# Patient Record
Sex: Male | Born: 1975 | Race: Black or African American | Hispanic: No | Marital: Single | Smoking: Current every day smoker
Health system: Southern US, Community
[De-identification: ages and names within clinical notes are randomized; demographics above are authoritative.]

## PROBLEM LIST (undated history)

## (undated) DIAGNOSIS — G43909 Migraine, unspecified, not intractable, without status migrainosus: Secondary | ICD-10-CM

## (undated) HISTORY — PX: LYMPH NODE DISSECTION: SHX5087

## (undated) HISTORY — PX: HERNIA REPAIR: SHX51

---

## 2009-07-31 ENCOUNTER — Emergency Department: Payer: Self-pay | Admitting: Emergency Medicine

## 2009-07-31 IMAGING — CR DG CHEST 2V
1 series · 2 of 2 positions shown · non-contrast
Comparison: none

REASON FOR EXAM: chest pain
COMMENTS:   May transport without cardiac monitor

[Series 1: view not recorded · 0.17mm/px · 2 of 2 slices shown]
[im 1/2]
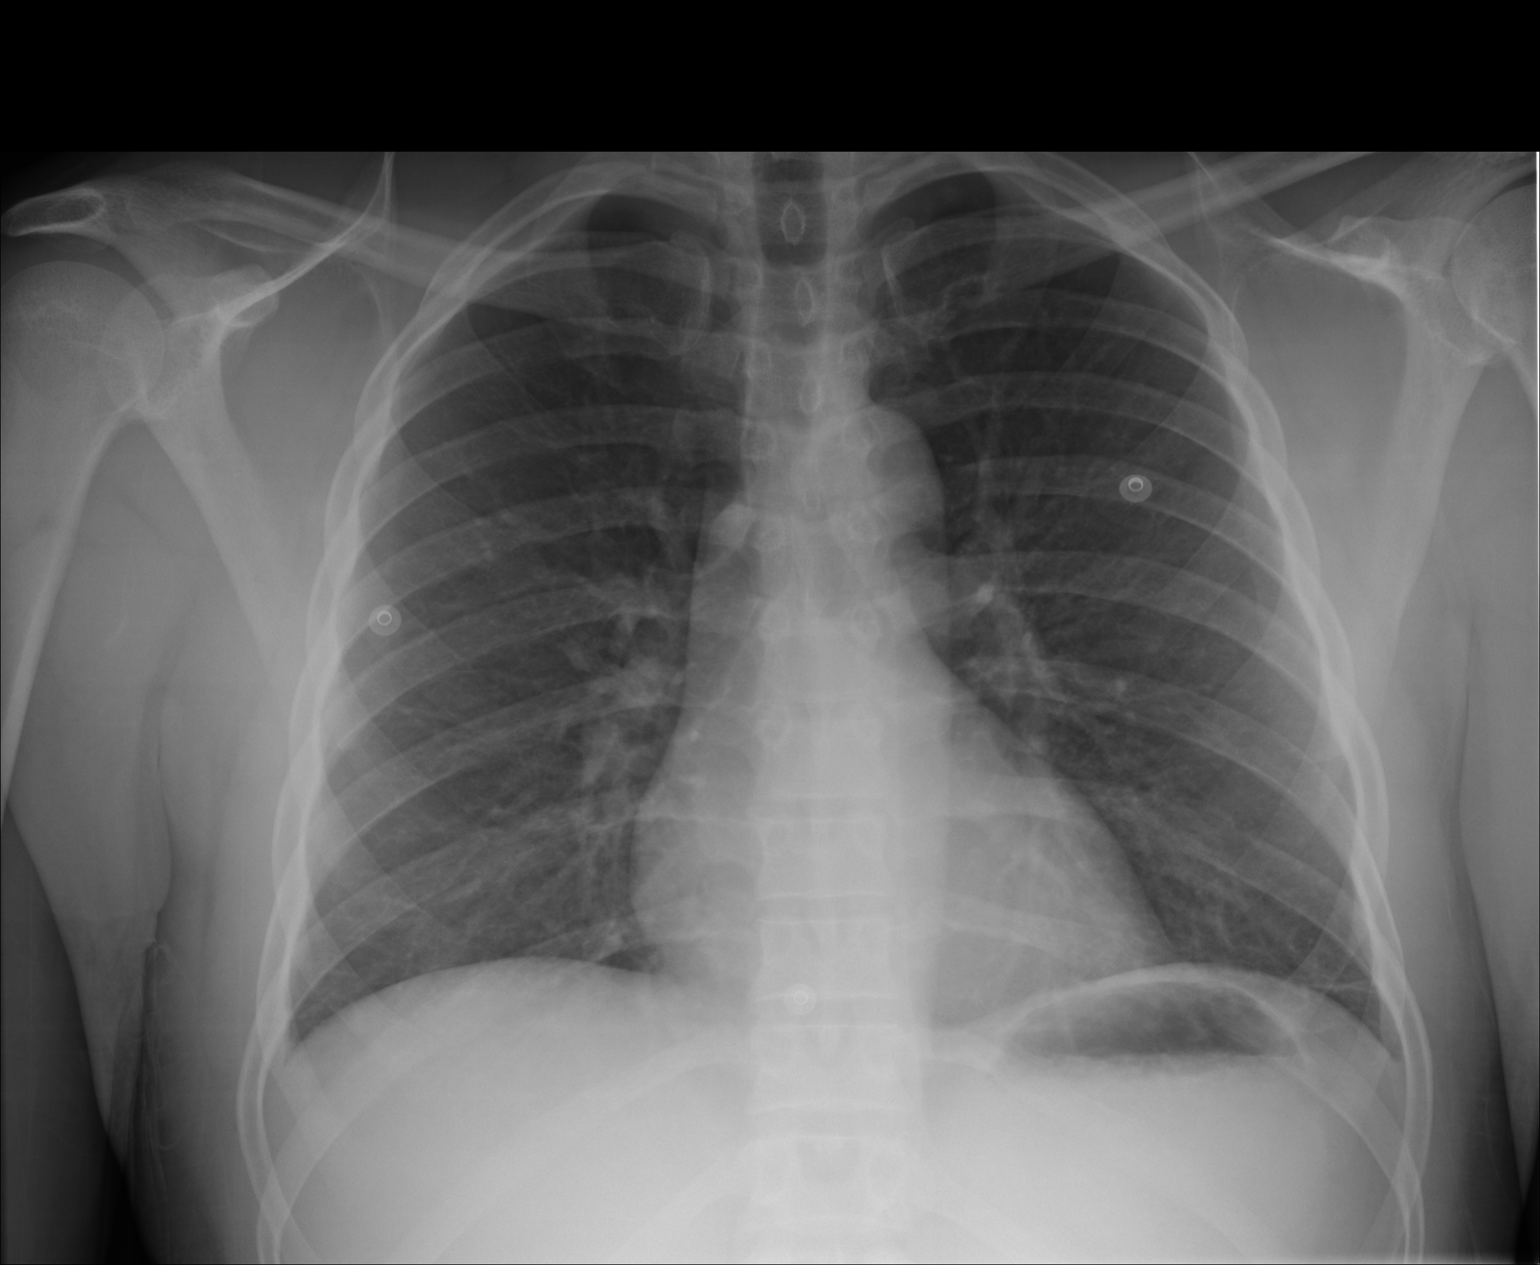
[im 2/2]
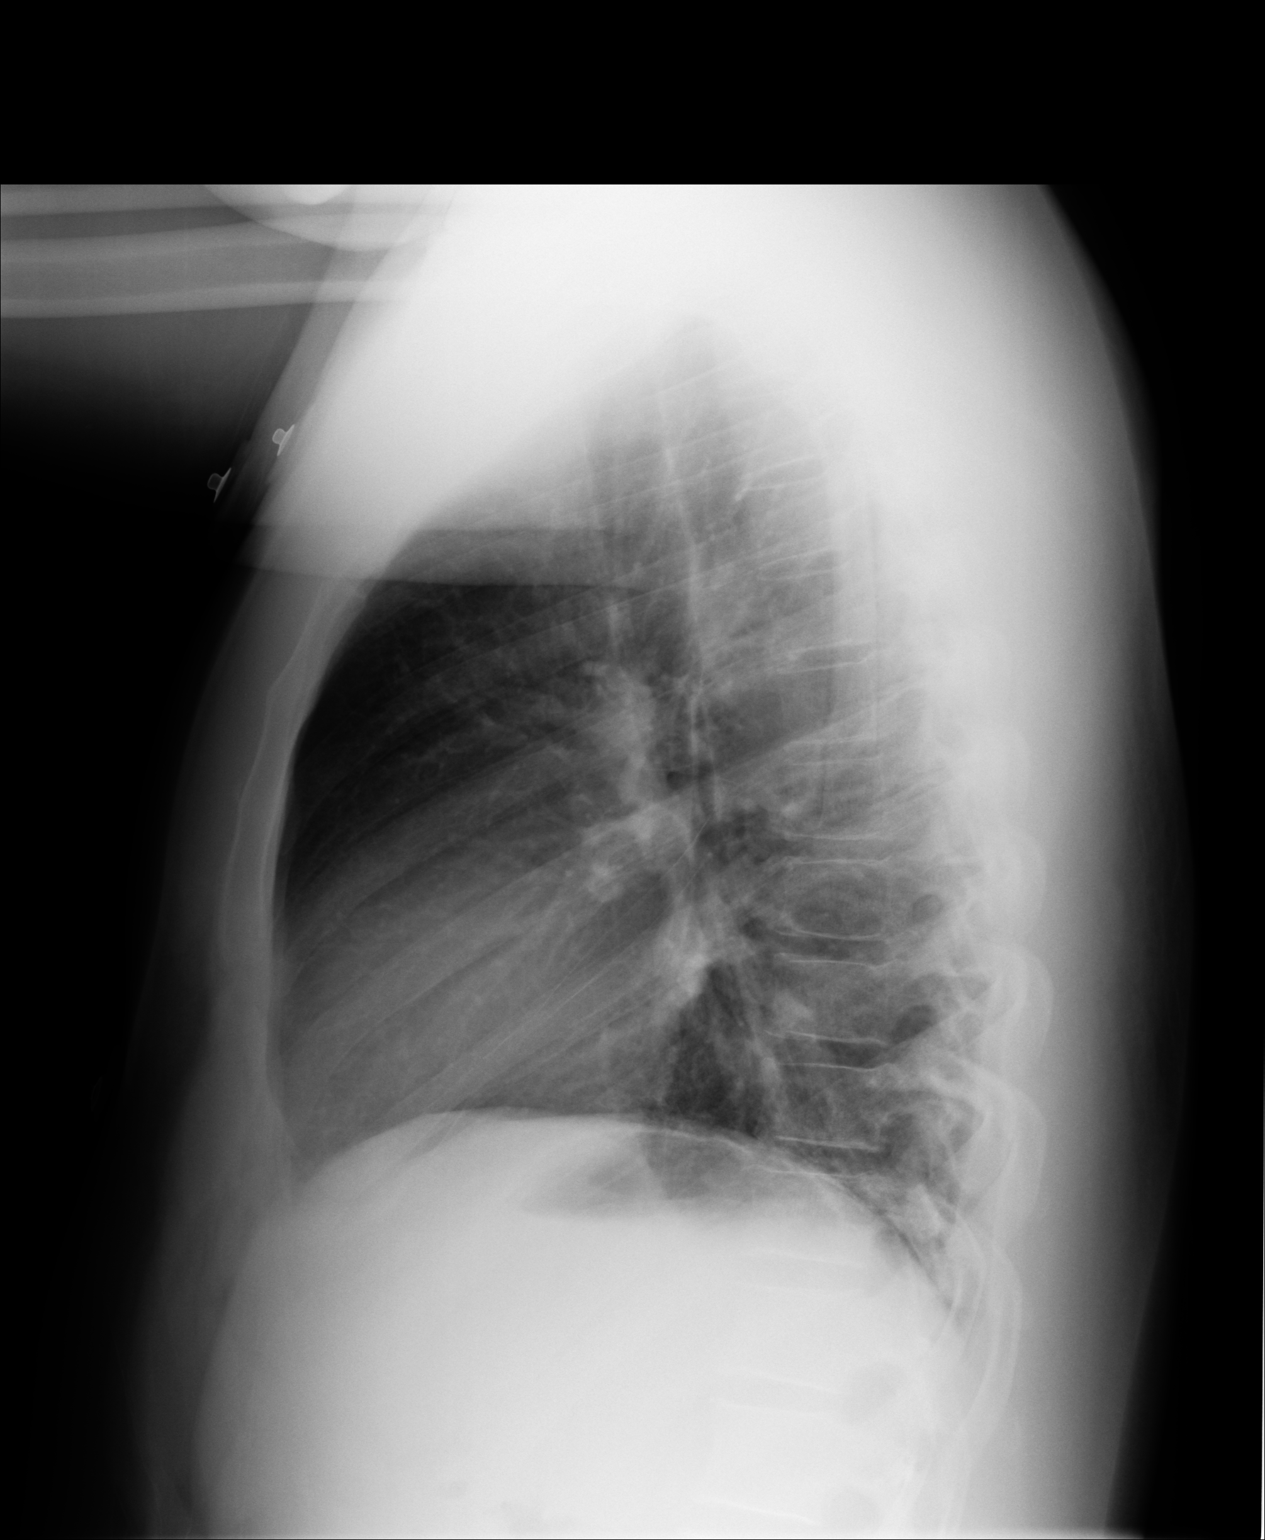

[2 of 2 positions shown; findings below may reference images not displayed]

PROCEDURE:     DXR - DXR CHEST PA (OR AP) AND LATERAL  - [DATE]  [DATE]

RESULT:     There is a transverse linear density at the left base compatible
with minimal discoid atelectasis or a fibrotic strand. No pneumonia,
pneumothorax or pleural effusion is seen. Heart size is normal. The
mediastinal and osseous structures show no acute changes.
IMPRESSION: 1.     No acute changes are identified.

## 2009-12-20 ENCOUNTER — Ambulatory Visit: Payer: Self-pay | Admitting: Gastroenterology

## 2010-01-02 ENCOUNTER — Emergency Department: Payer: Self-pay | Admitting: Emergency Medicine

## 2011-02-06 ENCOUNTER — Emergency Department: Payer: Self-pay | Admitting: Emergency Medicine

## 2011-02-06 IMAGING — CR DG SHOULDER 3+V*R*
1 series · 3 of 3 positions shown · non-contrast
Comparison: none

REASON FOR EXAM: pain
COMMENTS:

PROCEDURE:     DXR - DXR SHOULDER RIGHT COMPLETE  - [DATE]  [DATE]
RESULT:     No fracture, dislocation or other acute bony abnormality is
identified. No lytic or blastic lesions are seen.

[Series 1: internal rotate · 0.17mm/px · 3 of 3 slices shown]
[im 1/3]
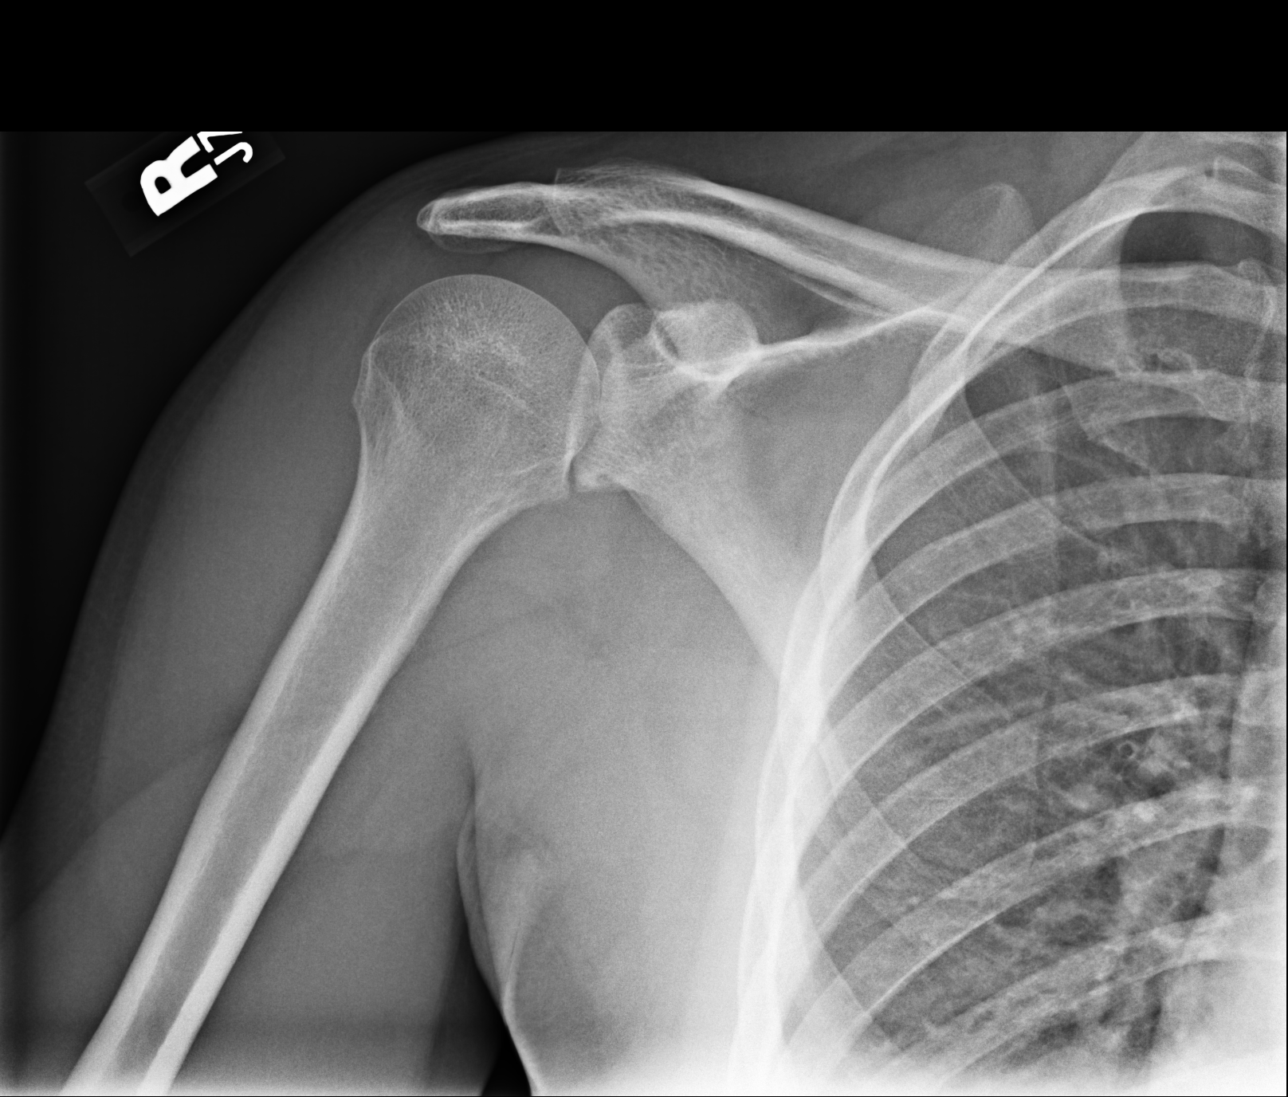
[im 2/3]
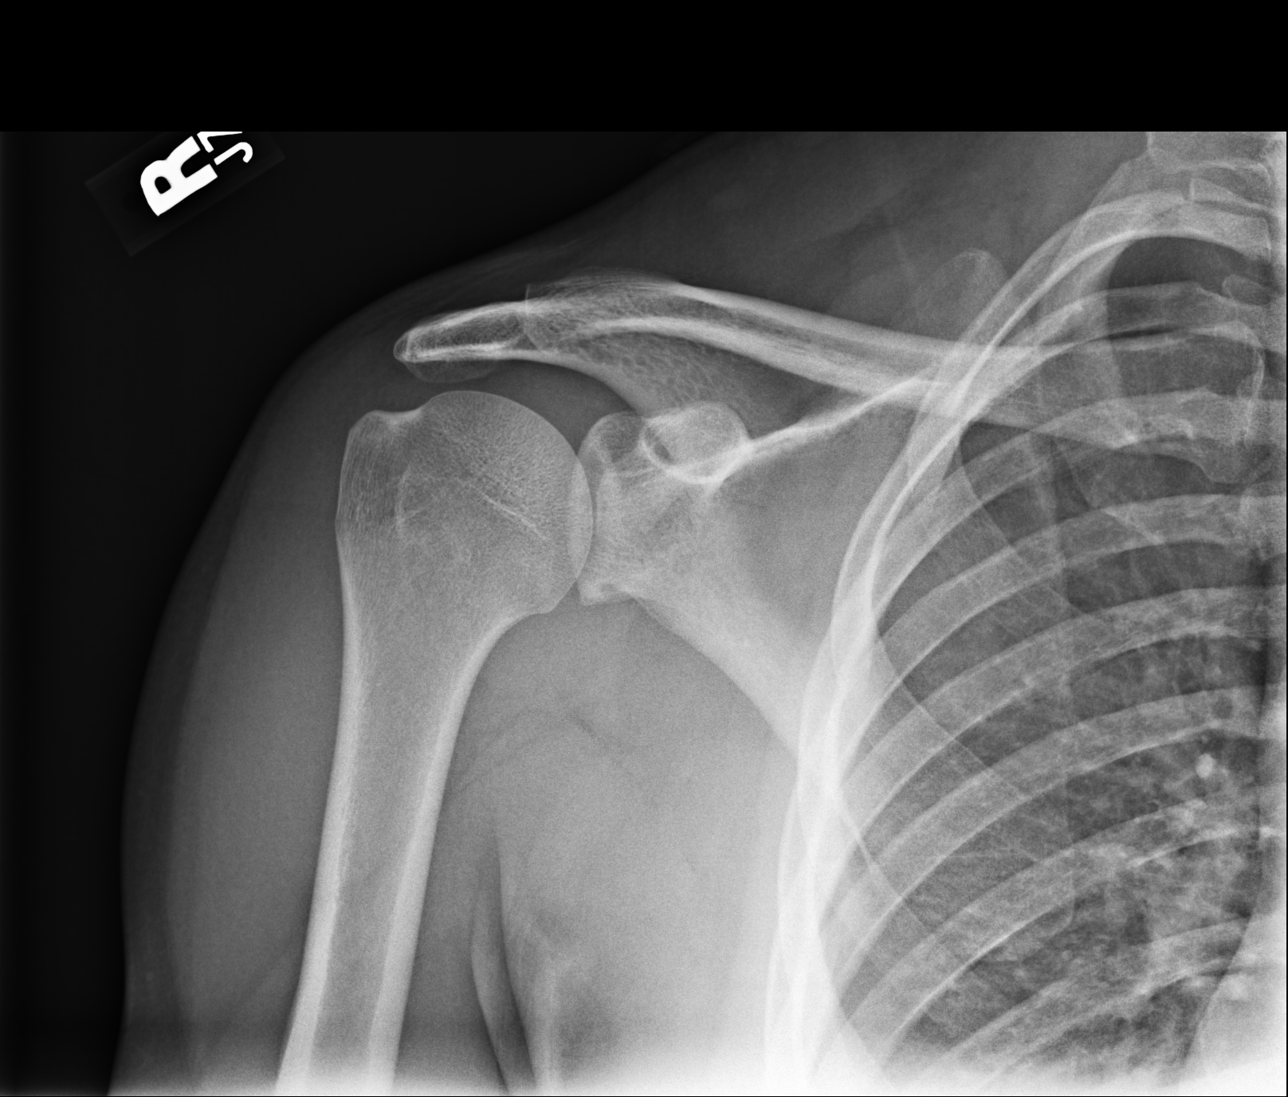
[im 3/3]
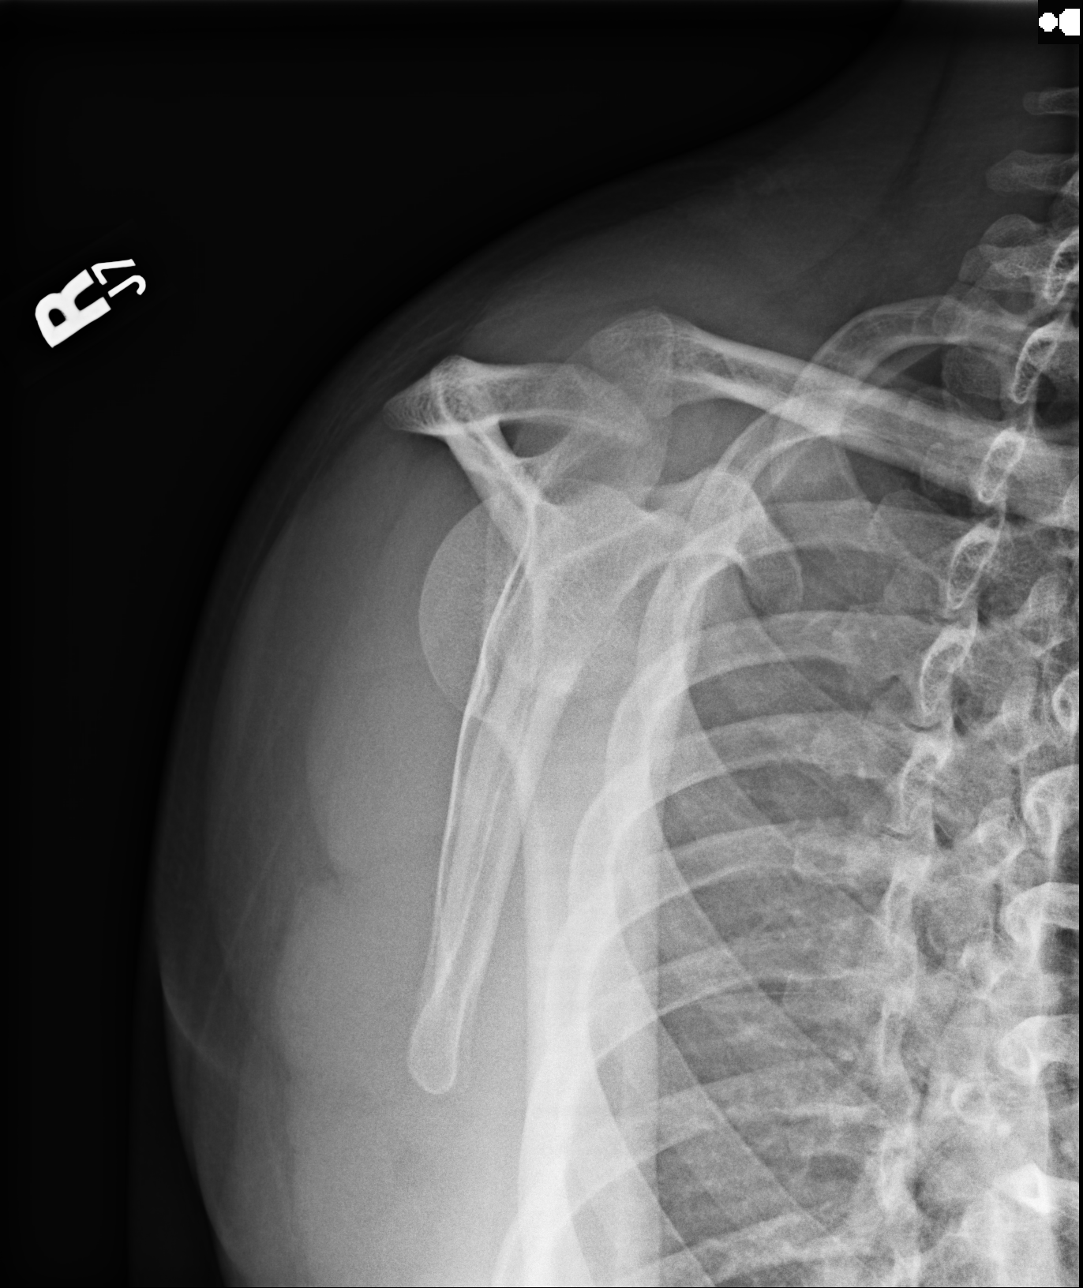

[3 of 3 positions shown; findings below may reference images not displayed]

IMPRESSION: No acute changes are identified.

## 2011-08-05 ENCOUNTER — Emergency Department: Payer: Self-pay | Admitting: Emergency Medicine

## 2011-08-05 LAB — CBC
HGB: 13.8 g/dL (ref 13.0–18.0)
RBC: 4.34 10*6/uL — ABNORMAL LOW (ref 4.40–5.90)
WBC: 10.9 10*3/uL — ABNORMAL HIGH (ref 3.8–10.6)

## 2011-08-05 LAB — URINALYSIS, COMPLETE
Blood: NEGATIVE
Glucose,UR: NEGATIVE mg/dL (ref 0–75)
Nitrite: NEGATIVE
Ph: 6 (ref 4.5–8.0)
Protein: NEGATIVE
RBC,UR: 3 /HPF (ref 0–5)

## 2011-08-05 LAB — COMPREHENSIVE METABOLIC PANEL
Albumin: 3.8 g/dL (ref 3.4–5.0)
Alkaline Phosphatase: 82 U/L (ref 50–136)
BUN: 12 mg/dL (ref 7–18)
Calcium, Total: 9.2 mg/dL (ref 8.5–10.1)
EGFR (Non-African Amer.): >60
Glucose: 86 mg/dL (ref 65–99)
Osmolality: 284 (ref 275–301)
Potassium: 3.7 mmol/L (ref 3.5–5.1)
SGOT(AST): 22 U/L (ref 15–37)
SGPT (ALT): 38 U/L (ref 12–78)
Total Protein: 7.6 g/dL (ref 6.4–8.2)

## 2011-08-05 IMAGING — US US PELVIS LIMITED
1 series · 14 of 25 positions shown · non-contrast
Comparison: none

REASON FOR EXAM: bilateral testicular pain
COMMENTS:

PROCEDURE:     US  - US TESTICULAR  - [DATE]  [DATE]
RESULT:     Comparison: None
TECHNIQUE: Multiple gray-scale, color-flow Doppler, and spectral waveform
tracings of the testicles and testicular vasculature are presented for
review.

[Series 1: us pelvis limited · 0.08mm/px · 14 of 49 slices shown]
[im 1/49]
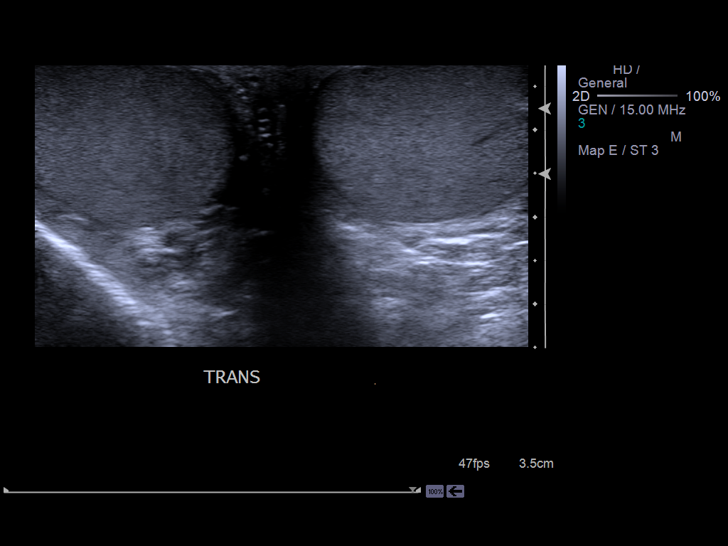
[im 5/49]
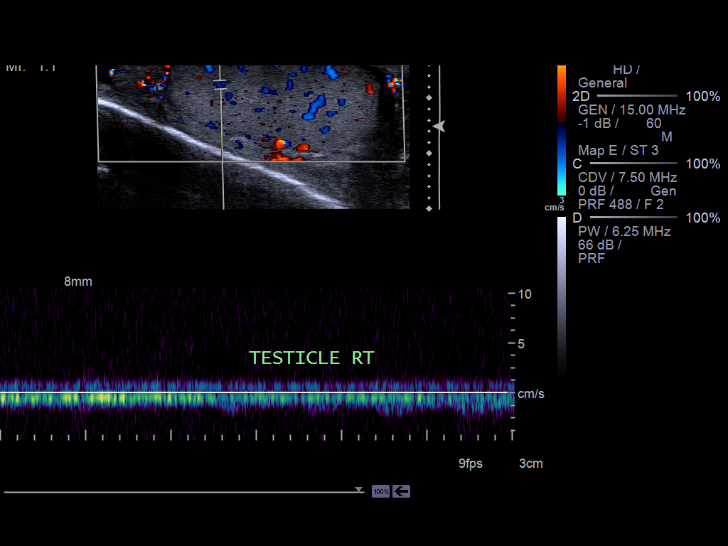
[im 9/49]
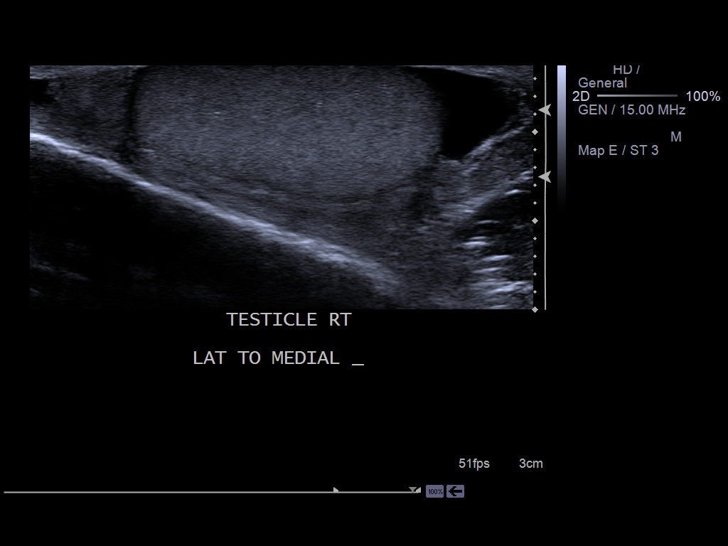
[im 13/49]
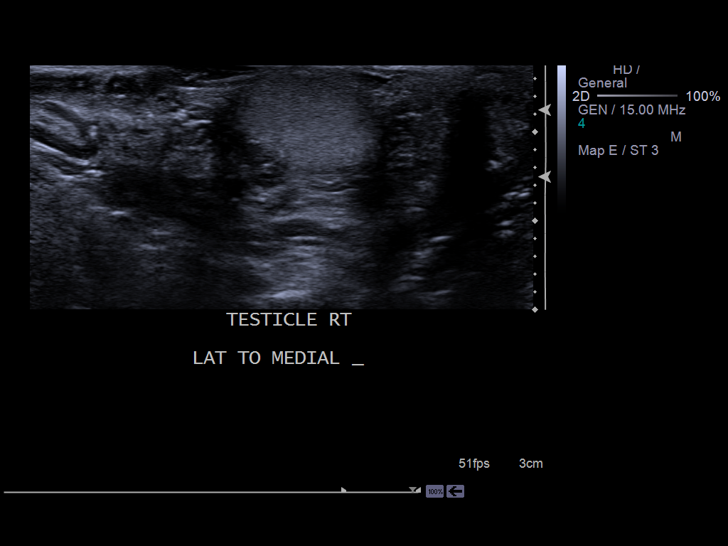
[im 17/49]
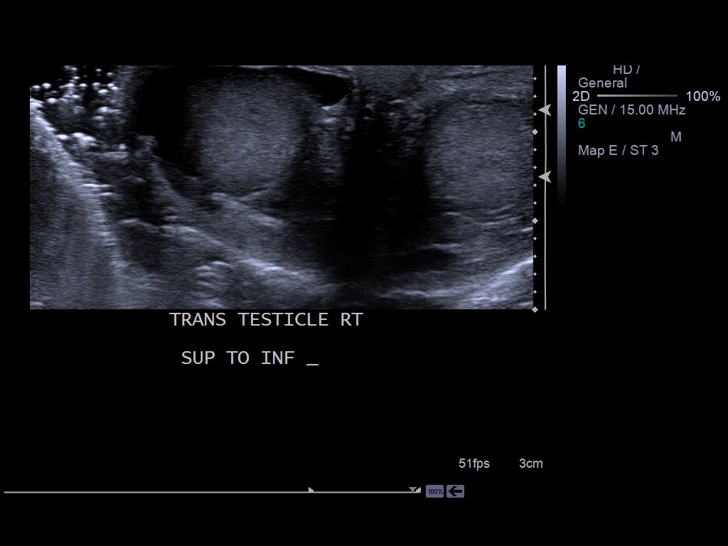
[im 19/49]
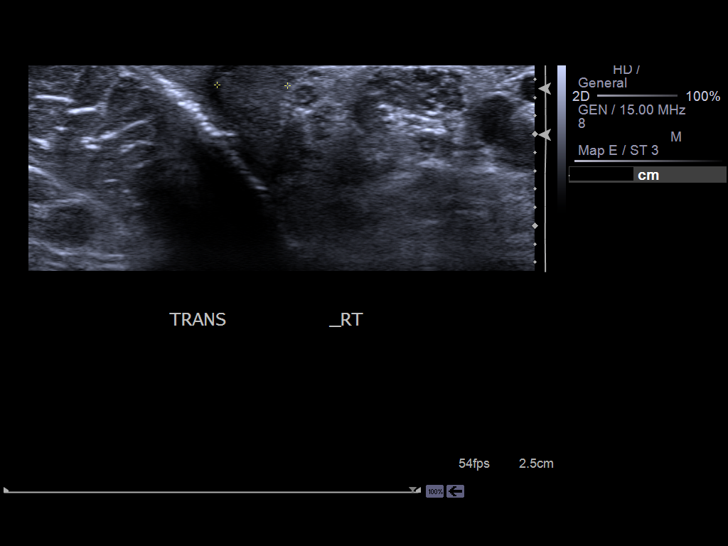
[im 23/49]
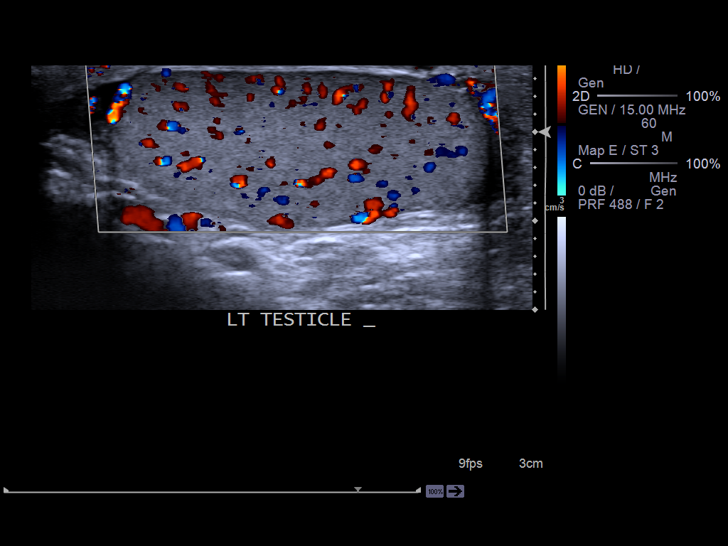
[im 27/49]
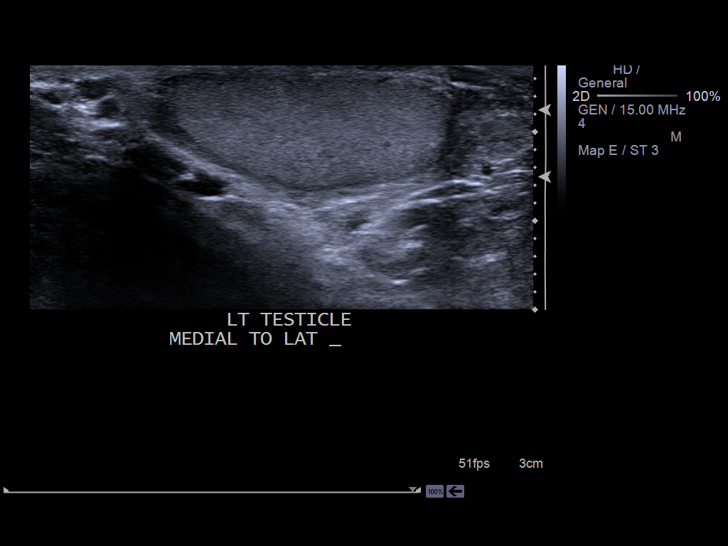
[im 31/49]
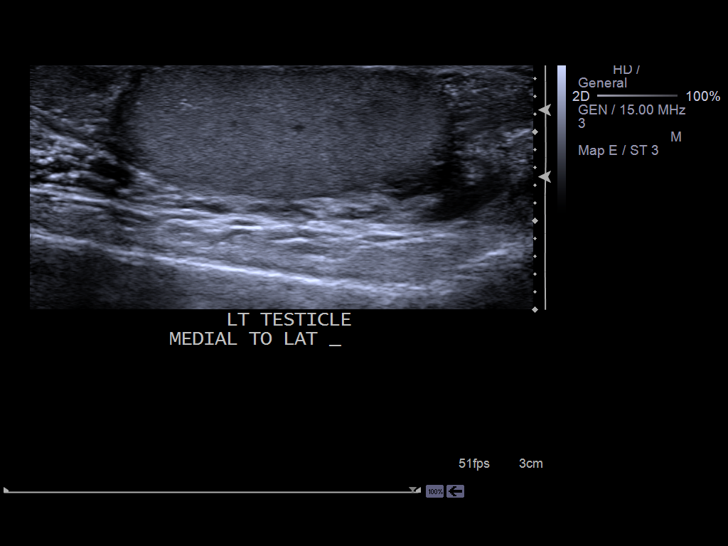
[im 33/49]
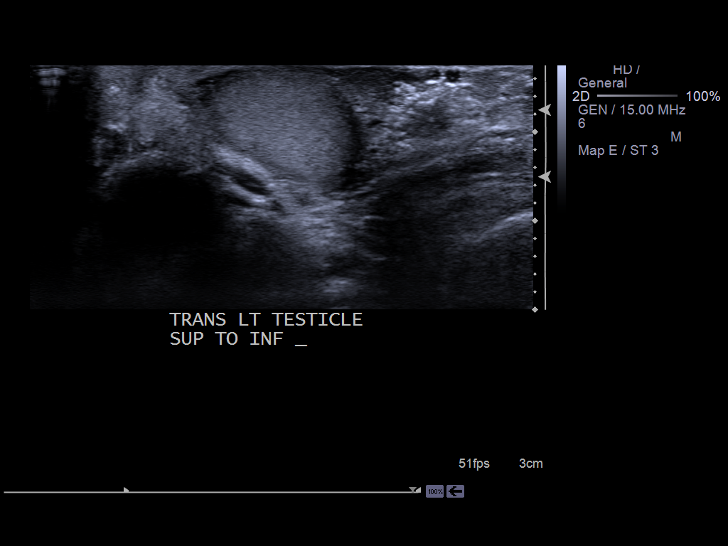
[im 37/49]
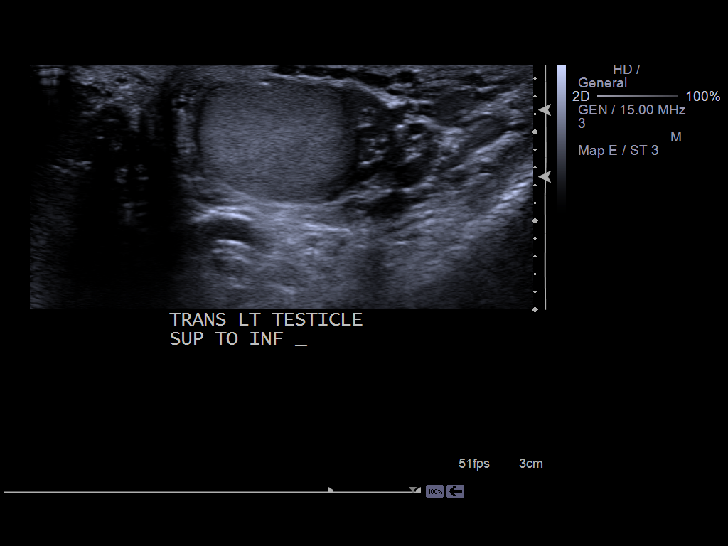
[im 41/49]
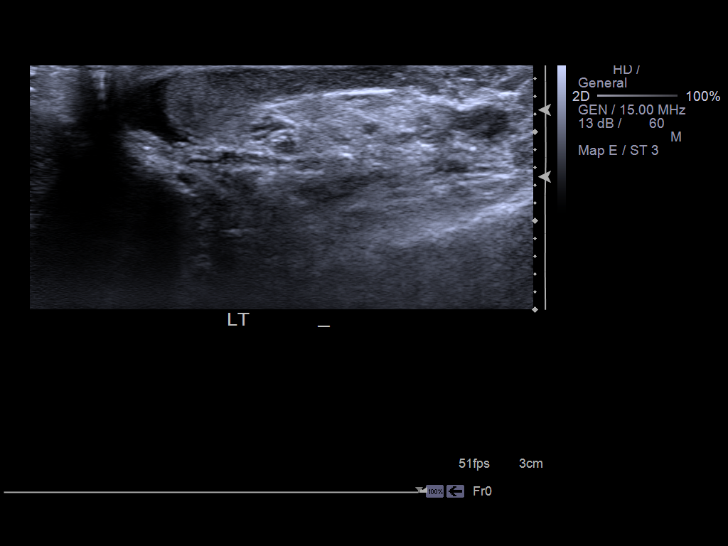
[im 45/49]
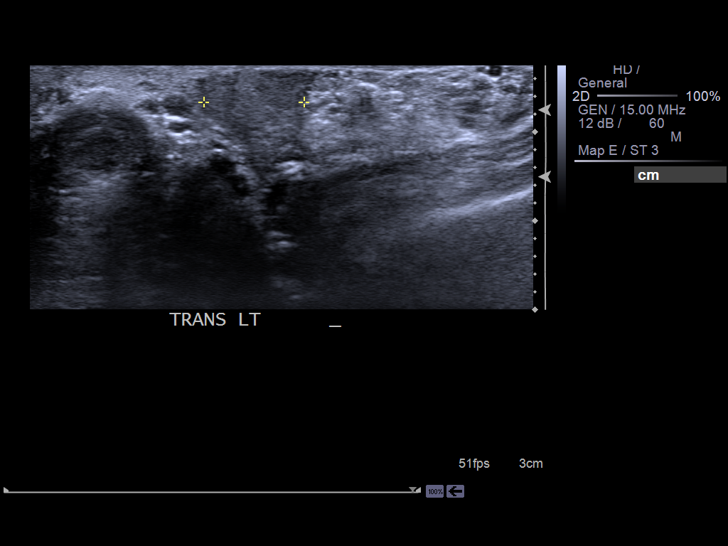
[im 49/49]
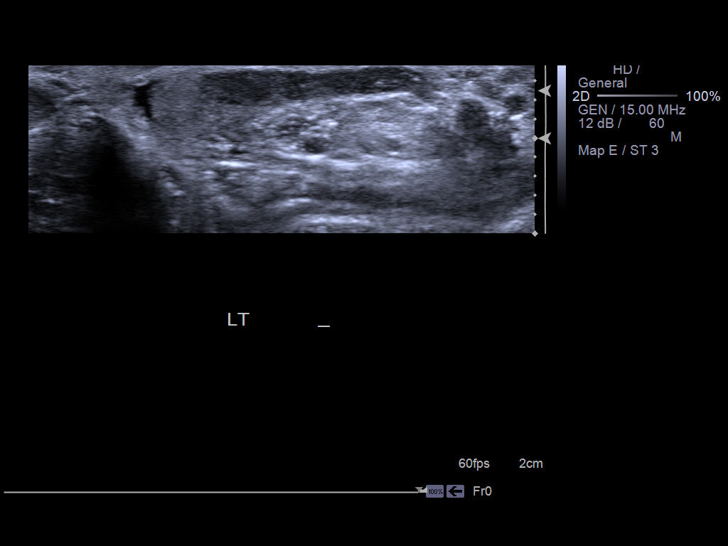

[14 of 25 positions shown; findings below may reference images not displayed]

FINDINGS: The right testicle measures 4.1 x 2.6 x 1.9 cm. The left testicle measures
4.2 x 2.6 x 1.8 cm. The testicles are homogeneous in echotexture. Arterial
and venous spectral Doppler waveforms are demonstrated in the bilateral
testicles. There are small epididymal cysts bilaterally. The left epididymis
is somewhat heterogeneous and with a suggestion of hypervascularity with
color Doppler flow imaging. This raises the possibility of epididymitis.
IMPRESSION: 1. No evidence of testicular torsion.
2. There is suggestion of heterogeneity and hypervascularity of the left
epididymis. This raises the possibility of epididymitis. Correlate
clinically.

[REDACTED]

## 2011-08-05 IMAGING — CT CT STONE STUDY
1 of 2 series · 15 of 32 positions shown, 19 images · non-contrast
Comparison: none

REASON FOR EXAM: flank pain, hematuria
COMMENTS:

PROCEDURE:     CT  - CT ABDOMEN /PELVIS WO (STONE)  - [DATE]  [DATE]
RESULT:     Comparison: None
TECHNIQUE: Multiple axial images from the lung bases to the symphysis pubis
were obtained without oral and without intravenous contrast.

[Series 2: 3mm soft tissue · axial · 0.65mm/px · z∈[-714,-272]mm · 15 of 161 slices shown, 19 images]
[im 7/161  soft-tissue]
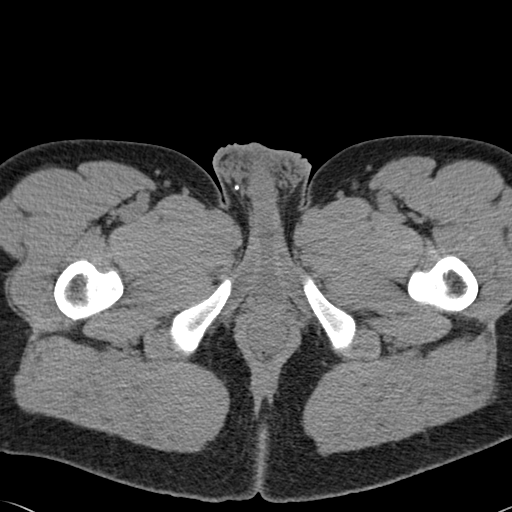
[im 7/161  bone]
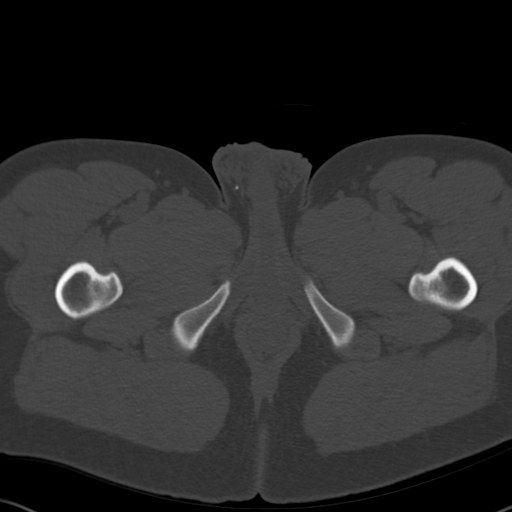
[im 21/161  soft-tissue]
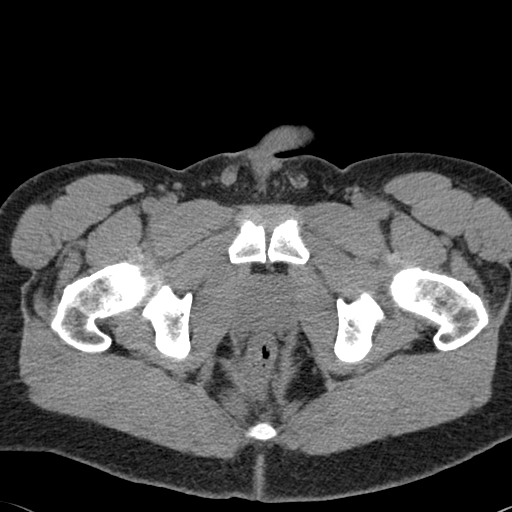
[im 34/161  soft-tissue]
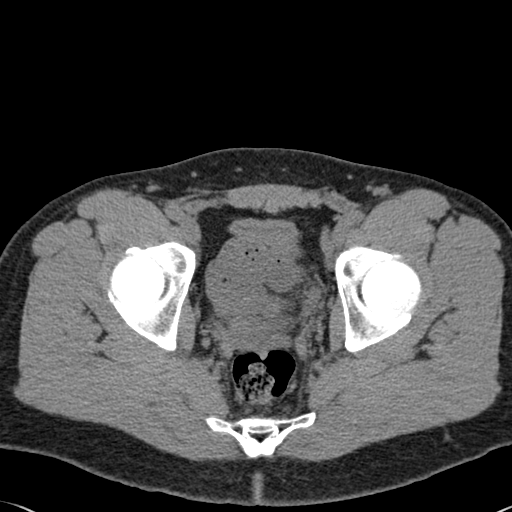
[im 47/161  soft-tissue]
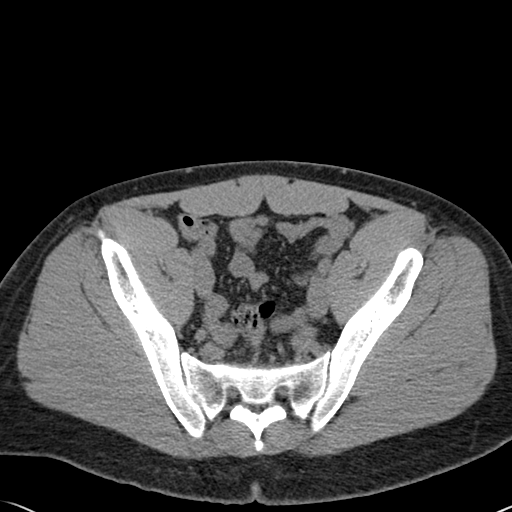
[im 54/161  soft-tissue]
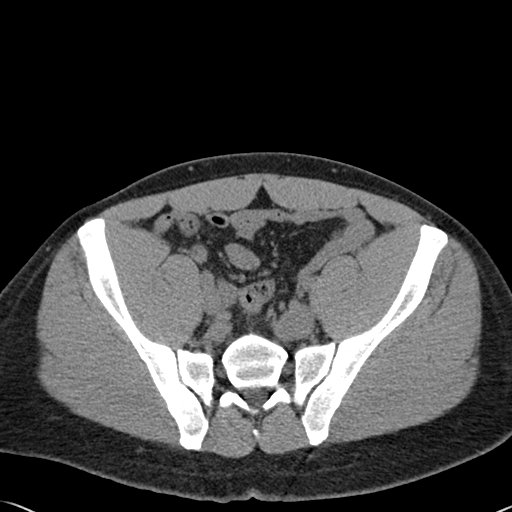
[im 67/161  soft-tissue]
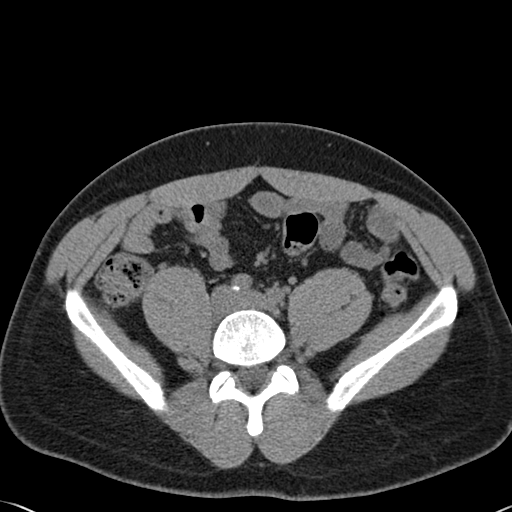
[im 81/161  soft-tissue]
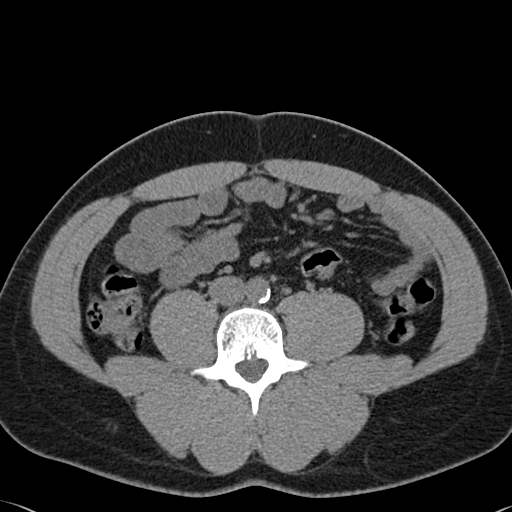
[im 94/161  soft-tissue]
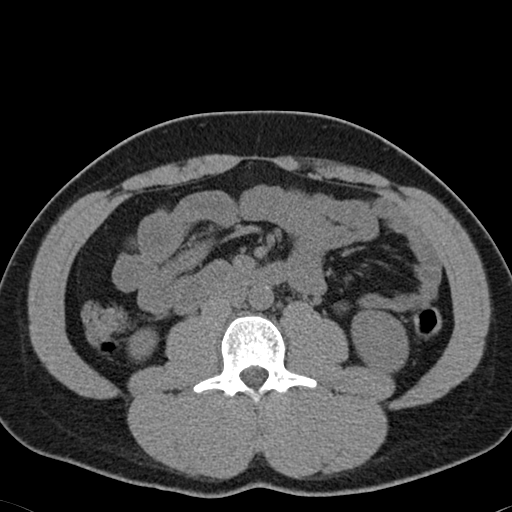
[im 107/161  soft-tissue]
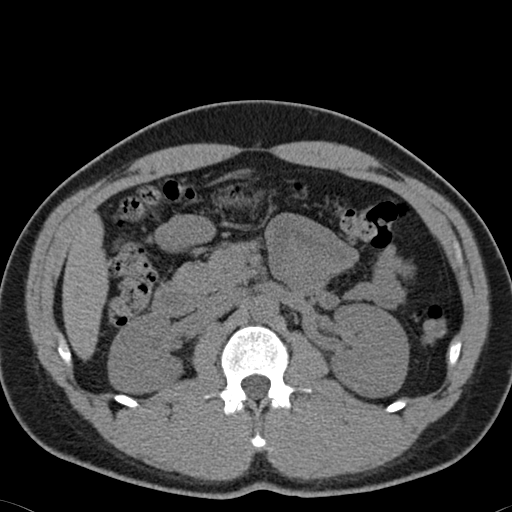
[im 107/161  bone]
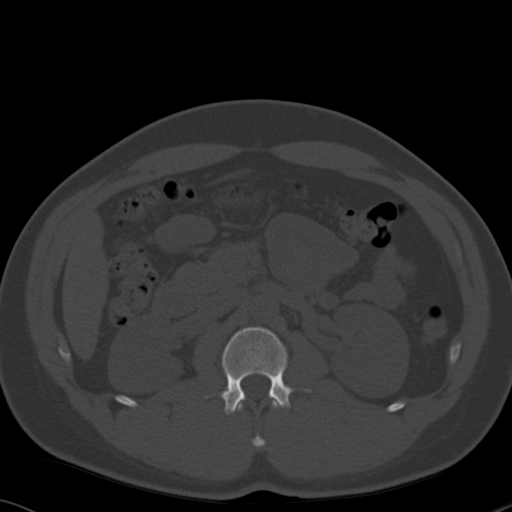
[im 114/161  soft-tissue]
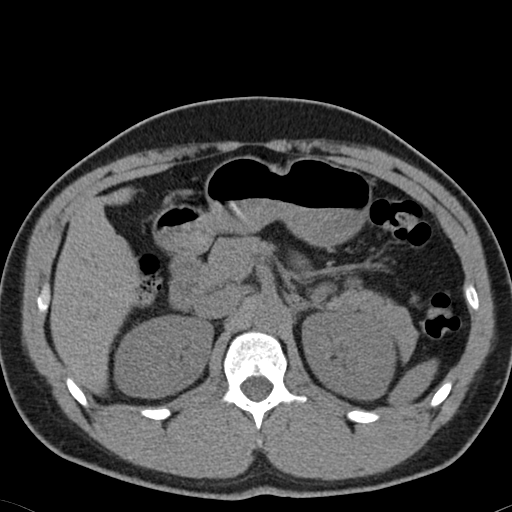
[im 127/161  soft-tissue]
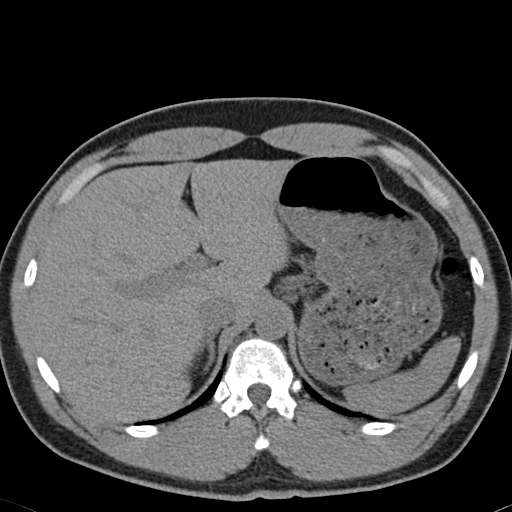
[im 134/161  lung]
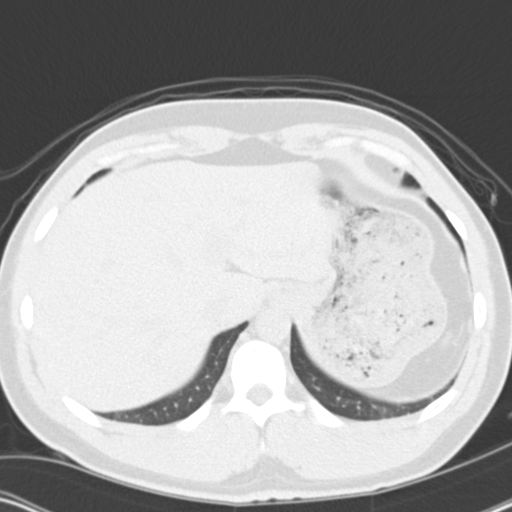
[im 141/161  soft-tissue]
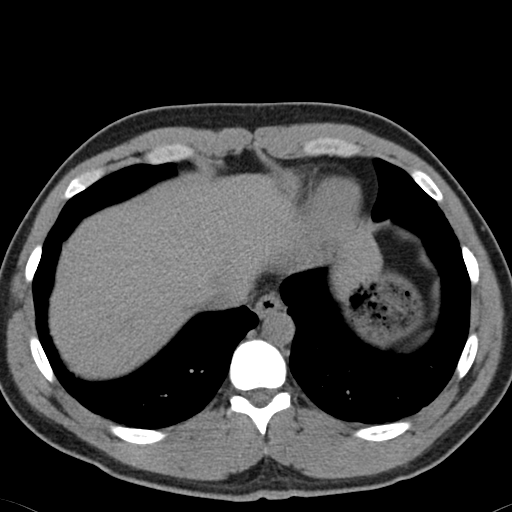
[im 141/161  lung]
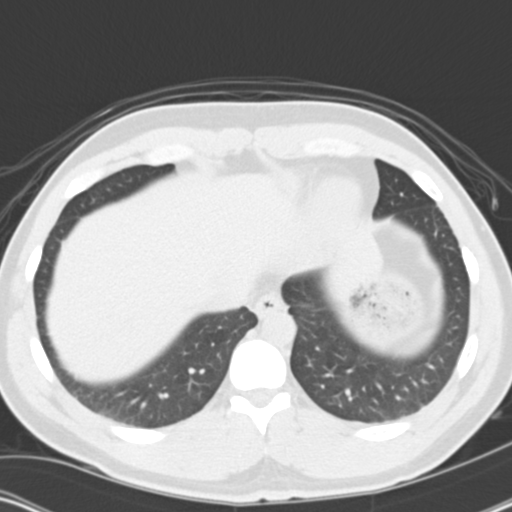
[im 147/161  lung]
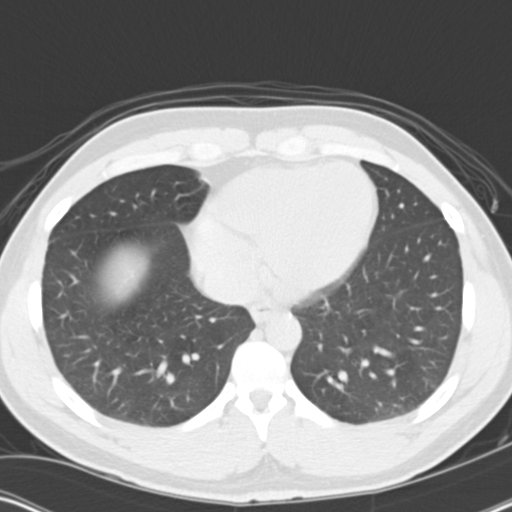
[im 154/161  soft-tissue]
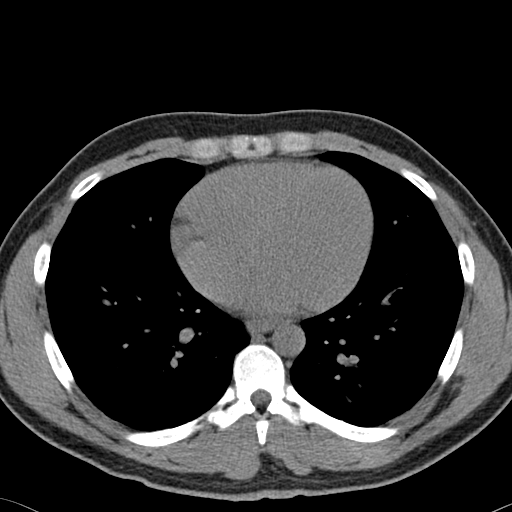
[im 154/161  lung]
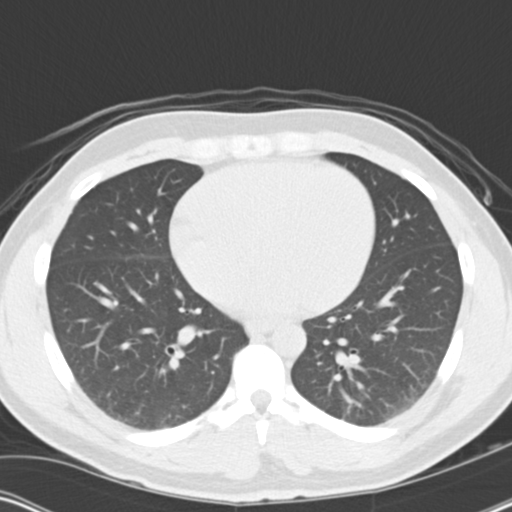

[15 of 32 positions shown; findings below may reference images not displayed]

FINDINGS: Mild basilar opacities are likely secondary to atelectasis.

Lack of intravenous contrast limits evaluation of the solid abdominal
organs.  Minimal low-attenuation along falciform ligament likely represents
focal fatty deposition. The gallbladder is relatively decompressed. The
spleen, adrenals, and pancreas are unremarkable. No renal calculi or
hydronephrosis. No ureterectasis.

The small and large bowel are normal in caliber. There is a small tubular
structure in the right lower quadrant which is felt to represent a normal
appendix. Vascular calcifications are seen in the abdominal aorta.

No aggressive lytic or sclerotic osseous lesions are identified.
IMPRESSION: No renal calculi or hydronephrosis.

[REDACTED]

## 2011-08-07 LAB — URINE CULTURE

## 2011-11-02 ENCOUNTER — Ambulatory Visit: Payer: Self-pay

## 2011-11-02 IMAGING — CR DG KNEE 1-2V*R*
1 series · 2 of 2 positions shown · non-contrast
Comparison: none

REASON FOR EXAM: knee and feet problem, groin hernia, fax report  [HOSPITAL] [facility]
[PHONE_NUMBER]
COMMENTS:

PROCEDURE:     DXR - DXR KNEE RIGHT AP AND LATERAL  - [DATE]  [DATE]
RESULT:     Comparison: None.

[Series 1: t knee ap right · 0.14mm/px · 2 of 2 slices shown]
[im 1/2]
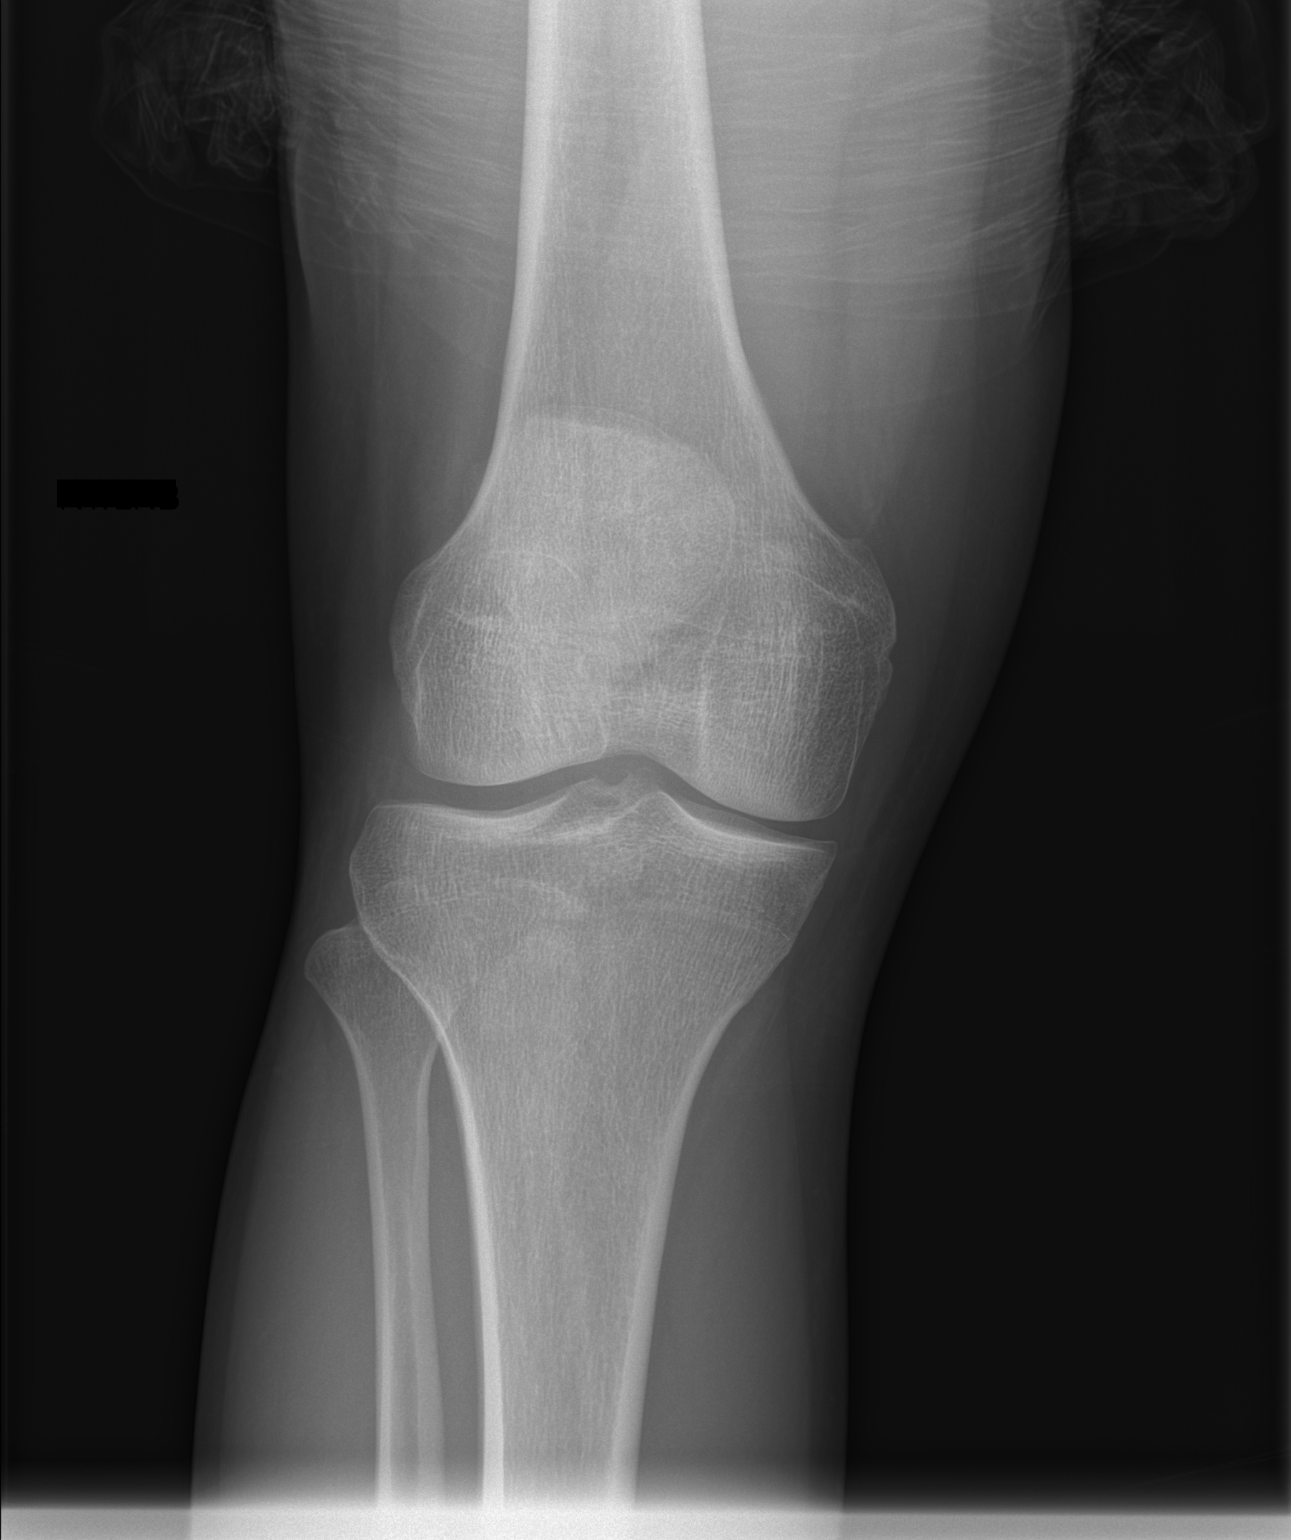
[im 2/2]
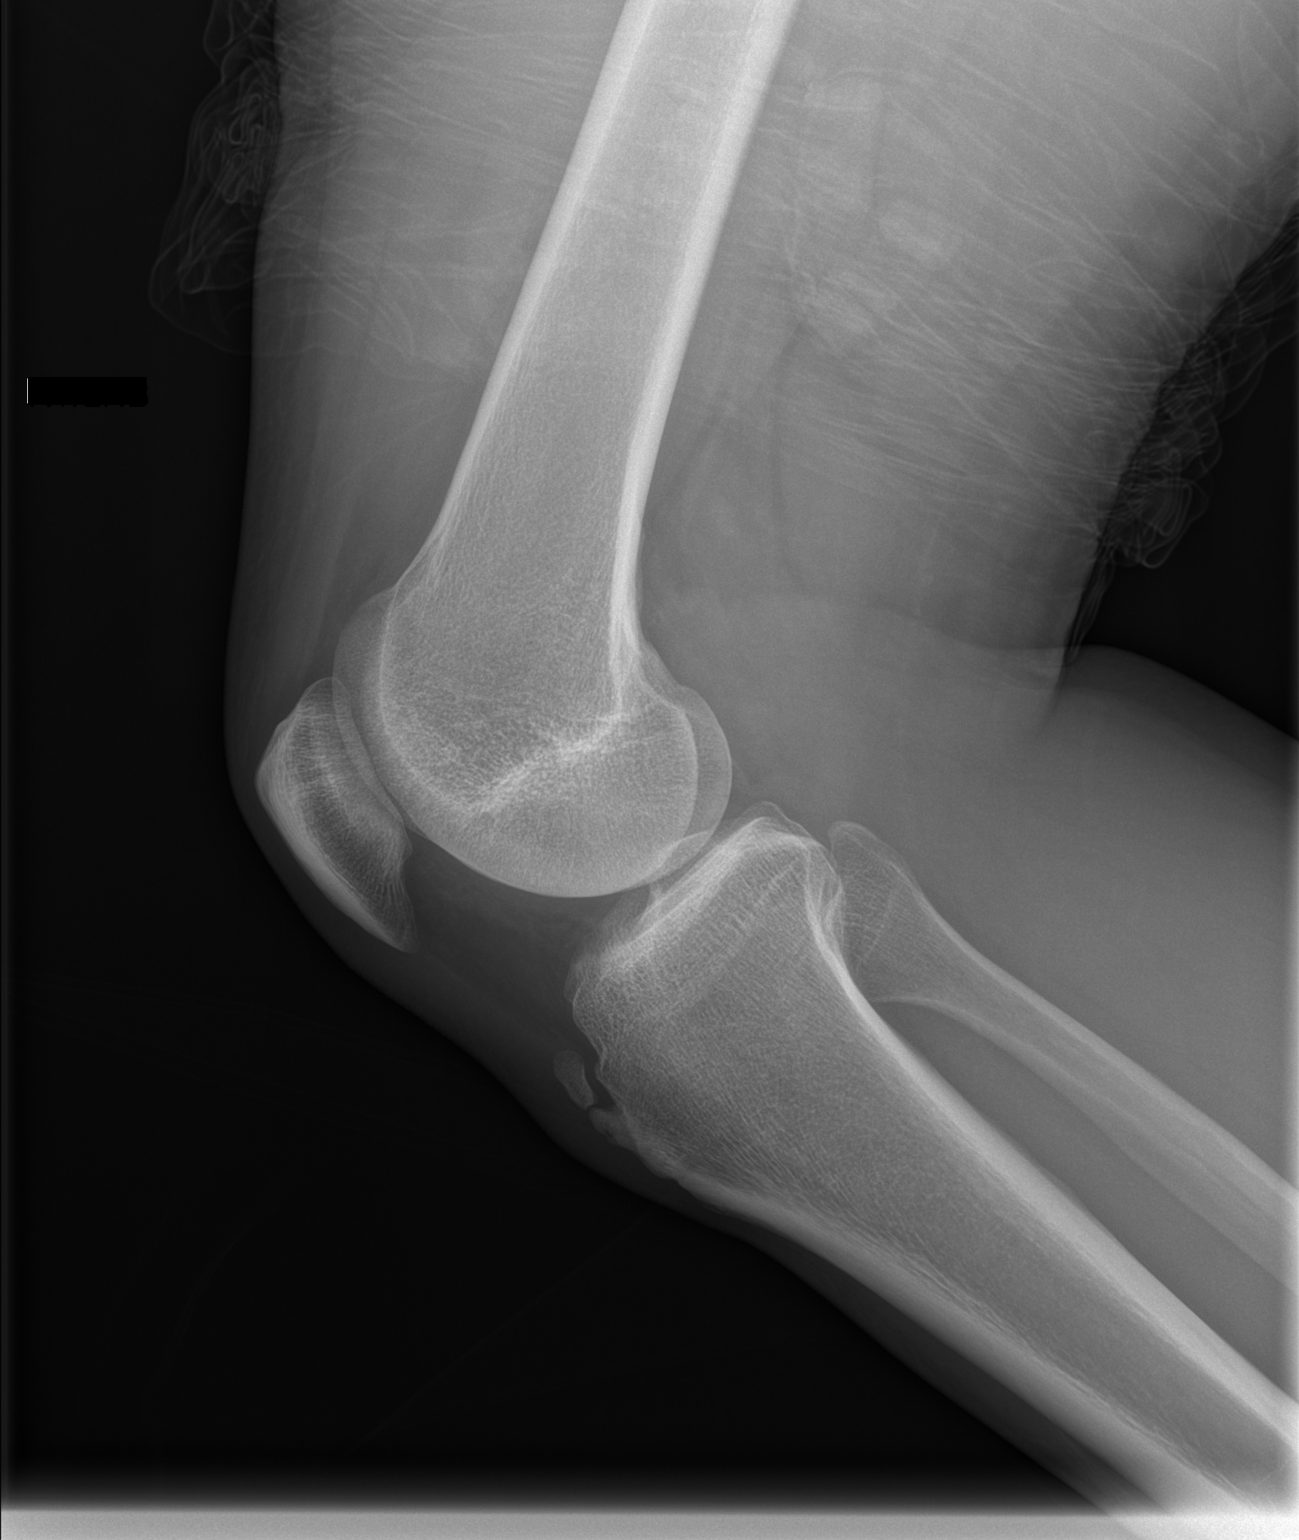

[2 of 2 positions shown; findings below may reference images not displayed]

FINDINGS: No acute fracture. The joint spaces are maintained. No significant effusion.
IMPRESSION: No acute findings.

[REDACTED]

## 2011-11-02 IMAGING — CR DG KNEE 1-2V*L*
1 series · 2 of 2 positions shown · non-contrast
Comparison: none

REASON FOR EXAM: knee and feet problem, groin hernia, fax report  [HOSPITAL] [facility]
[PHONE_NUMBER]
COMMENTS:

PROCEDURE:     DXR - DXR KNEE LEFT AP AND LATERAL  - [DATE]  [DATE]
RESULT:     Comparison: None.

[Series 1: t knee ap left · 0.14mm/px · 2 of 2 slices shown]
[im 1/2]
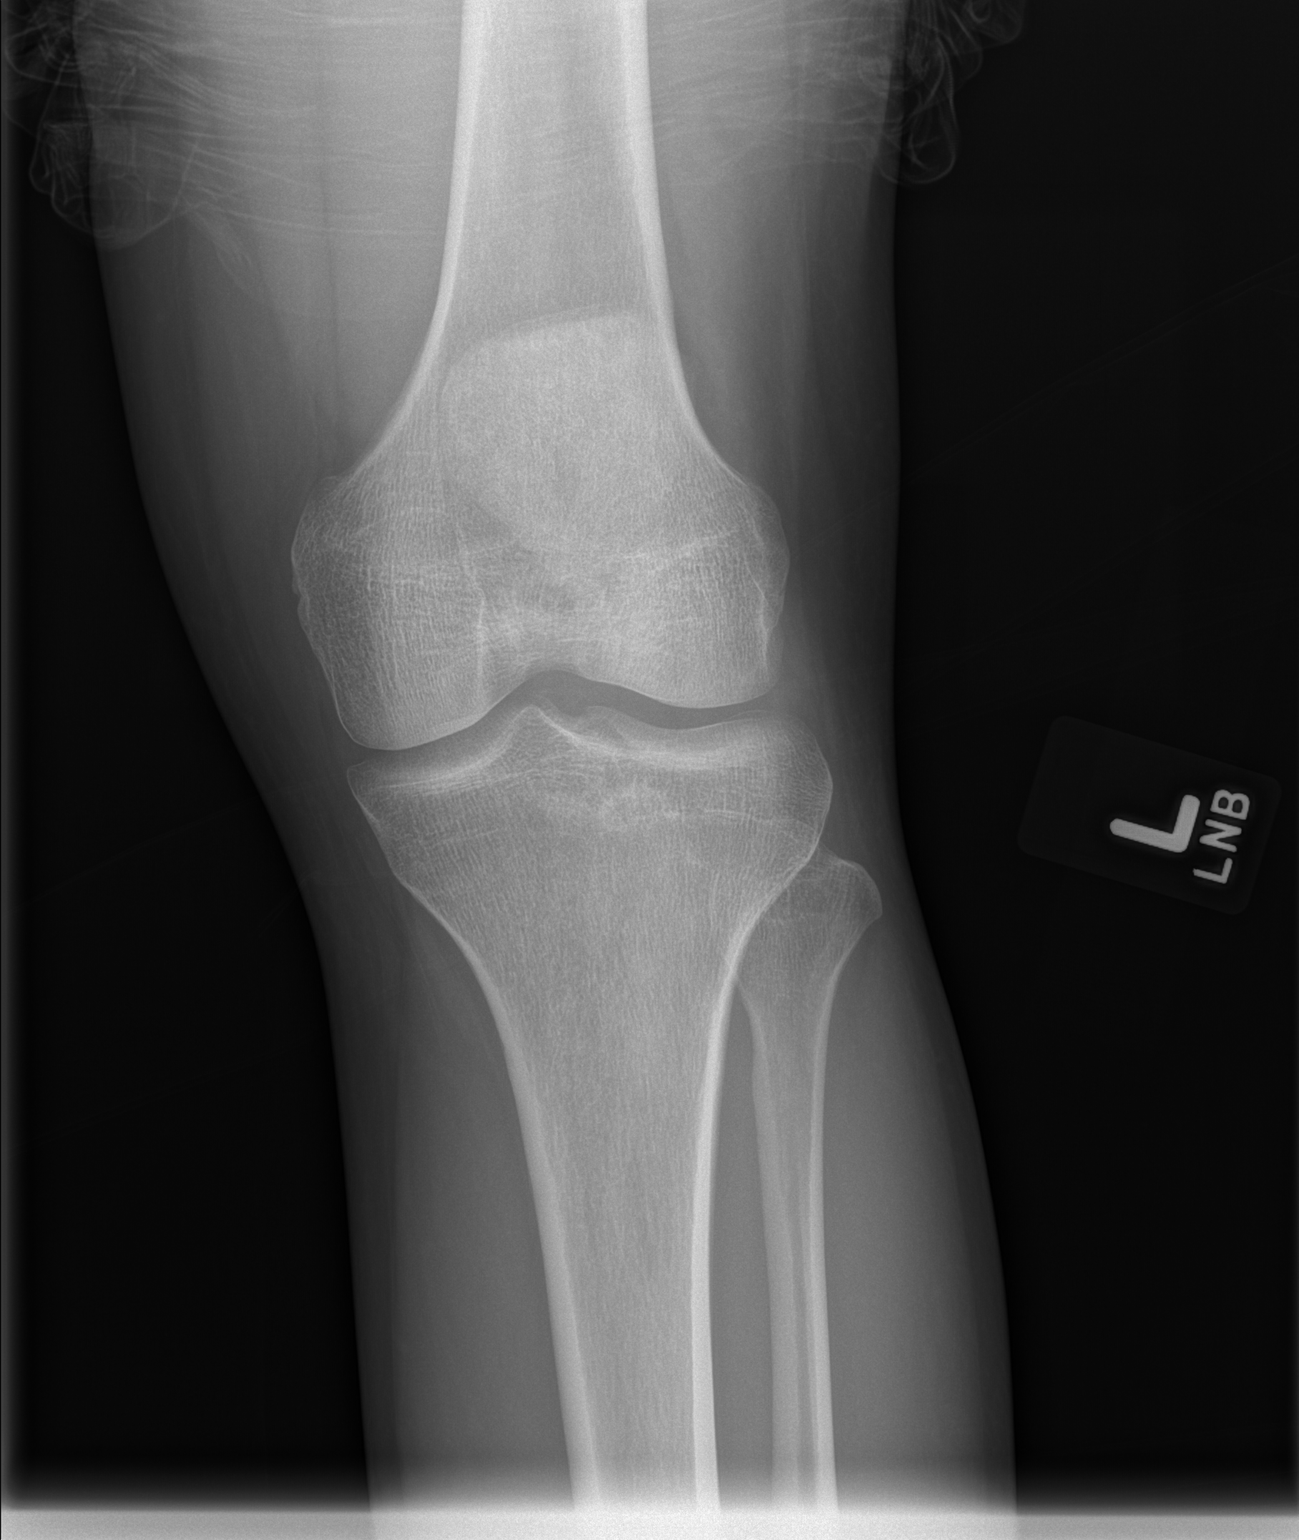
[im 2/2]
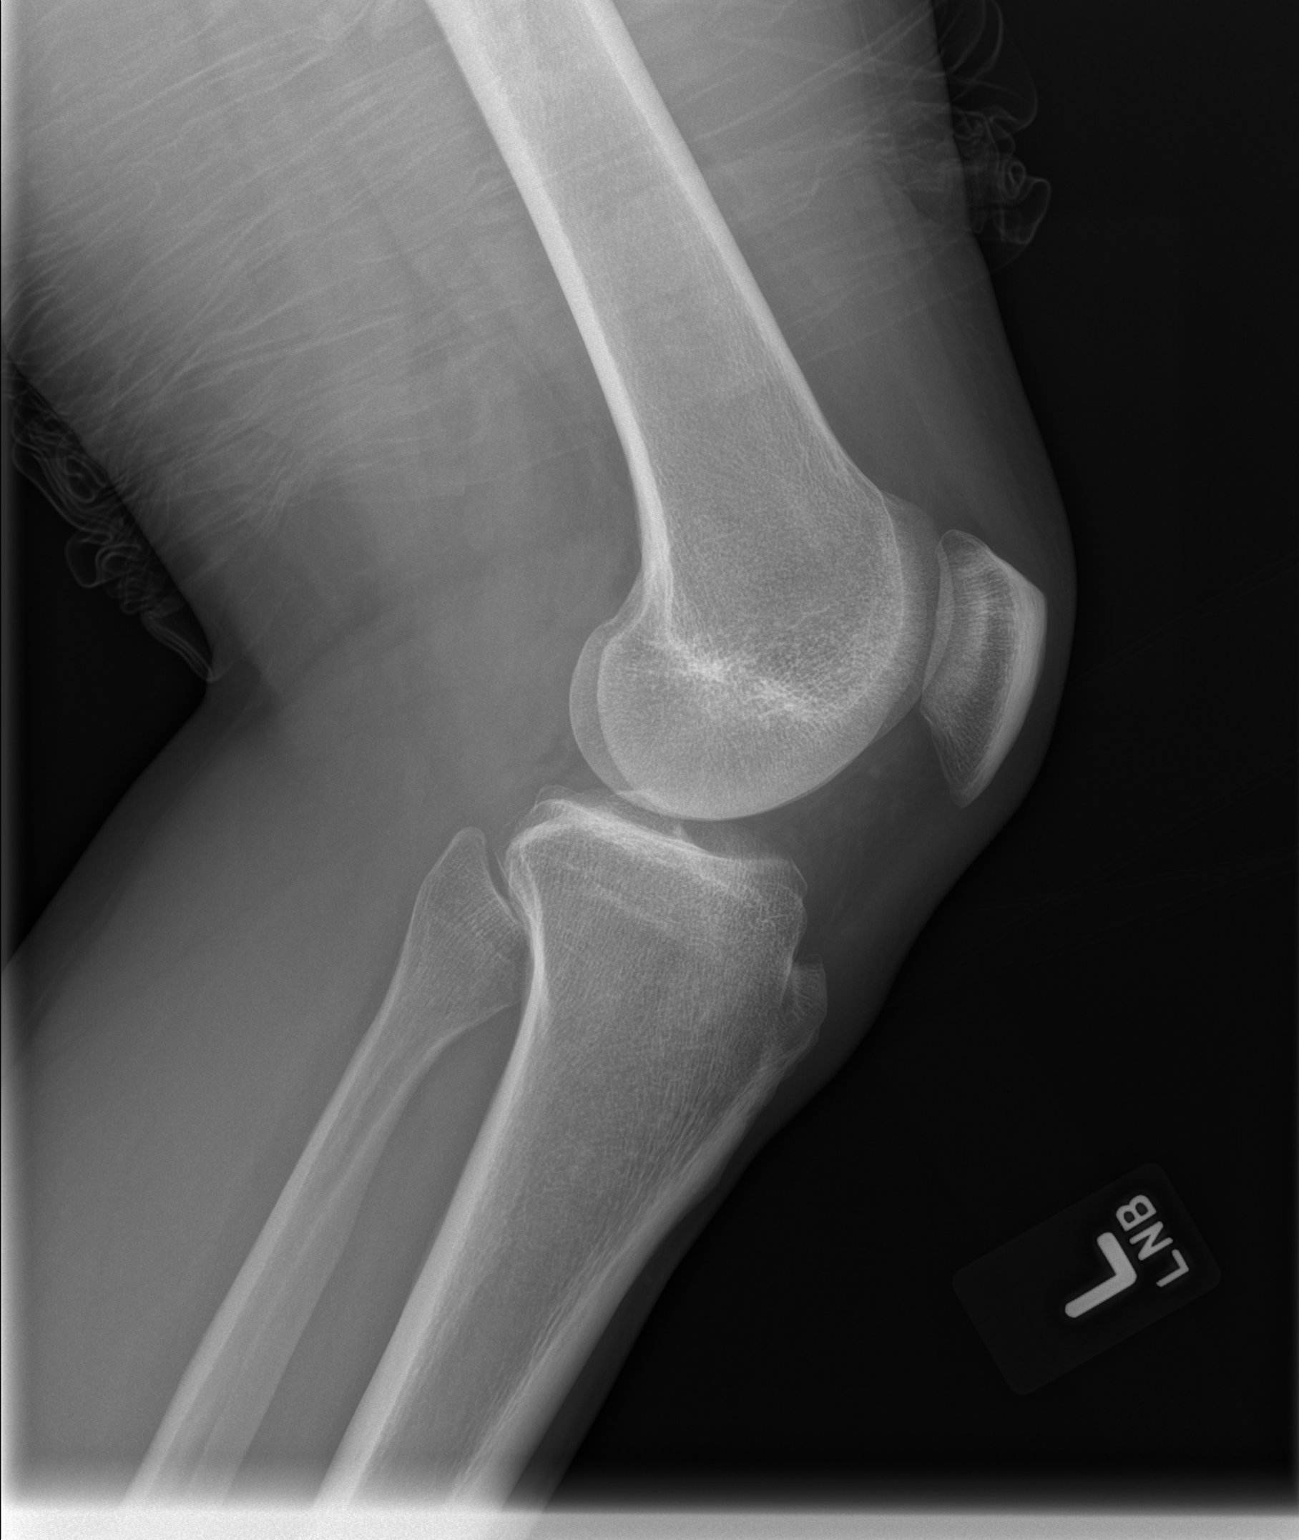

[2 of 2 positions shown; findings below may reference images not displayed]

FINDINGS: No acute fracture. Joint spaces are maintained. No significant effusion.
IMPRESSION: No acute findings.

[REDACTED]

## 2012-05-28 ENCOUNTER — Emergency Department: Payer: Self-pay | Admitting: Emergency Medicine

## 2012-05-28 LAB — URINALYSIS, COMPLETE
Bacteria: NONE SEEN
Bilirubin,UR: NEGATIVE
Glucose,UR: NEGATIVE mg/dL (ref 0–75)
Nitrite: NEGATIVE
Ph: 5 (ref 4.5–8.0)
Protein: 30
RBC,UR: 2 /HPF (ref 0–5)
Specific Gravity: 1.029 (ref 1.003–1.030)
Squamous Epithelial: NONE SEEN
WBC UR: 1 /HPF (ref 0–5)

## 2012-05-28 IMAGING — US US PELVIS LIMITED
1 series · 14 of 25 positions shown · non-contrast
Comparison: none

REASON FOR EXAM: painful testicle
COMMENTS:

PROCEDURE:     US  - US TESTICULAR  - [DATE] [DATE]
RESULT:     Comparison: [DATE]
TECHNIQUE: Multiple gray-scale, color Doppler, and spectral Doppler images
were obtained of the testicles.

[Series 1: us pelvis limited · 0.08mm/px · 14 of 55 slices shown]
[im 1/55]
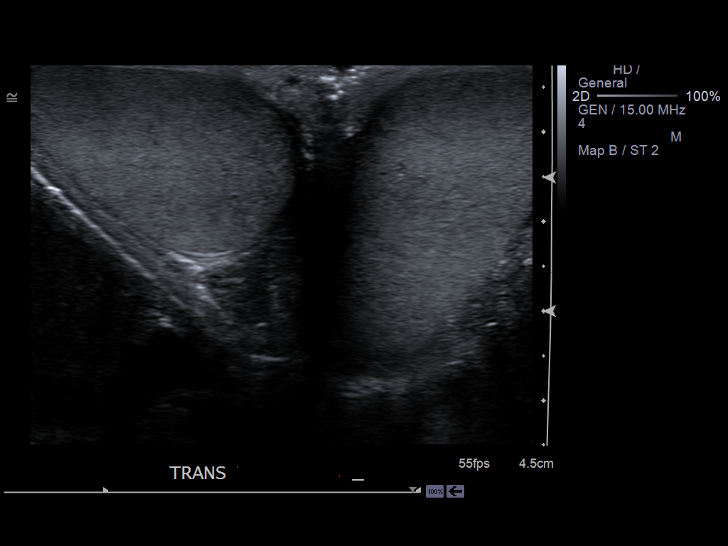
[im 5/55]
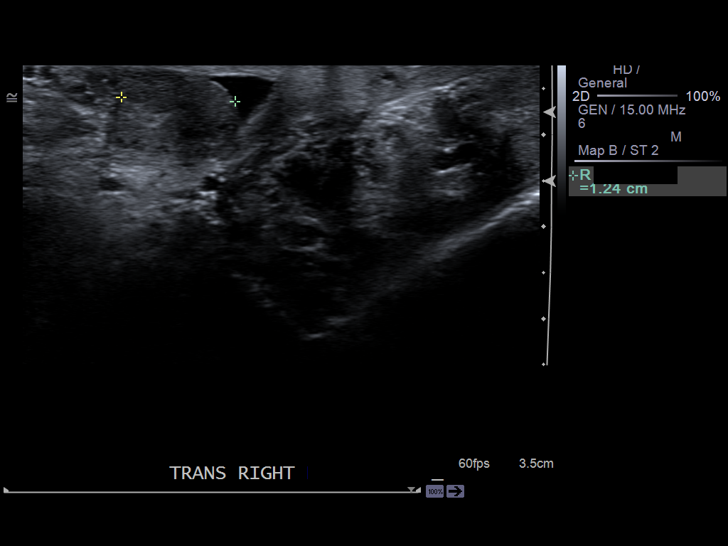
[im 10/55]
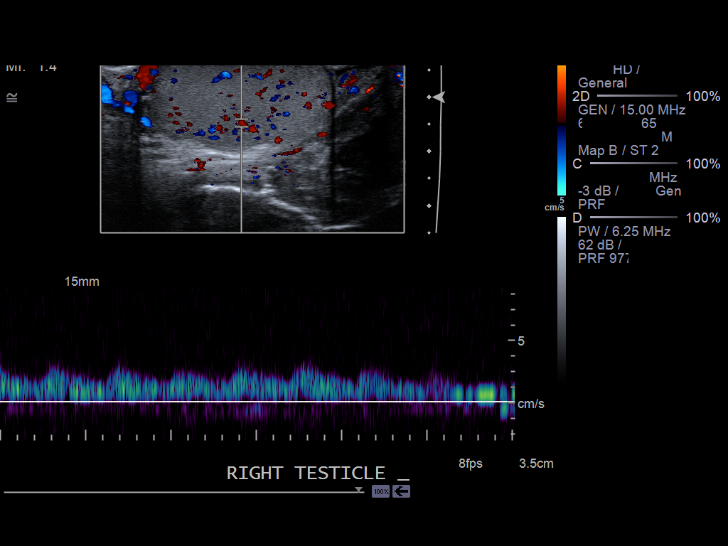
[im 14/55]
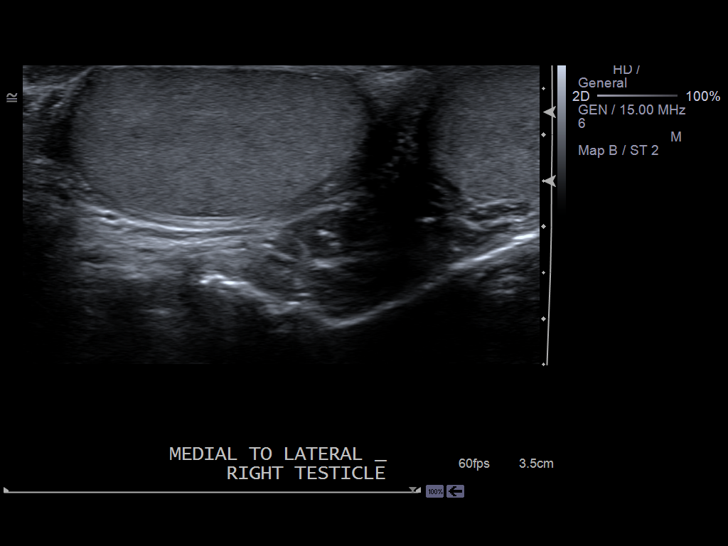
[im 19/55]
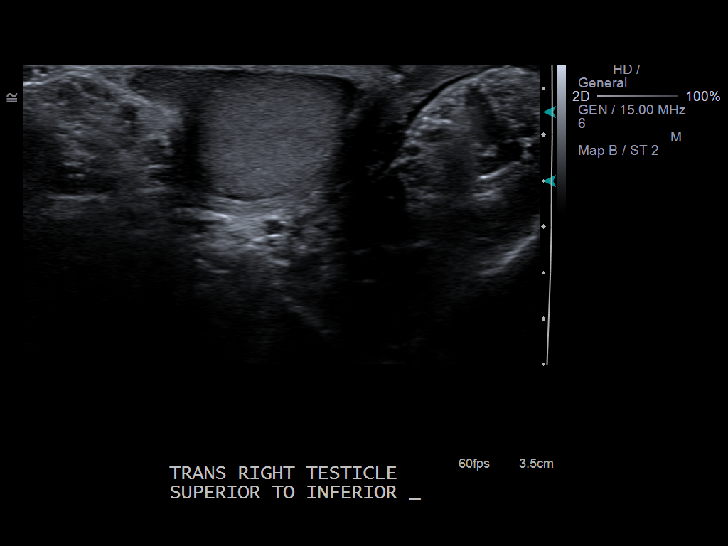
[im 21/55]
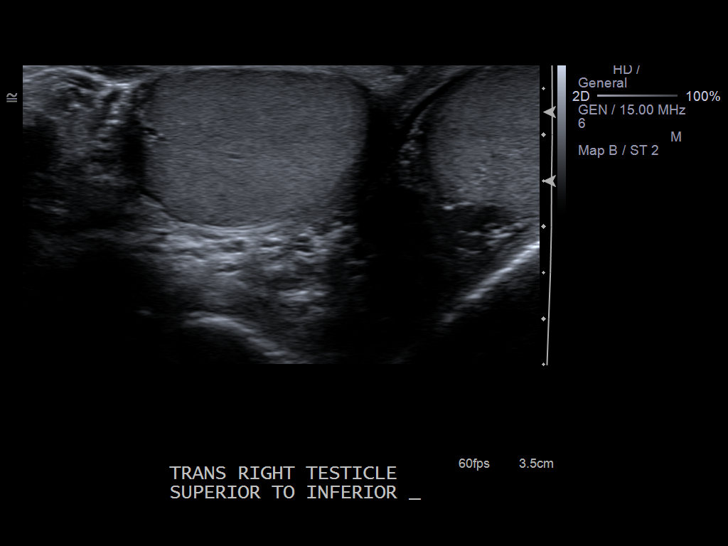
[im 25/55]
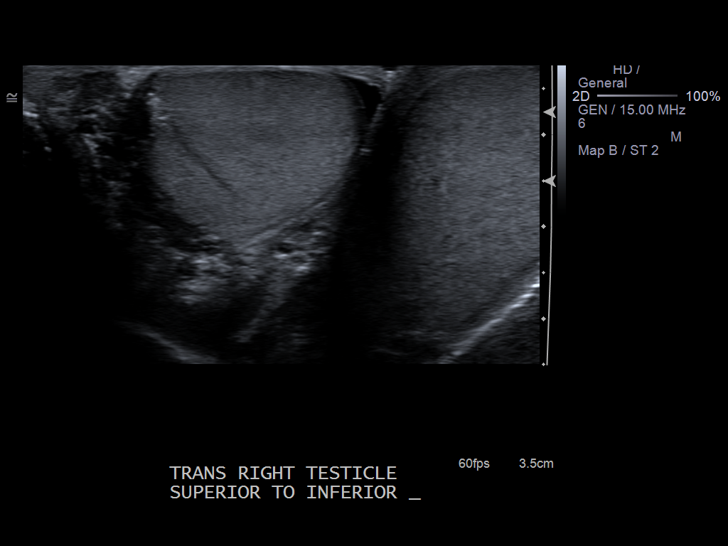
[im 30/55]
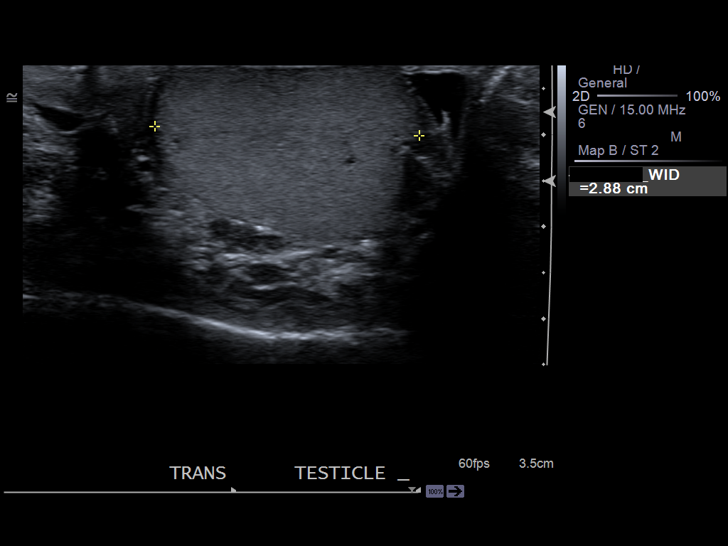
[im 34/55]
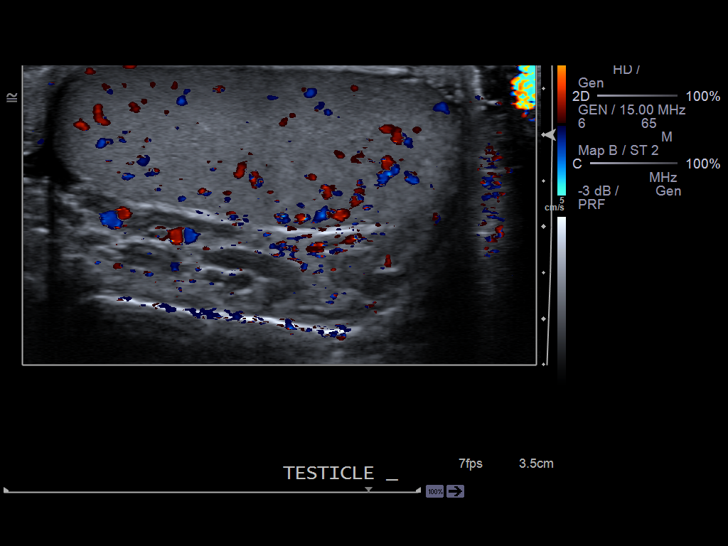
[im 37/55]
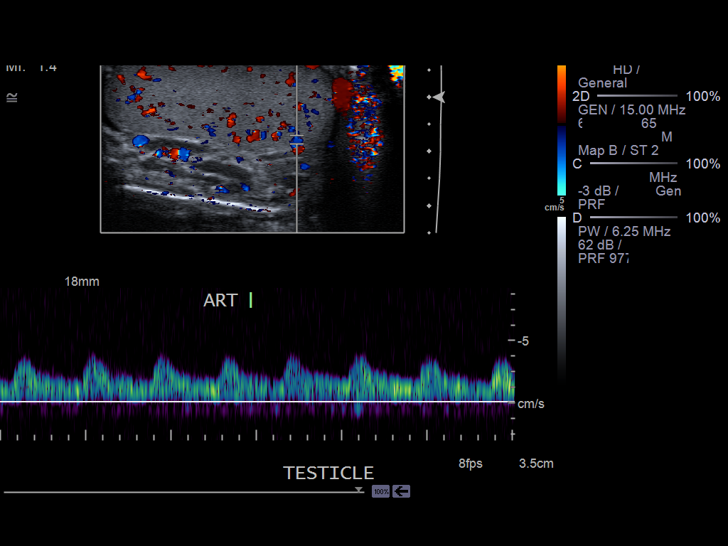
[im 41/55]
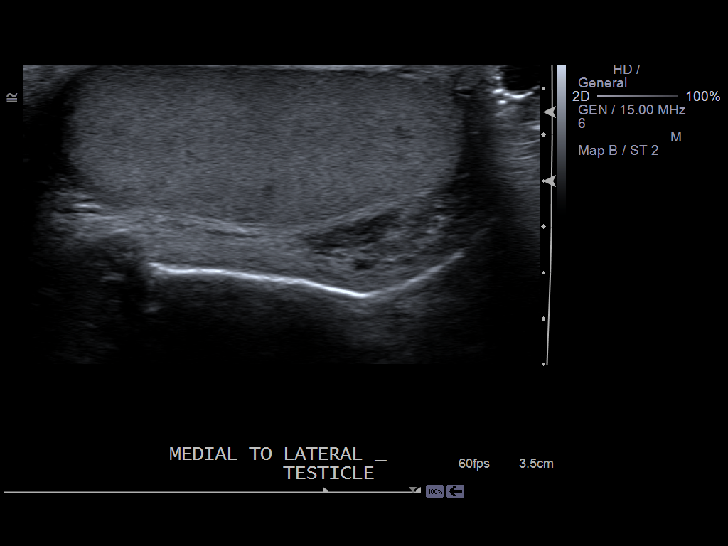
[im 46/55]
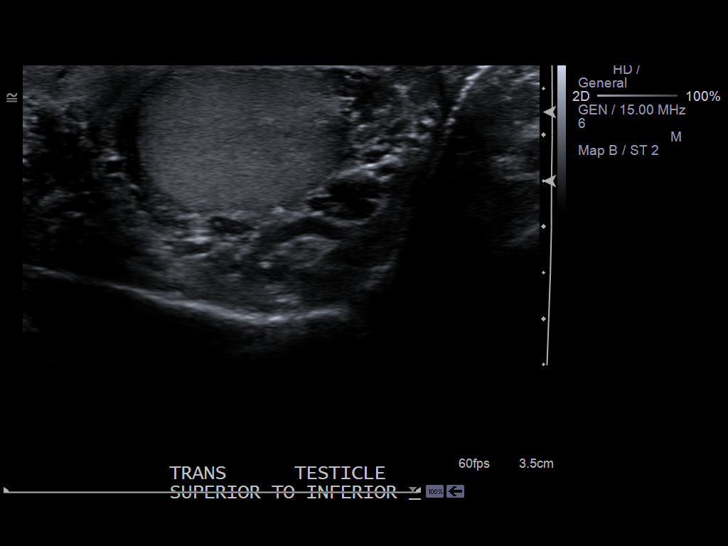
[im 50/55]
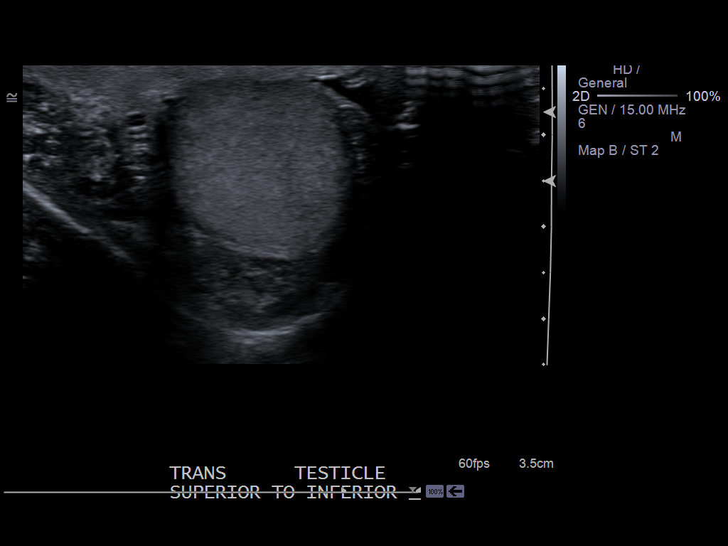
[im 55/55]
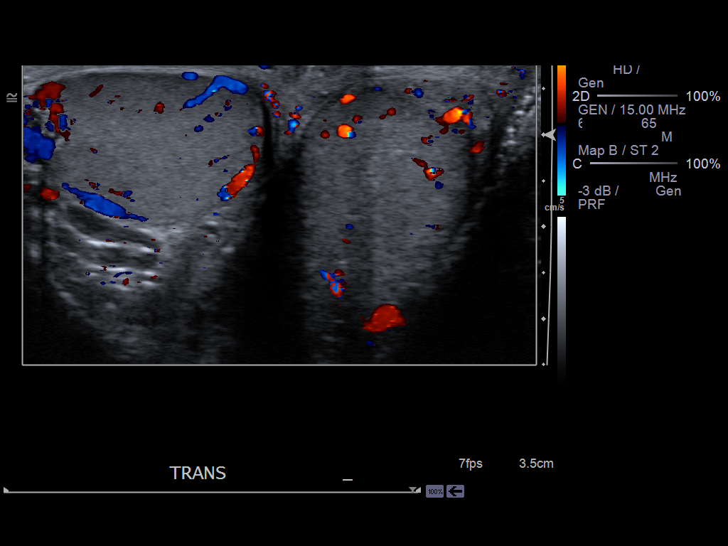

[14 of 25 positions shown; findings below may reference images not displayed]

FINDINGS: The right testicle measures 4.0 x 2.7 x 1.7 cm. The left testicle measures
3.9 x 2.9 x 1.7 cm. The testicles are homogeneous in echotexture. Arterial
and venous Spector Doppler waveforms are demonstrated in bilateral
testicles. The epididymis is unremarkable bilaterally. There are several
prominent vessels adjacent to the left testicle which demonstrate increased
flow with Valsalva, consistent with a varicocele. There is a possible small
varicocele on the right, as well.
IMPRESSION: 1. No evidence of testicular torsion.
2. Left-sided varicocele. Possible right-sided varicocele.

[REDACTED]

## 2012-06-12 ENCOUNTER — Ambulatory Visit: Payer: Self-pay | Admitting: Surgery

## 2012-09-03 ENCOUNTER — Emergency Department: Payer: Self-pay | Admitting: Emergency Medicine

## 2013-02-01 ENCOUNTER — Emergency Department: Payer: Self-pay

## 2013-02-01 IMAGING — CT CT HEAD WITHOUT CONTRAST
1 series · 16 of 30 positions shown, 20 images · non-contrast
Comparison: None.

CLINICAL DATA: Headache, photophobia, vomiting

EXAM:
CT HEAD WITHOUT CONTRAST
TECHNIQUE: Contiguous axial images were obtained from the base of the skull
through the vertex without intravenous contrast.

[Series 2: head wo · axial · 0.39mm/px · z∈[+146,+272]mm · 16 of 32 slices shown, 20 images]
[im 2/32  brain]
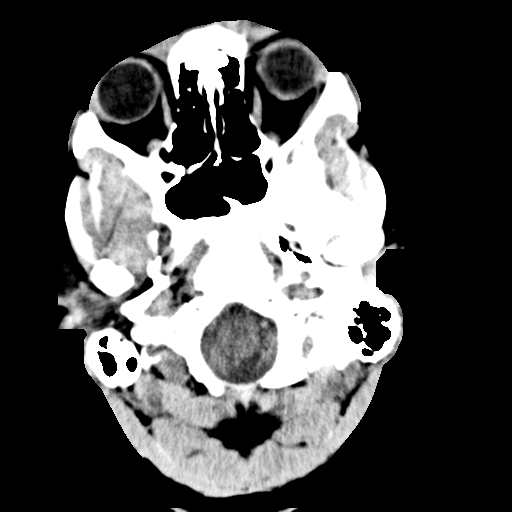
[im 2/32  bone]
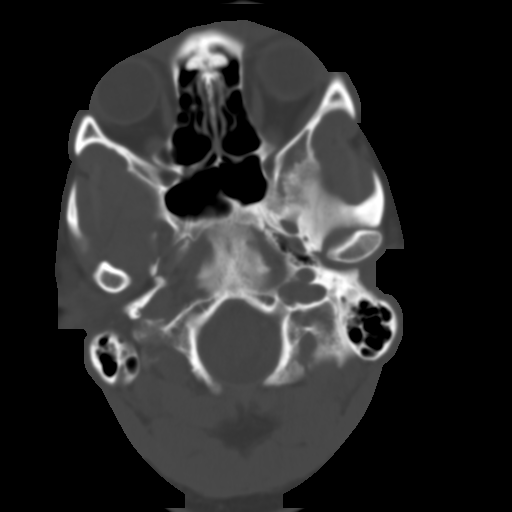
[im 4/32  brain]
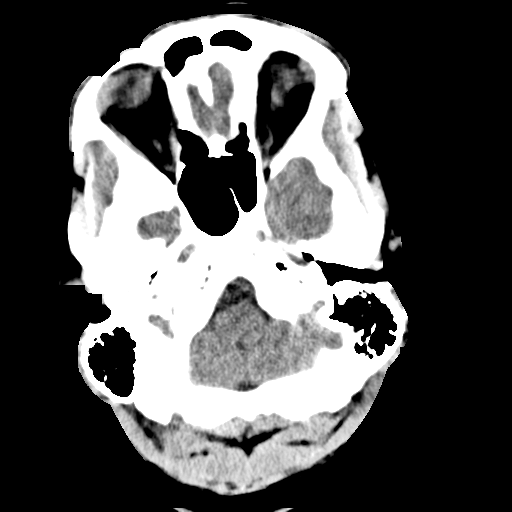
[im 6/32  brain]
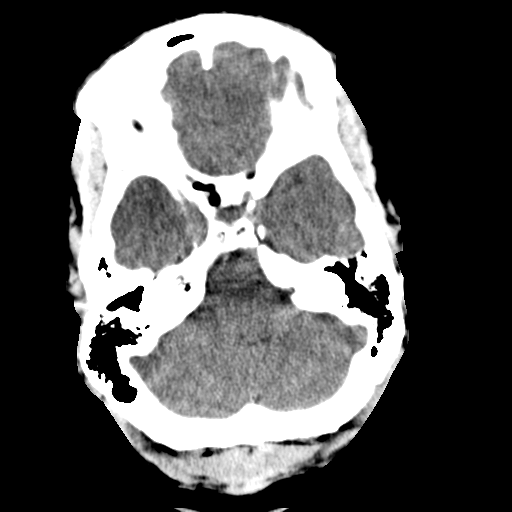
[im 8/32  brain]
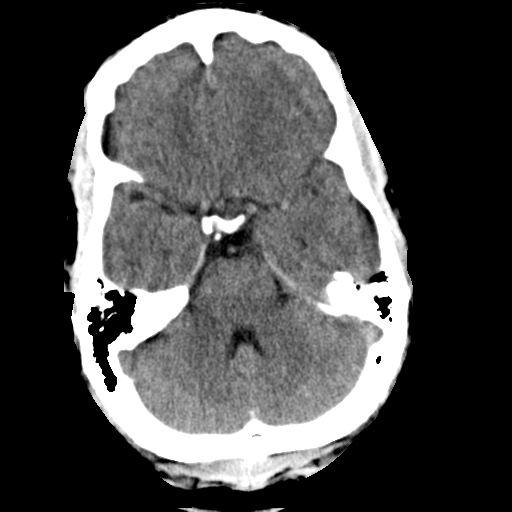
[im 9/32  brain]
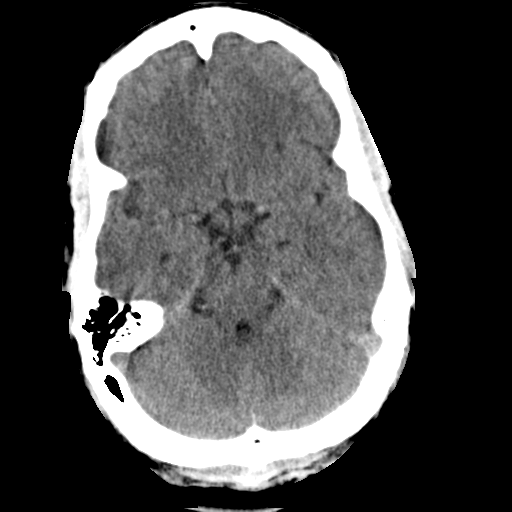
[im 9/32  bone]
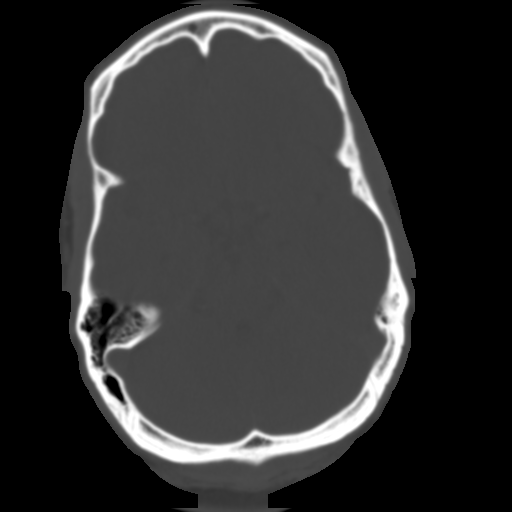
[im 11/32  brain]
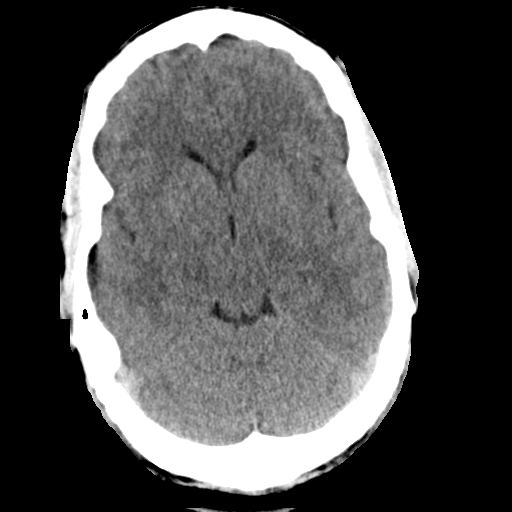
[im 13/32  brain]
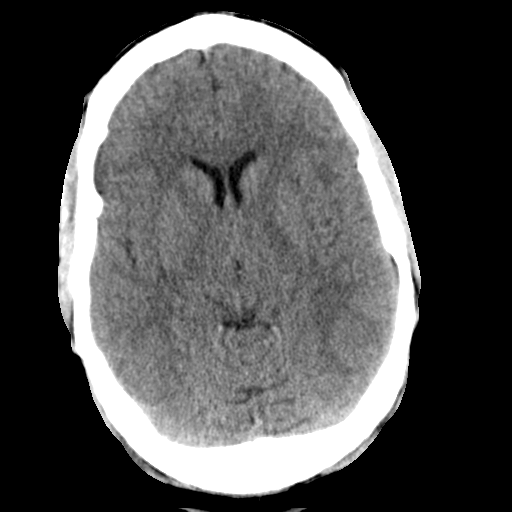
[im 15/32  brain]
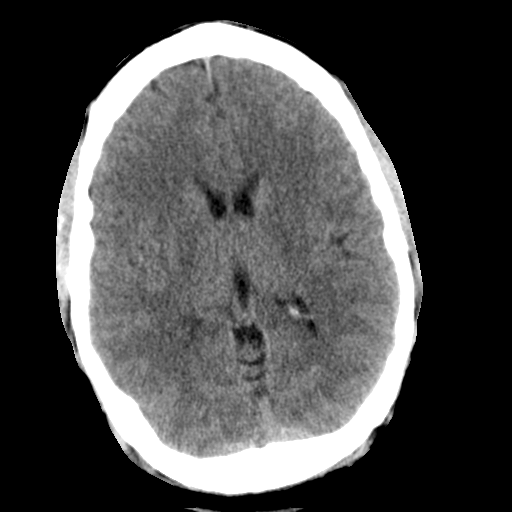
[im 17/32  brain]
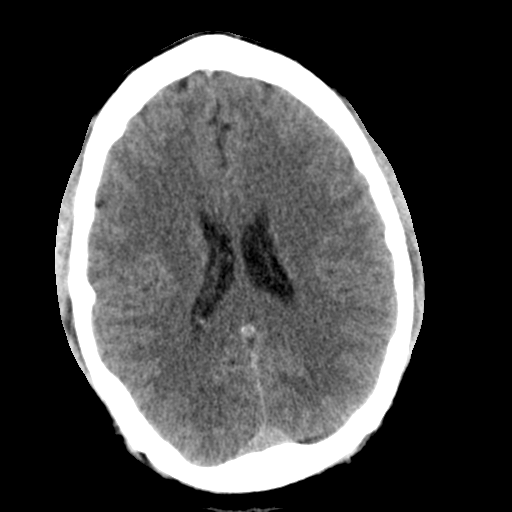
[im 17/32  bone]
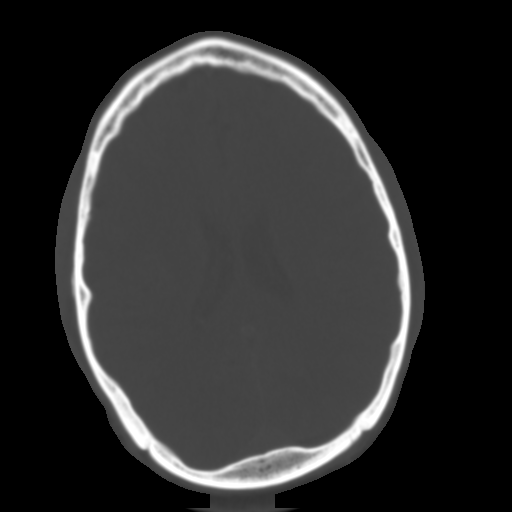
[im 19/32  brain]
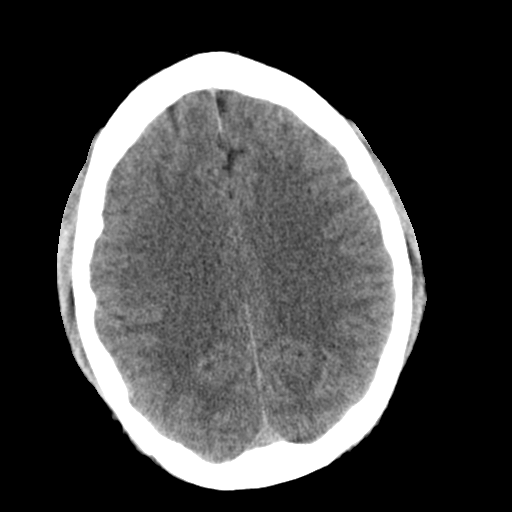
[im 21/32  brain]
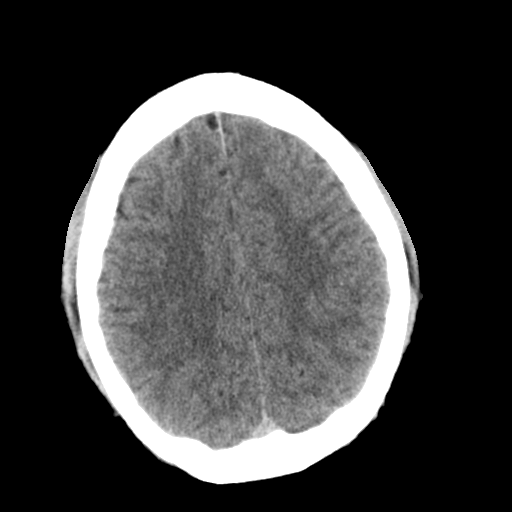
[im 23/32  brain]
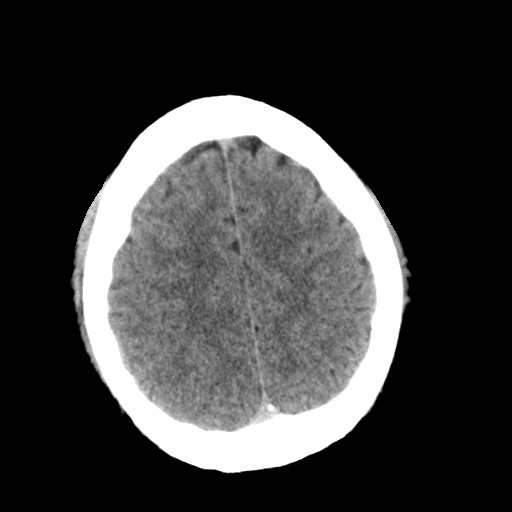
[im 24/32  brain]
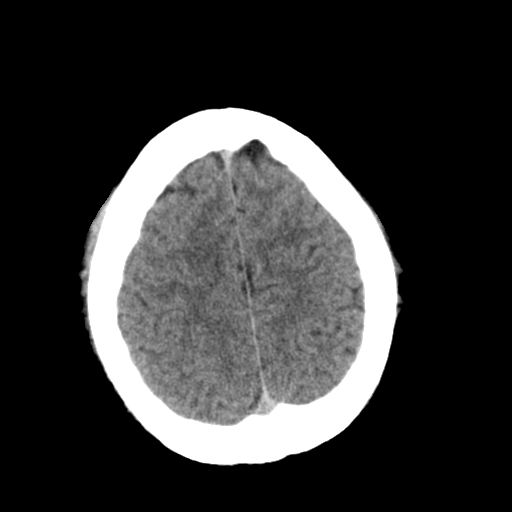
[im 24/32  bone]
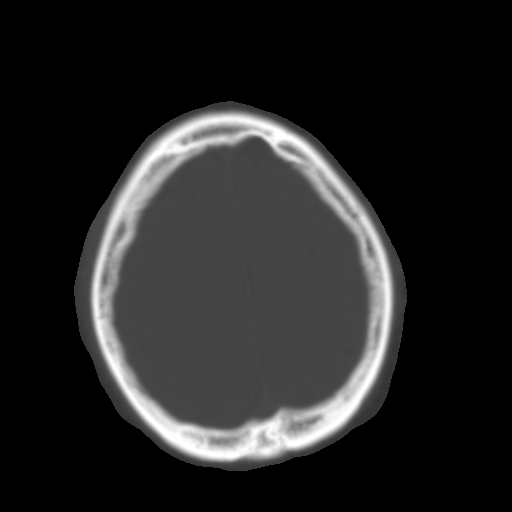
[im 26/32  brain]
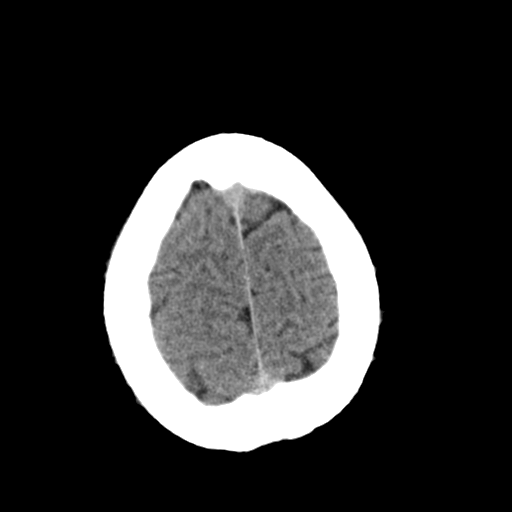
[im 28/32  brain]
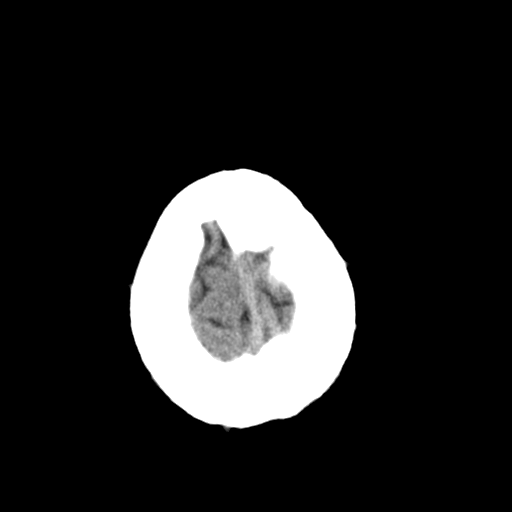
[im 30/32  brain]
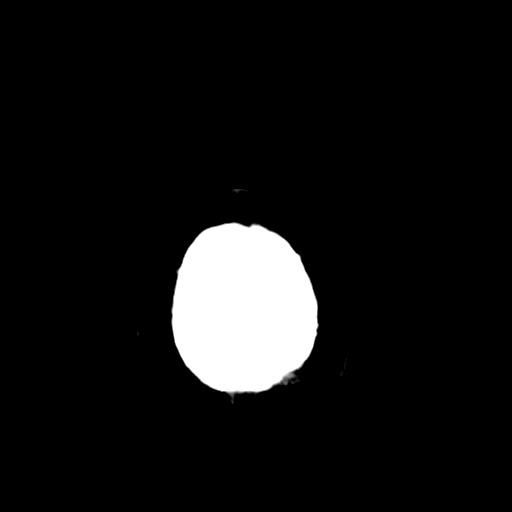

[16 of 30 positions shown; findings below may reference images not displayed]

FINDINGS: There is no evidence of acute intracranial hemorrhage, brain edema,
mass lesion, acute infarction, mass effect, or midline shift. Acute
infarct may be inapparent on noncontrast CT. No other intra-axial
abnormalities are seen, and the ventricles and sulci are within
normal limits in size and symmetry. No abnormal extra-axial fluid
collections or masses are identified. No significant calvarial
abnormality.
IMPRESSION: Negative for bleed or other acute intracranial process.

## 2013-06-26 ENCOUNTER — Emergency Department: Payer: Self-pay | Admitting: Emergency Medicine

## 2013-06-26 LAB — BASIC METABOLIC PANEL
Anion Gap: 7 (ref 7–16)
BUN: 10 mg/dL (ref 7–18)
CALCIUM: 8.7 mg/dL (ref 8.5–10.1)
Chloride: 109 mmol/L — ABNORMAL HIGH (ref 98–107)
Co2: 28 mmol/L (ref 21–32)
Creatinine: 1.31 mg/dL — ABNORMAL HIGH (ref 0.60–1.30)
GLUCOSE: 100 mg/dL — AB (ref 65–99)
Osmolality: 286 (ref 275–301)
Potassium: 3.7 mmol/L (ref 3.5–5.1)
Sodium: 144 mmol/L (ref 136–145)

## 2013-06-26 LAB — CBC
HCT: 40.4 % (ref 40.0–52.0)
HGB: 13.5 g/dL (ref 13.0–18.0)
MCH: 31.4 pg (ref 26.0–34.0)
MCHC: 33.4 g/dL (ref 32.0–36.0)
MCV: 94 fL (ref 80–100)
PLATELETS: 166 10*3/uL (ref 150–440)
RBC: 4.29 10*6/uL — ABNORMAL LOW (ref 4.40–5.90)
RDW: 14.4 % (ref 11.5–14.5)
WBC: 10.6 10*3/uL (ref 3.8–10.6)

## 2013-06-26 LAB — TROPONIN I: Troponin-I: <0.02 ng/mL

## 2013-06-26 IMAGING — CR DG CHEST 2V
1 series · 2 of 2 positions shown · non-contrast
Comparison: [DATE]

CLINICAL DATA: Chest pain and numbness in the upper extremities.

EXAM:
CHEST  2 VIEW

[Series 1: w chest pa · 0.14mm/px · 2 of 2 slices shown]
[im 1/2]
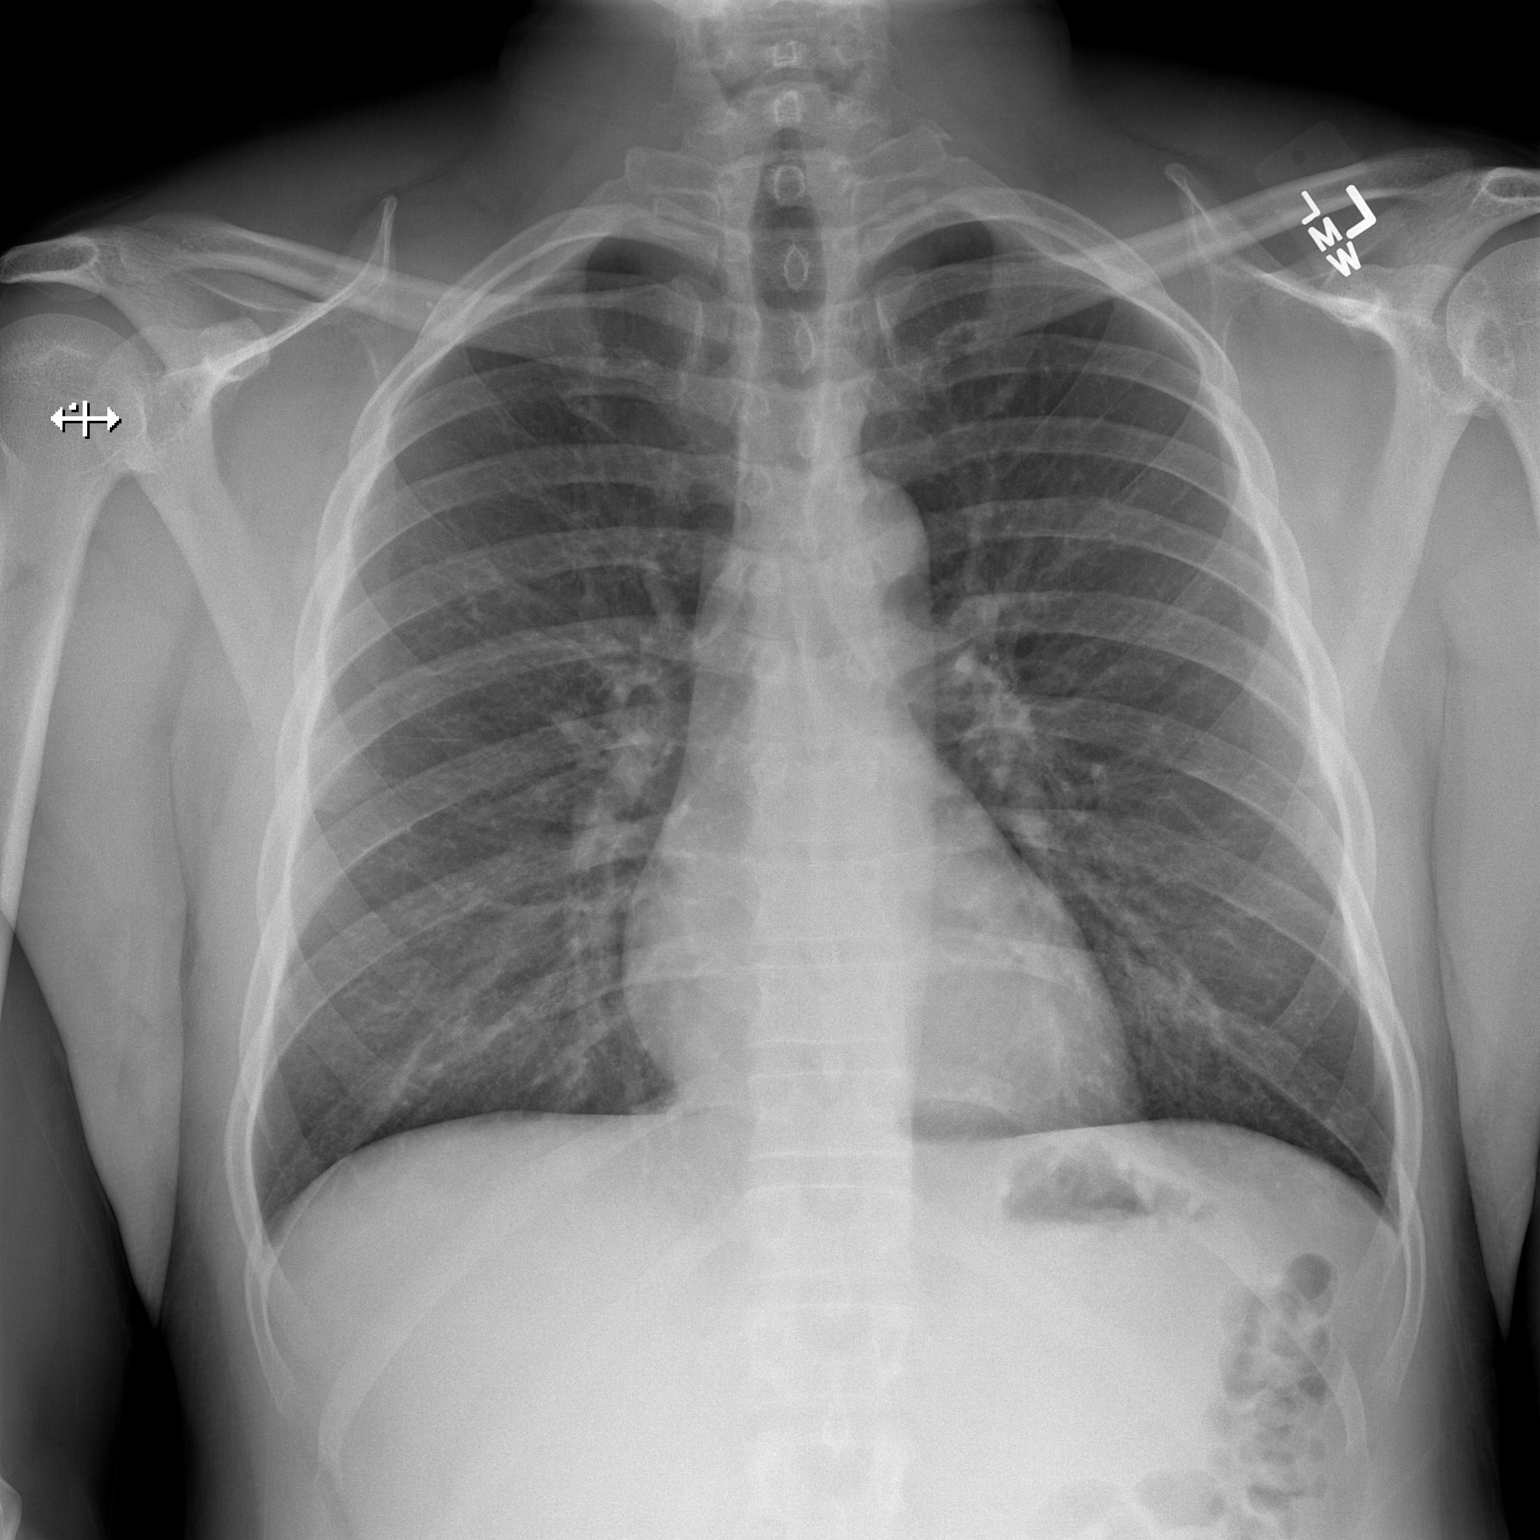
[im 2/2]
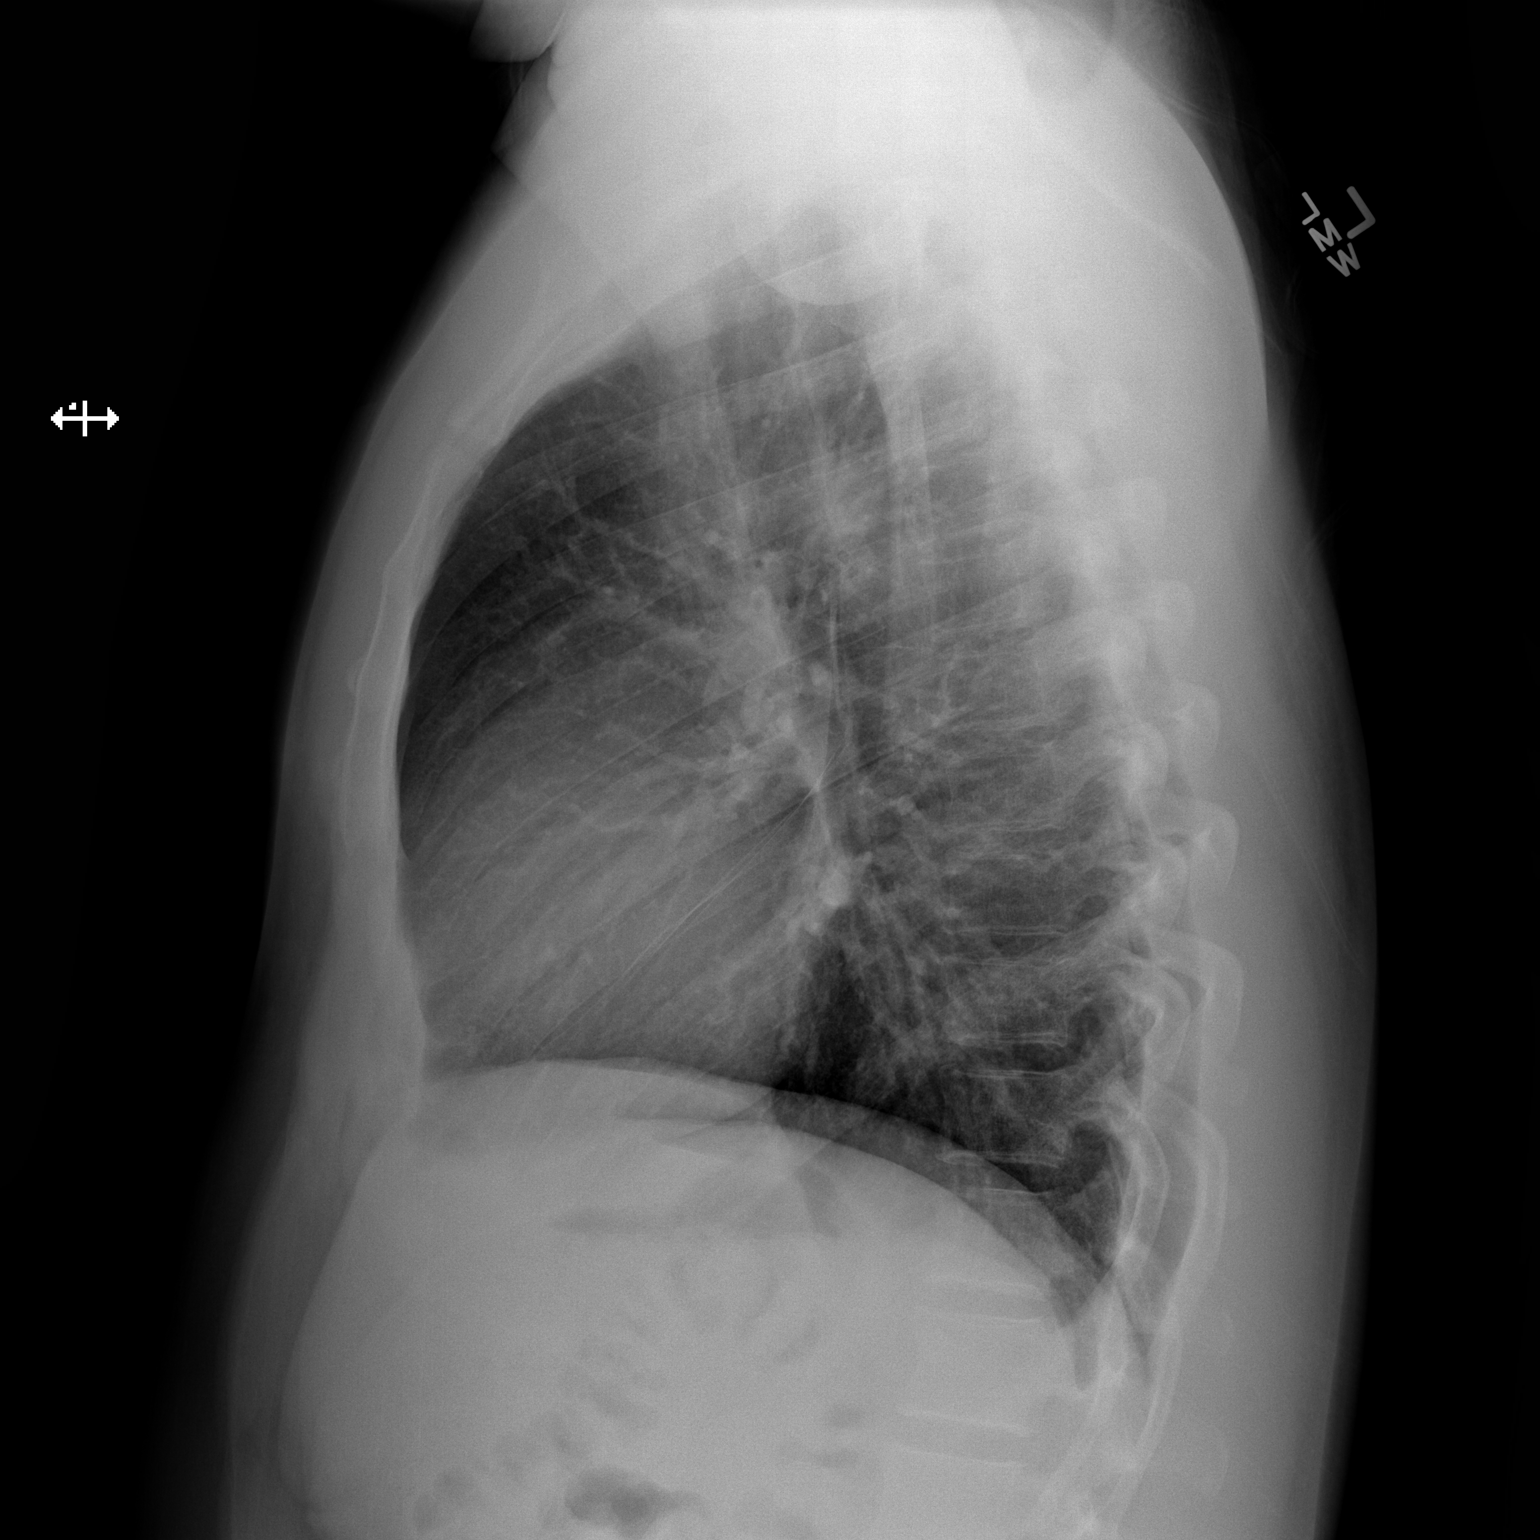

[2 of 2 positions shown; findings below may reference images not displayed]

FINDINGS: The heart size and mediastinal contours are within normal limits.
Both lungs are clear. The visualized skeletal structures are
unremarkable.
IMPRESSION: No active cardiopulmonary disease.

## 2013-10-28 ENCOUNTER — Emergency Department: Payer: Self-pay | Admitting: Emergency Medicine

## 2013-10-28 IMAGING — CR DG CHEST 2V
1 series · 2 of 2 positions shown · non-contrast
Comparison: [DATE]

CLINICAL DATA: Cough for 1 week.  Fevers

EXAM:
CHEST  2 VIEW

[Series 1: dxr chest pa (or ap) and lateral · 0.14mm/px · 2 of 2 slices shown]
[im 1/2]
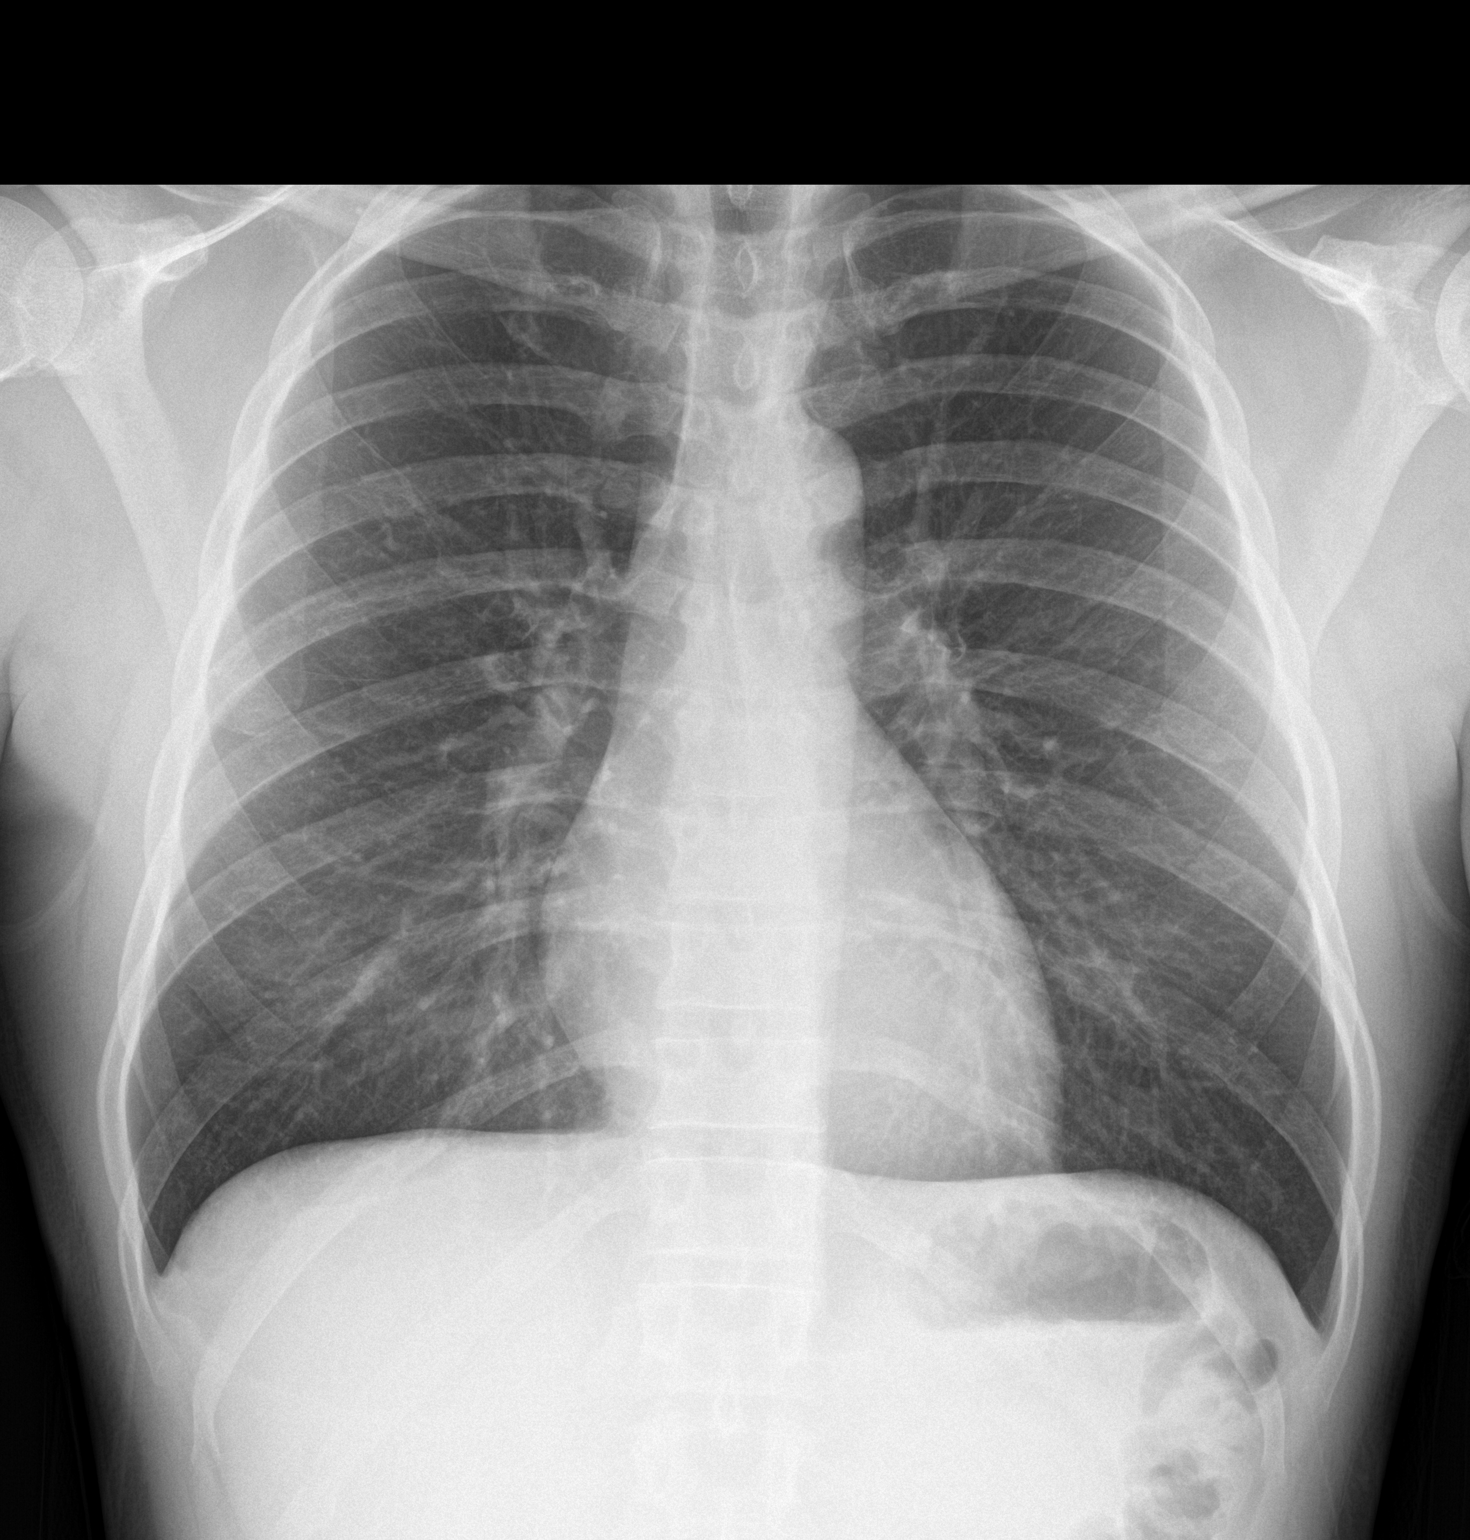
[im 2/2]
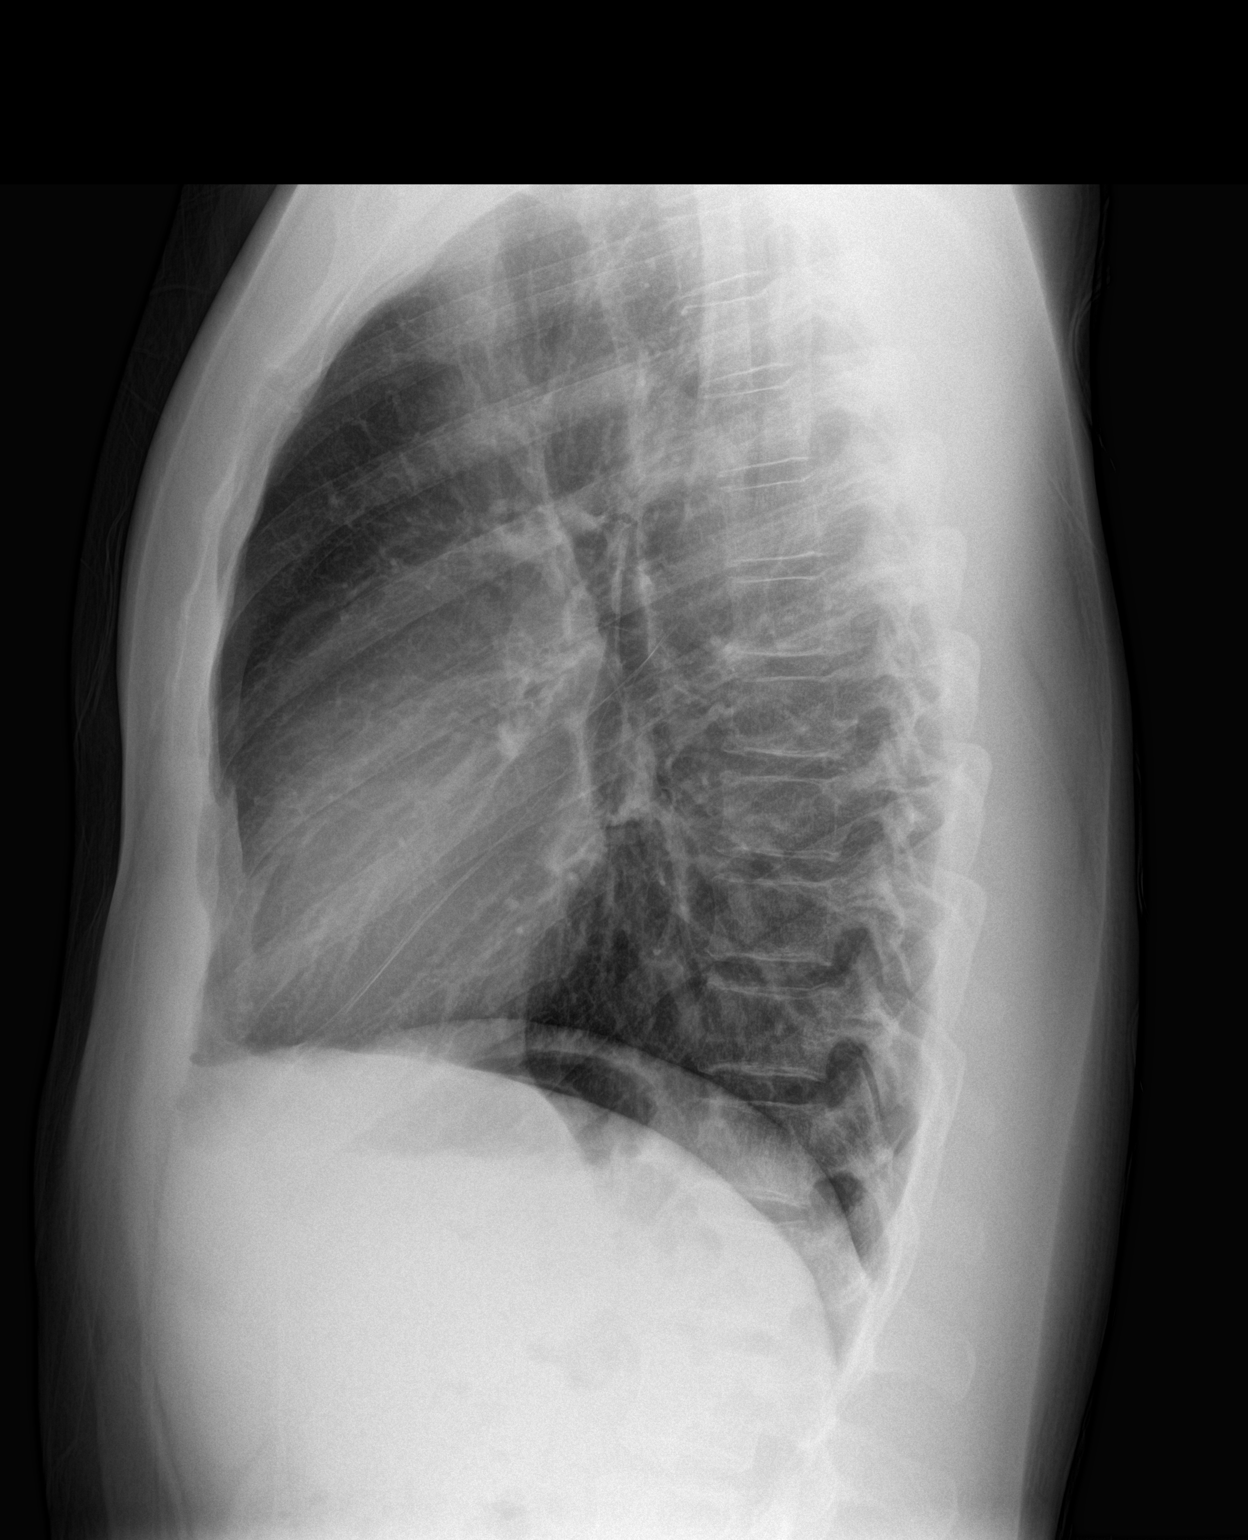

[2 of 2 positions shown; findings below may reference images not displayed]

FINDINGS: The heart size and mediastinal contours are within normal limits.
Both lungs are clear. The visualized skeletal structures are
unremarkable.
IMPRESSION: No active cardiopulmonary disease.

## 2014-02-01 ENCOUNTER — Emergency Department: Payer: Self-pay | Admitting: Emergency Medicine

## 2014-02-01 IMAGING — US US SCROTUM W/ DOPPLER COMPLETE
1 series · 14 of 25 positions shown · non-contrast
Comparison: None.

CLINICAL DATA: Left-sided testicular pain and previous repair of
left inguinal hernia.

EXAM:
SCROTAL ULTRASOUND
DOPPLER ULTRASOUND OF THE TESTICLES
TECHNIQUE: Complete ultrasound examination of the testicles, epididymis, and
other scrotal structures was performed. Color and spectral Doppler
ultrasound were also utilized to evaluate blood flow to the
testicles.

[Series 1: us scrotum w/ doppler complete · 0.05mm/px · 14 of 65 slices shown]
[im 1/65]
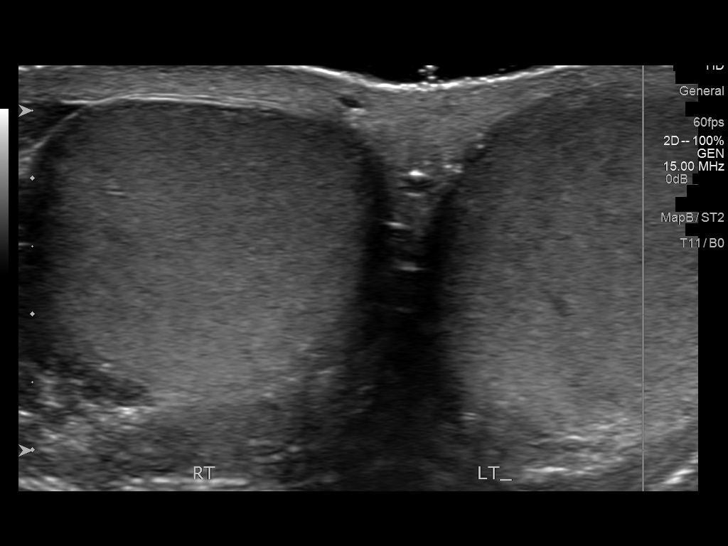
[im 6/65]
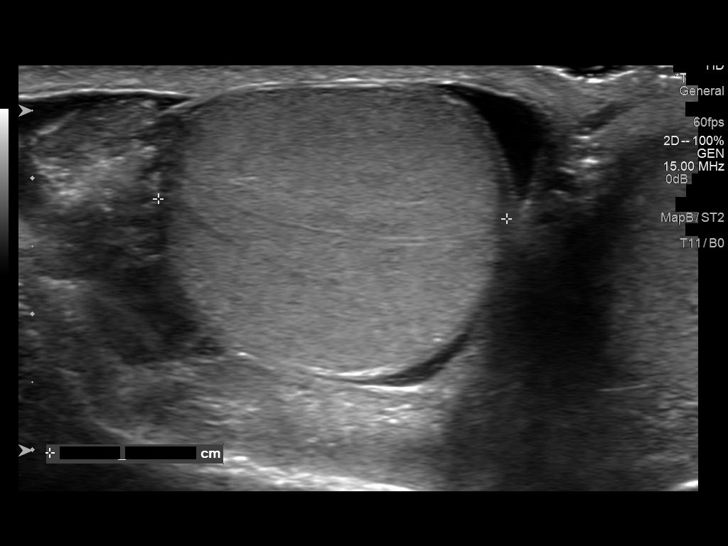
[im 11/65]
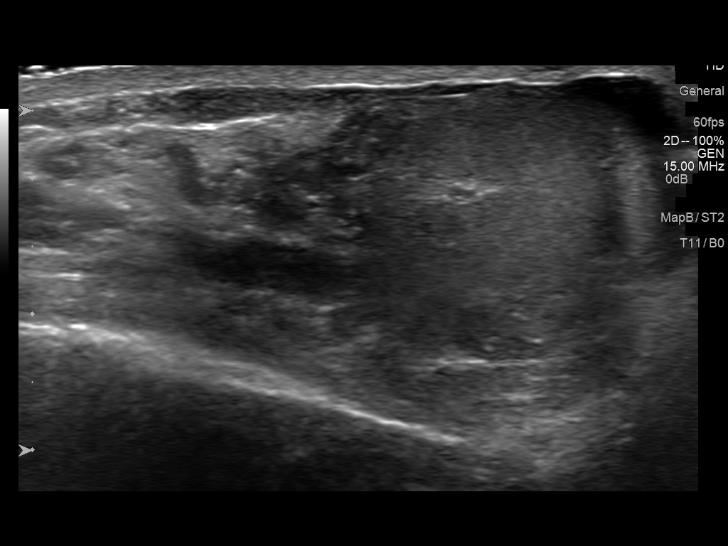
[im 17/65]
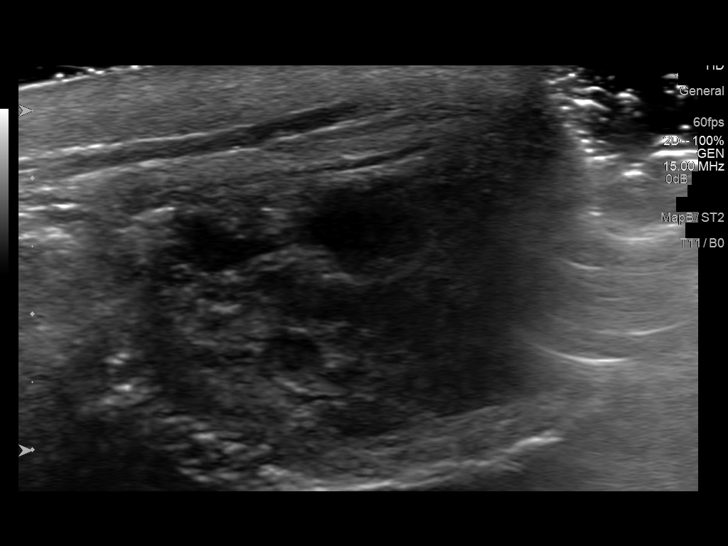
[im 22/65]
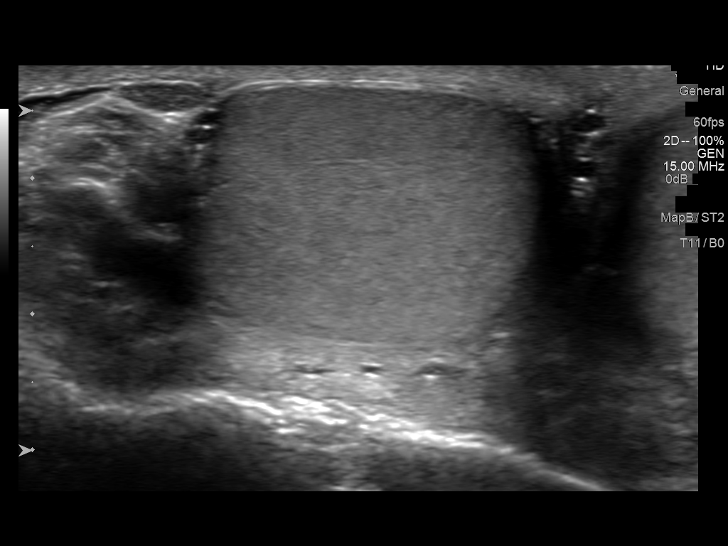
[im 25/65]
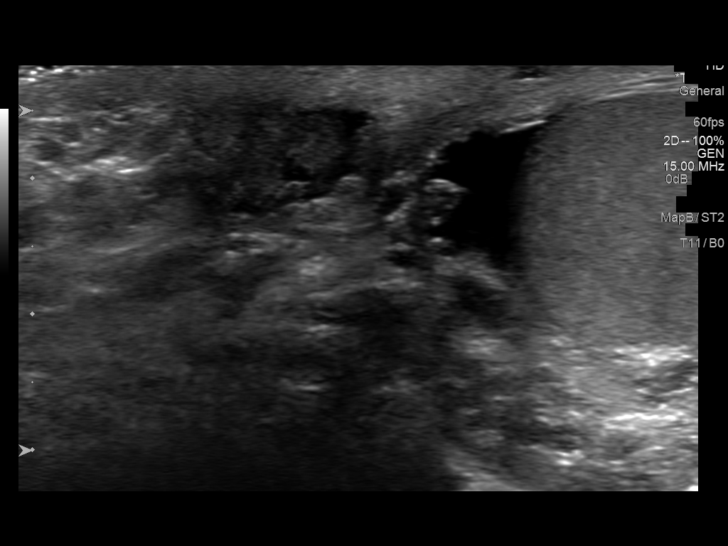
[im 30/65]
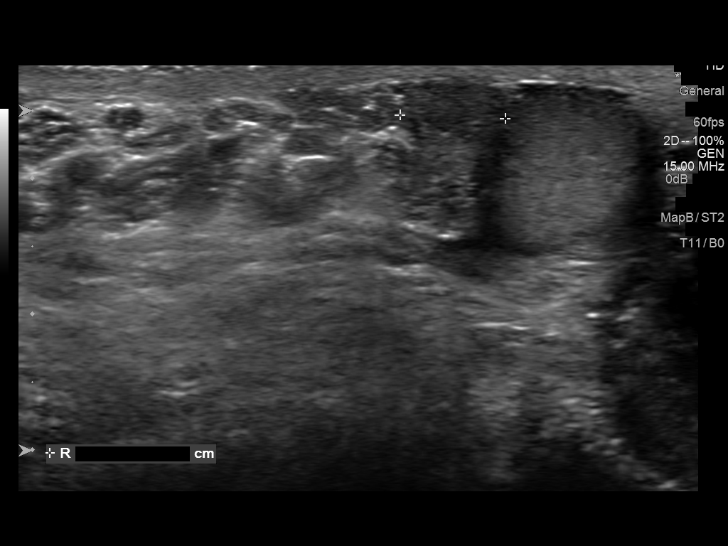
[im 35/65]
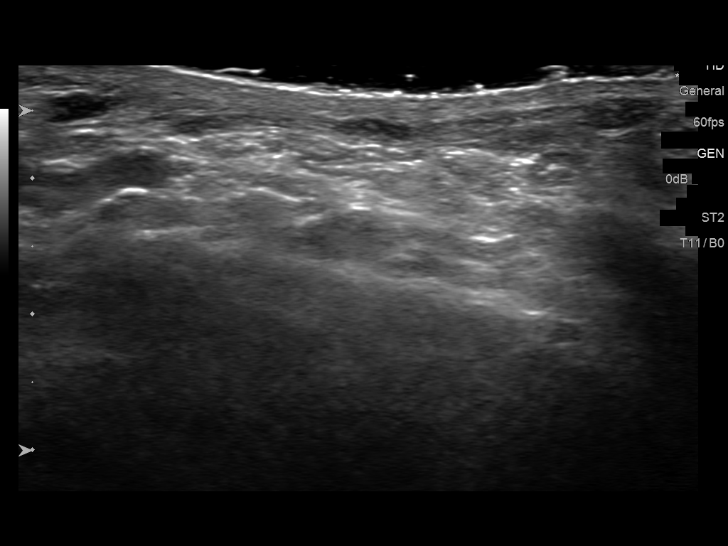
[im 41/65]
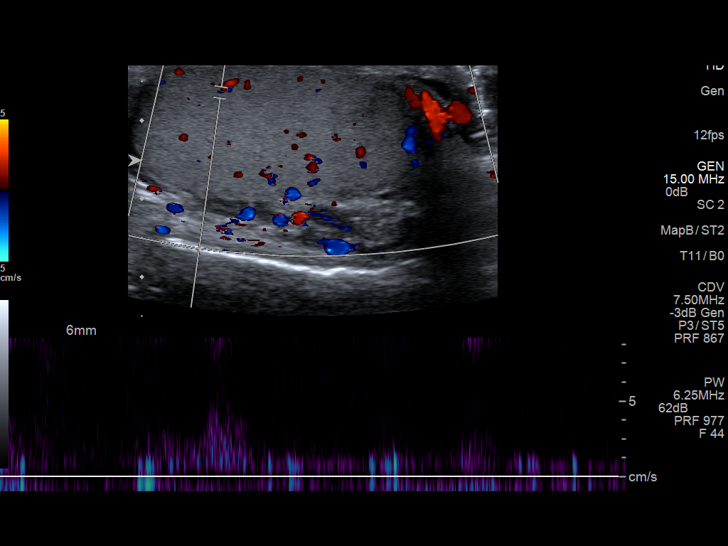
[im 43/65]
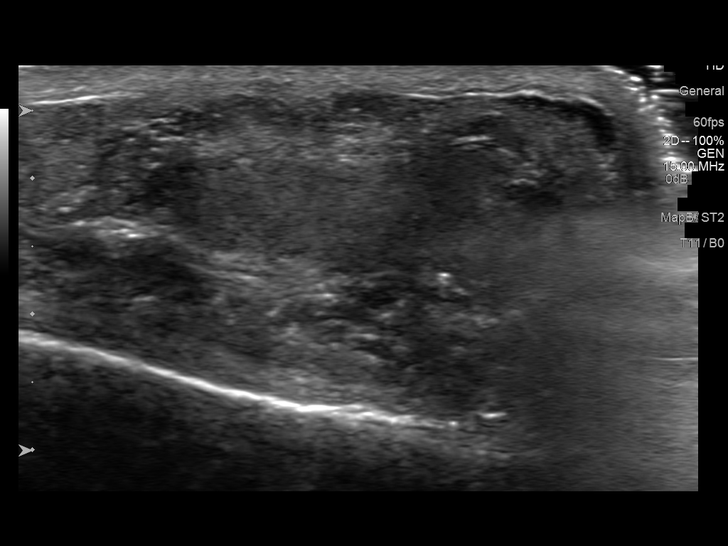
[im 49/65]
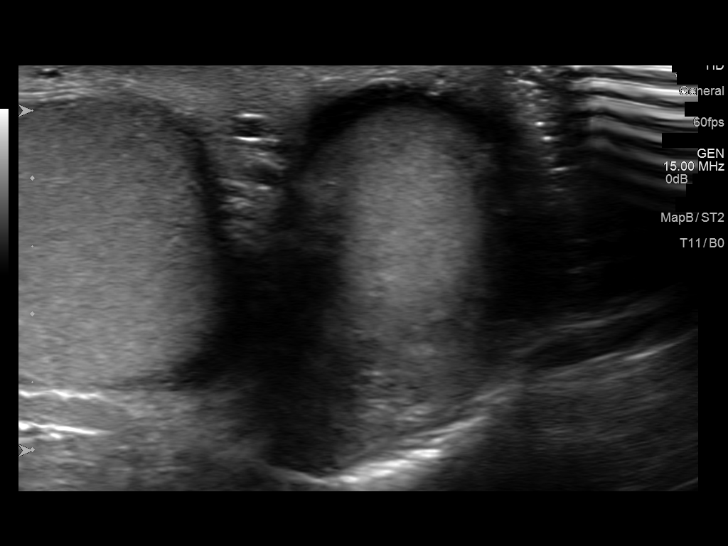
[im 54/65]
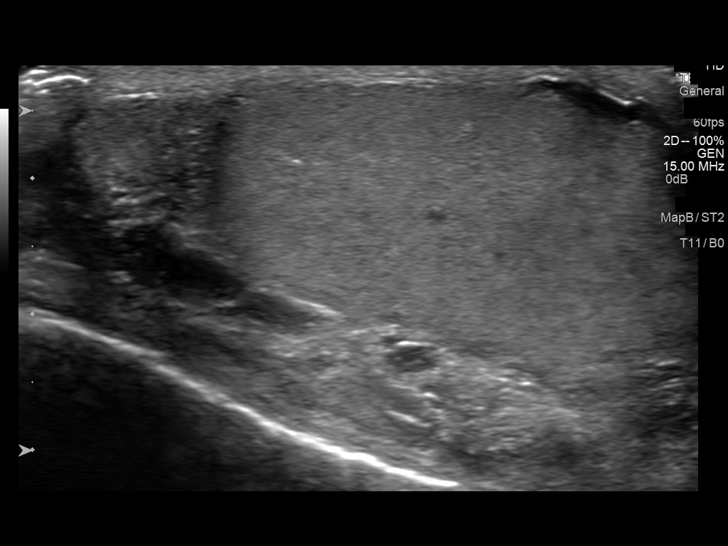
[im 59/65]
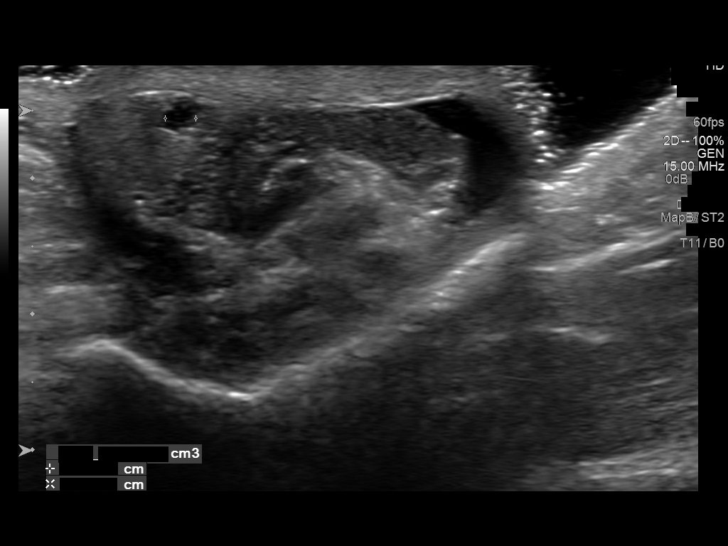
[im 65/65]
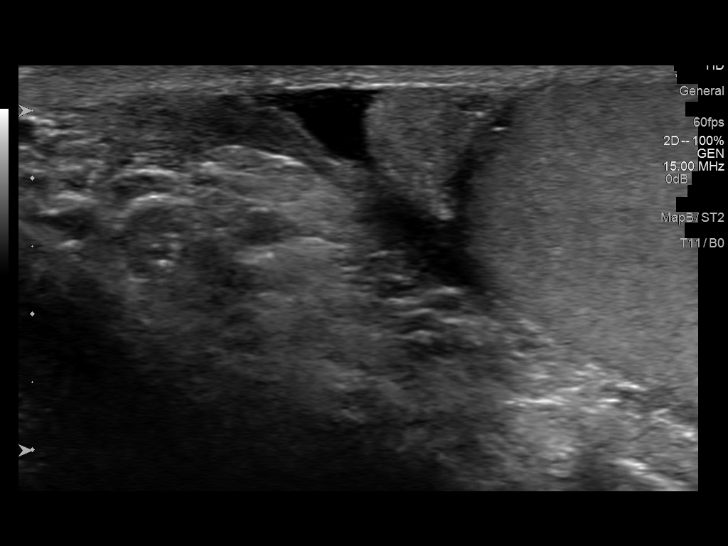

[14 of 25 positions shown; findings below may reference images not displayed]

FINDINGS: Right testicle

Measurements: 3.8 x 2.6 x 2.0 cm. No mass or microlithiasis
visualized.

Left testicle

Measurements: 4.0 x 2.8 x 1.9 cm. No mass or microlithiasis
visualized.

Right epididymis:  Normal in size and appearance.

Left epididymis: Normal in size. Tiny cyst in the head of the
epididymis measures 0.3 cm and has a benign appearance.

Hydrocele:  None visualized.

Varicocele:  None visualized.

Pulsed Doppler interrogation of both testes demonstrates low
resistance arterial and venous waveforms bilaterally.
IMPRESSION: Normal scrotal ultrasound demonstrating no evidence of testicular
mass or torsion. Tiny left epididymal cyst appears benign.

## 2014-03-01 ENCOUNTER — Emergency Department: Payer: Self-pay | Admitting: Emergency Medicine

## 2014-03-26 ENCOUNTER — Emergency Department: Payer: Self-pay | Admitting: Emergency Medicine

## 2014-03-26 LAB — CBC
HCT: 44.8 % (ref 40.0–52.0)
HGB: 14.6 g/dL (ref 13.0–18.0)
MCH: 31.3 pg (ref 26.0–34.0)
MCHC: 32.5 g/dL (ref 32.0–36.0)
MCV: 96 fL (ref 80–100)
PLATELETS: 151 10*3/uL (ref 150–440)
RBC: 4.66 10*6/uL (ref 4.40–5.90)
RDW: 14.2 % (ref 11.5–14.5)
WBC: 9.9 10*3/uL (ref 3.8–10.6)

## 2014-03-26 LAB — URINALYSIS, COMPLETE
Bacteria: NONE SEEN
Bilirubin,UR: NEGATIVE
Glucose,UR: NEGATIVE mg/dL (ref 0–75)
Ketone: NEGATIVE
LEUKOCYTE ESTERASE: NEGATIVE
NITRITE: NEGATIVE
PH: 6 (ref 4.5–8.0)
PROTEIN: NEGATIVE
RBC,UR: 406 /HPF (ref 0–5)
Specific Gravity: 1.012 (ref 1.003–1.030)
Squamous Epithelial: NONE SEEN
WBC UR: 1 /HPF (ref 0–5)

## 2014-03-26 LAB — BASIC METABOLIC PANEL
ANION GAP: 7 (ref 7–16)
BUN: 10 mg/dL
CALCIUM: 9.5 mg/dL
CHLORIDE: 108 mmol/L
Co2: 24 mmol/L
Creatinine: 1.12 mg/dL
EGFR (African American): >60
EGFR (Non-African Amer.): >60
Glucose: 91 mg/dL
POTASSIUM: 4 mmol/L
Sodium: 139 mmol/L

## 2014-03-26 LAB — GC/CHLAMYDIA PROBE AMP

## 2014-03-26 IMAGING — CT CT ABD-PELV W/O CM
1 of 4 series · 5 of 46 positions shown, 10 images · non-contrast
Comparison: [DATE]

CLINICAL DATA: Flank pain radiating to left inguinal region for 4
days. Hematuria

EXAM:
CT ABDOMEN AND PELVIS WITHOUT CONTRAST
TECHNIQUE: Multidetector CT imaging of the abdomen and pelvis was performed
following the standard protocol without or intravenous contrast
material administration.

[Series 4: lung windows · axial · 0.68mm/px · z∈[-46,+29]mm · 5 of 23 slices shown, 10 images]
[im 4/23  soft-tissue]
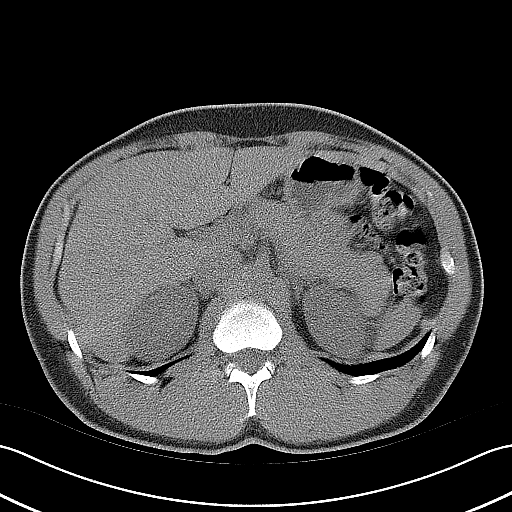
[im 4/23  bone]
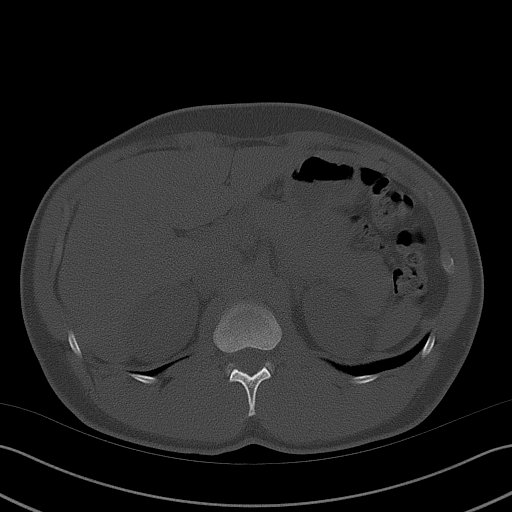
[im 8/23  soft-tissue]
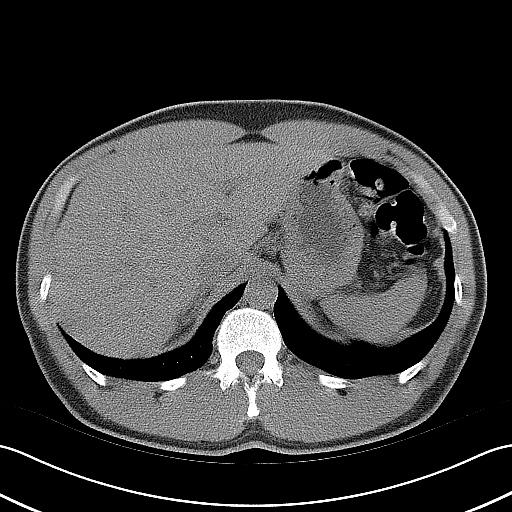
[im 8/23  lung]
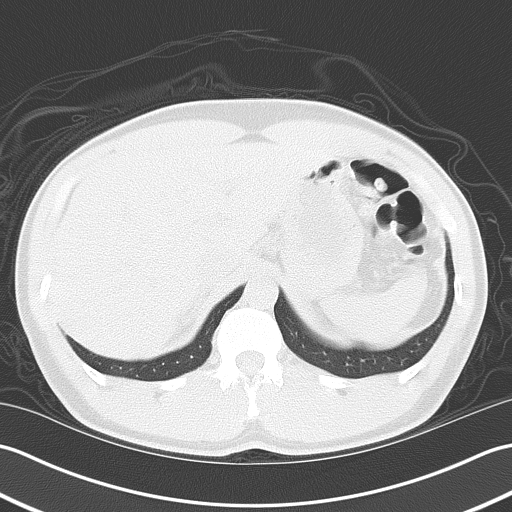
[im 12/23  soft-tissue]
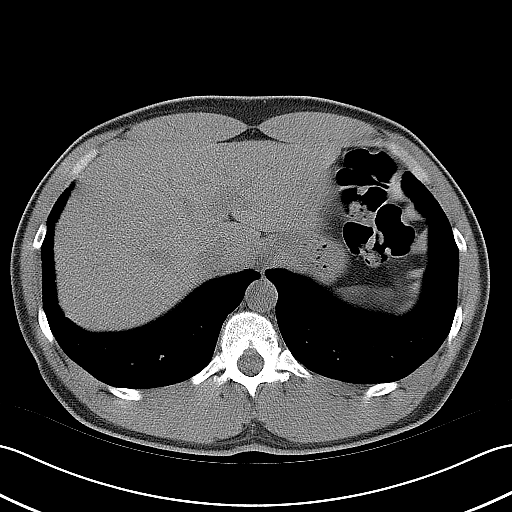
[im 12/23  lung]
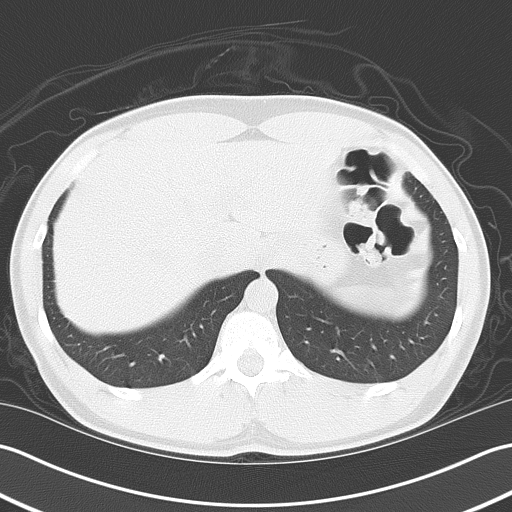
[im 15/23  soft-tissue]
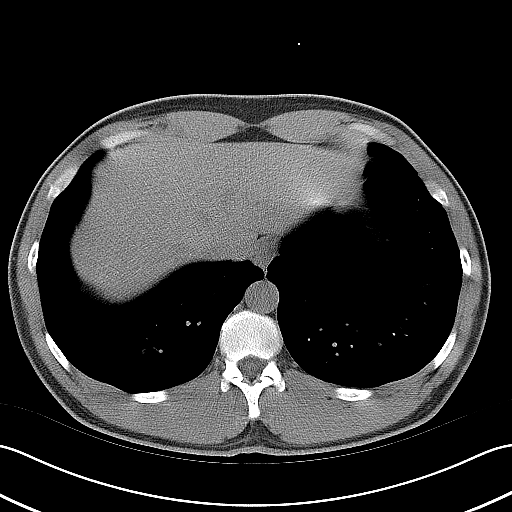
[im 15/23  lung]
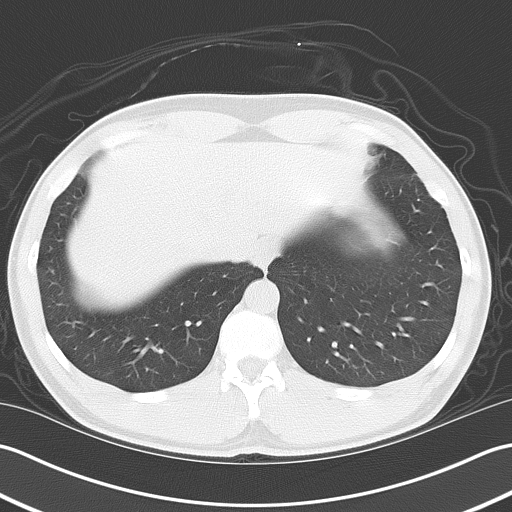
[im 19/23  soft-tissue]
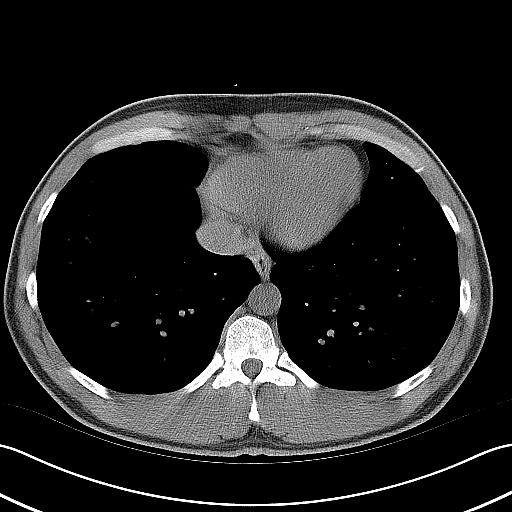
[im 19/23  lung]
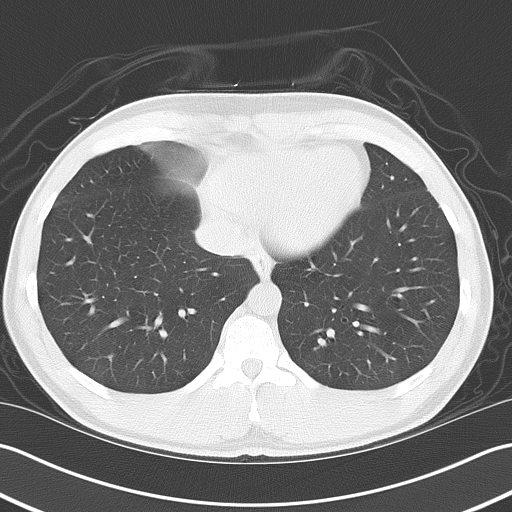

[5 of 46 positions shown; findings below may reference images not displayed]

FINDINGS: Lung bases are clear.

No focal liver lesions are identified on this noncontrast enhanced
study. Gallbladder wall is not appreciably thickened. There is no
biliary duct dilatation.

Spleen, pancreas, and adrenals appear normal.

Kidneys bilaterally show no mass or hydronephrosis on either side.
There is no renal or ureteral calculus on either side.

In the pelvis, the urinary bladder is midline with normal wall
thickness. There is no pelvic mass or pelvic fluid collection. The
appendix appears normal. The terminal ileum appears normal.

There is no bowel obstruction. No free air or portal venous air.
There is fairly diffuse stool throughout colon.

There is no ascites, adenopathy, or abscess in the abdomen or
pelvis. There is atherosclerotic change in the aorta and iliac
arteries without aneurysm. There are no blastic or lytic bone
lesions.
IMPRESSION: No renal or ureteral calculus. No hydronephrosis. A cause for the
patient's symptoms has not been established with this study.

There is fairly diffuse stool throughout the colon. There is no
bowel obstruction. No abscess. The appendix region appears normal.

There is atherosclerotic change in the aorta and iliac arteries
without aneurysm.

## 2014-04-23 NOTE — Op Note (Signed)
PATIENT NAME:  Edward Owens, Edward Owens MR#:  [id number] DATE OF BIRTH:  02-14-1975  DATE OF PROCEDURE:  06/12/2012  REASON FOR SURGERY:  Left inguinal bulge and discomfort.   PREOPERATIVE DIAGNOSIS:  Left inguinal hernia.   POSTOPERATIVE DIAGNOSIS: Left inguinal hernia and left inguinal lymphadenopathy.   PROCEDURE PERFORMED:  Left inguinal hernia and left inguinal lymph node excision.   SURGEON: Ida Roguehristopher Kenzli Barritt, MD  ESTIMATED BLOOD LOSS: 10 mL.  COMPLICATIONS: None.   SPECIMENS: Left inguinal lymph node.   HISTORY OF PRESENT ILLNESS: Edward Owens is a pleasant 39 year old male with history of left inguinal bulge and tenderness who I had seen in the office. He was evaluated and felt to have an inguinal hernia and potentially a femoral hernia. He was thus brought to the operating room suite for inguinal hernia repair.   DETAILS OF PROCEDURE: Informed consent was obtained. Edward Owens was brought to the operating room suite. He was induced. Endotracheal tube was placed. General anesthesia was administered. His left groin was prepped and draped in standard surgical fashion. A timeout was then performed correctly identifying the patient name, operative site and procedure to be performed. A left groin incision was made. This was deepened down to the aponeurosis of the external oblique through Scarpa's and Camper's fascia. The oblique was cleaned off. It was incised with a 15 blade knife. It was opened proximally and distally to the external ring using a pair of scissors. The cord was then surrounded with a gloved finger. A Penrose drain was then placed around the cord. The cord was then dissected out and noted to not have an indirect hernia sac and the peritoneal reflection could be found at the internal ring. The direct floor was quite weak however and may be at least part a concern that he had for the bulge. I did notice the bulge appeared to be near his inguinal ligament on physical exam and then  felt underneath his inguinal ligament and did feel a nodule that I felt was a femoral hernia. It was dissected out and found to be an enlarged inguinal lymph node. This lymph node was excised and sent to pathology. There was no obvious femoral hernia noted at that time. I then placed a piece of mesh to repair his direct hernia.  I anchored the mesh with 0 Ethibond at the pubic symphysis. I then attached the hernia laterally to the shelving edge of inguinal ligament laterally and medially to the conjoined tendon. I then reapproximated the internal ring by attaching the tails together with the cord plus adjacent tip of the Kelly clamp with a 0 Vicryl.  The repair was felt to be satisfactory. I then irrigated the groin and examined for hemostasis. After I assured this was hemostatic, I then used a running 3-0 Vicryl to close his aponeurosis of the external oblique and re-create the external ring. I then closed Scarpa's fascia with a running 3-0 Vicryl. I then ran a running 3-0 deep dermal suture and then a 4-0 Monocryl subcuticular. I then placed Steri-Strips, Telfa gauze and Tegaderm over his wound.  He was then awoken, extubated and brought to the postanesthesia care unit. There were no immediate complications. Needle, sponge and instrument counts were correct at the end of the procedure.  ____________________________ Edward Raiderhristopher A. Beaumont Austad, MD cal:sb D: 06/12/2012 11:05:03 ET T: 06/12/2012 12:04:47 ET JOB#: [id number]  cc: Cristal Deerhristopher A. Rosendo Couser, MD, <Dictator> Edward NewcomerHRISTOPHER A Rawlin Reaume MD ELECTRONICALLY SIGNED 06/19/2012 16:52

## 2014-06-05 ENCOUNTER — Emergency Department
Admission: EM | Admit: 2014-06-05 | Discharge: 2014-06-05 | Disposition: A | Discharge status: Home / Self Care | Payer: Self-pay | Attending: Emergency Medicine | Admitting: Emergency Medicine

## 2014-06-05 ENCOUNTER — Encounter: Payer: Self-pay | Admitting: Emergency Medicine

## 2014-06-05 DIAGNOSIS — L089 Local infection of the skin and subcutaneous tissue, unspecified: Secondary | ICD-10-CM

## 2014-06-05 DIAGNOSIS — W57XXXA Bitten or stung by nonvenomous insect and other nonvenomous arthropods, initial encounter: Secondary | ICD-10-CM | POA: Insufficient documentation

## 2014-06-05 DIAGNOSIS — Y9389 Activity, other specified: Secondary | ICD-10-CM | POA: Insufficient documentation

## 2014-06-05 DIAGNOSIS — Z23 Encounter for immunization: Secondary | ICD-10-CM | POA: Insufficient documentation

## 2014-06-05 DIAGNOSIS — S20461A Insect bite (nonvenomous) of right back wall of thorax, initial encounter: Secondary | ICD-10-CM | POA: Insufficient documentation

## 2014-06-05 DIAGNOSIS — Y9289 Other specified places as the place of occurrence of the external cause: Secondary | ICD-10-CM | POA: Insufficient documentation

## 2014-06-05 DIAGNOSIS — Z720 Tobacco use: Secondary | ICD-10-CM | POA: Insufficient documentation

## 2014-06-05 DIAGNOSIS — Y998 Other external cause status: Secondary | ICD-10-CM | POA: Insufficient documentation

## 2014-06-05 DIAGNOSIS — L0889 Other specified local infections of the skin and subcutaneous tissue: Secondary | ICD-10-CM | POA: Insufficient documentation

## 2014-06-05 MED ORDER — HYDROXYZINE HCL 50 MG PO TABS
50.0000 mg | ORAL_TABLET | Freq: Once | ORAL | Status: AC
  Administered 2014-06-05: 50 mg via ORAL

## 2014-06-05 MED ORDER — DEXAMETHASONE SODIUM PHOSPHATE 10 MG/ML IJ SOLN
INTRAMUSCULAR | Status: AC
  Administered 2014-06-05: 10 mg via INTRAMUSCULAR
  Filled 2014-06-05: qty 1

## 2014-06-05 MED ORDER — DEXAMETHASONE SODIUM PHOSPHATE 10 MG/ML IJ SOLN
10.0000 mg | Freq: Once | INTRAMUSCULAR | Status: AC
  Administered 2014-06-05: 10 mg via INTRAMUSCULAR

## 2014-06-05 MED ORDER — HYDROXYZINE HCL 50 MG PO TABS: 50.0000 mg | ORAL_TABLET | Freq: Three times a day (TID) | ORAL | Status: DC | PRN

## 2014-06-05 MED ORDER — TETANUS-DIPHTHERIA TOXOIDS TD 5-2 LFU IM INJ
INJECTION | INTRAMUSCULAR | Status: AC
  Administered 2014-06-05: 0.5 mL via INTRAMUSCULAR
  Filled 2014-06-05: qty 0.5

## 2014-06-05 MED ORDER — HYDROXYZINE HCL 50 MG PO TABS
ORAL_TABLET | ORAL | Status: AC
  Administered 2014-06-05: 50 mg via ORAL
  Filled 2014-06-05: qty 1

## 2014-06-05 MED ORDER — TETANUS-DIPHTHERIA TOXOIDS TD 5-2 LFU IM INJ
0.5000 mL | INJECTION | Freq: Once | INTRAMUSCULAR | Status: AC
  Administered 2014-06-05: 0.5 mL via INTRAMUSCULAR

## 2014-06-05 MED ORDER — SULFAMETHOXAZOLE-TRIMETHOPRIM 800-160 MG PO TABS: 1.0000 | ORAL_TABLET | Freq: Two times a day (BID) | ORAL | Status: DC

## 2014-06-05 NOTE — Discharge Instructions (Signed)
Take medications as directed

## 2014-06-05 NOTE — ED Notes (Addendum)
Pt states he was bite a couple of days ago and it started out as a small pimple which has now become itchy and red and larger.  Rash is around the site as well to right upper back. Pt states he put neosporin on it and alcohol as well.

## 2014-06-05 NOTE — ED Notes (Signed)
Patient states that he was bit by an insect 2 days ago. Patient with a a red raised area to his right upper back.

## 2014-06-05 NOTE — ED Provider Notes (Signed)
[facility] Emergency Department Provider Note  ____________________________________________  Time seen: Approximately 9:19 PM  I have reviewed the triage vital signs and the nursing notes.   HISTORY  Chief Complaint Insect Bite    HPI Edward Owens is a 39 y.o. male patient complaining of swelling and redness to the right upper scapular area secondary to multiple insect bites couple days ago. Patient states the area was itching really bad and he put alcohol and Neosporin. States the area has worsened. She denies any fever or chills. He has no nausea vomiting diarrhea. The patient denies myalgia or arthralgias says it onset insect bites. States dental nausea vomiting or diarrhea.   No past medical history on file.  There are no active problems to display for this patient.   No past surgical history on file.  Current Outpatient Rx  Name  Route  Sig  Dispense  Refill  . hydrOXYzine (ATARAX/VISTARIL) 50 MG tablet   Oral   Take 1 tablet (50 mg total) by mouth 3 (three) times daily as needed.   30 tablet   0   . sulfamethoxazole-trimethoprim (BACTRIM DS,SEPTRA DS) 800-160 MG per tablet   Oral   Take 1 tablet by mouth 2 (two) times daily.   20 tablet   0     Allergies Review of patient's allergies indicates no known allergies.  No family history on file.  Social History History  Substance Use Topics  . Smoking status: Current Every Day Smoker -- 0.50 packs/day for 15 years    Types: Cigarettes  . Smokeless tobacco: Not on file  . Alcohol Use: No    Review of Systems Constitutional: No fever/chills Eyes: No visual changes. ENT: No sore throat. Cardiovascular: Denies chest pain. Respiratory: Denies shortness of breath. Gastrointestinal: No abdominal pain.  No nausea, no vomiting.  No diarrhea.  No constipation. Genitourinary: Negative for dysuria. Musculoskeletal: Negative for back pain. Skin: Rash and swelling from insect  bites Neurological: Negative for headaches, focal weakness or numbness.  10-point ROS otherwise negative.  ____________________________________________   PHYSICAL EXAM:  VITAL SIGNS: ED Triage Vitals  Enc Vitals Group     BP 06/05/14 2020 132/70 mmHg     Pulse Rate 06/05/14 2020 86     Resp 06/05/14 2020 18     Temp 06/05/14 2020 98.4 F (36.9 C)     Temp Source 06/05/14 2020 Oral     SpO2 06/05/14 2020 97 %     Weight 06/05/14 2020 182 lb (82.555 kg)     Height 06/05/14 2020  (1.727 m)     Head Cir --      Peak Flow --      Pain Score 06/05/14 2021 7     Pain Loc --      Pain Edu? --      Excl. in GC? --    Constitutional: Alert and oriented. Well appearing and in no acute distress. Eyes: Conjunctivae are normal. PERRL. EOMI. Head: Atraumatic. Nose: No congestion/rhinnorhea. Mouth/Throat: Mucous membranes are moist.  Oropharynx non-erythematous. Neck: No stridor.{**Hematological/Lymphatic/Immunilogical: No cervical lymphadenopathy. Cardiovascular: Normal rate, regular rhythm. Grossly normal heart sounds.  Good peripheral circulation. Respiratory: Normal respiratory effort.  No retractions. Lungs CTAB. Gastrointestinal: Soft and nontender. No distention. No abdominal bruits. No CVA tenderness. Genitourinary:  Musculoskeletal: No lower extremity tenderness nor edema.  No joint effusions. Neurologic:  Normal speech and language. No gross focal neurologic deficits are appreciated. Speech is normal. No gait instability. Skin:  Macular papular lesions on erythematous base right upper scapulary area. No discharge but the patient is tender palpation in this area. Psychiatric: Mood and affect are normal. Speech and behavior are normal.  ____________________________________________   LABS (all labs ordered are listed, but only abnormal results are displayed)  Labs Reviewed - No data to  display ____________________________________________  EKG   ____________________________________________  RADIOLOGY   ____________________________________________   PROCEDURES  Procedure(s) performed: None  Critical Care performed: No  ____________________________________________   INITIAL IMPRESSION / ASSESSMENT AND PLAN / ED COURSE  Pertinent labs & imaging results that were available during my care of the patient were reviewed by me and considered in my medical decision making (see chart for details).  Skin infection secondary to insect bites. ____________________________________________   FINAL CLINICAL IMPRESSION(S) / ED DIAGNOSES  Final diagnoses:  Insect bite of back, infected, right, initial encounter      Joni ReiningRonald K Smith, PA-C 06/05/14 2122  Phineas SemenGraydon Goodman, MD 06/05/14 2144

## 2014-06-05 NOTE — ED Notes (Signed)
Patient with no complaints at this time. Respirations even and unlabored. Skin warm/dry. Discharge instructions reviewed with patient at this time. Patient given opportunity to voice concerns/ask questions. Patient discharged at this time and left Emergency Department with steady gait.   

## 2015-06-18 ENCOUNTER — Encounter: Payer: Self-pay | Admitting: *Deleted

## 2015-06-18 ENCOUNTER — Emergency Department
Admission: EM | Admit: 2015-06-18 | Discharge: 2015-06-18 | Disposition: A | Discharge status: Home / Self Care | Payer: Self-pay | Attending: Emergency Medicine | Admitting: Emergency Medicine

## 2015-06-18 ENCOUNTER — Emergency Department: Payer: Self-pay

## 2015-06-18 DIAGNOSIS — Z79899 Other long term (current) drug therapy: Secondary | ICD-10-CM | POA: Insufficient documentation

## 2015-06-18 DIAGNOSIS — F17210 Nicotine dependence, cigarettes, uncomplicated: Secondary | ICD-10-CM | POA: Insufficient documentation

## 2015-06-18 DIAGNOSIS — G43001 Migraine without aura, not intractable, with status migrainosus: Secondary | ICD-10-CM | POA: Insufficient documentation

## 2015-06-18 DIAGNOSIS — G43909 Migraine, unspecified, not intractable, without status migrainosus: Secondary | ICD-10-CM | POA: Insufficient documentation

## 2015-06-18 DIAGNOSIS — G43901 Migraine, unspecified, not intractable, with status migrainosus: Secondary | ICD-10-CM

## 2015-06-18 HISTORY — DX: Migraine, unspecified, not intractable, without status migrainosus: G43.909

## 2015-06-18 LAB — SEDIMENTATION RATE: Sed Rate: 21 mm/hr — ABNORMAL HIGH (ref 0–15)

## 2015-06-18 LAB — BASIC METABOLIC PANEL
Anion gap: 8 (ref 5–15)
BUN: 12 mg/dL (ref 6–20)
CHLORIDE: 109 mmol/L (ref 101–111)
CO2: 24 mmol/L (ref 22–32)
CREATININE: 1.15 mg/dL (ref 0.61–1.24)
Calcium: 9.5 mg/dL (ref 8.9–10.3)
GFR calc Af Amer: >60 mL/min (ref >60)
GFR calc non Af Amer: >60 mL/min (ref >60)
GLUCOSE: 112 mg/dL — AB (ref 65–99)
Potassium: 4.2 mmol/L (ref 3.5–5.1)
SODIUM: 141 mmol/L (ref 135–145)

## 2015-06-18 IMAGING — CT CT HEAD W/O CM
3 series · 15 of 45 positions shown, 18 images · non-contrast
Comparison: Brain CT [DATE].

CLINICAL DATA: Patient with extreme headache for 3 weeks.

EXAM:
CT HEAD WITHOUT CONTRAST
TECHNIQUE: Contiguous axial images were obtained from the base of the skull
through the vertex without intravenous contrast.

[Series 2: head wo · axial · 0.41mm/px · z∈[-123,-8]mm · 9 of 28 slices shown, 12 images]
[im 3/28  brain]
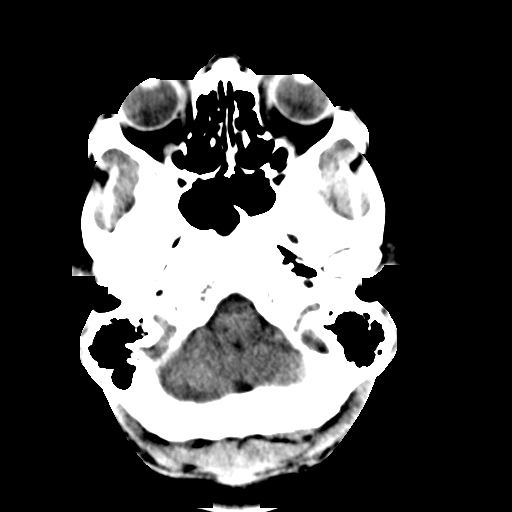
[im 3/28  bone]
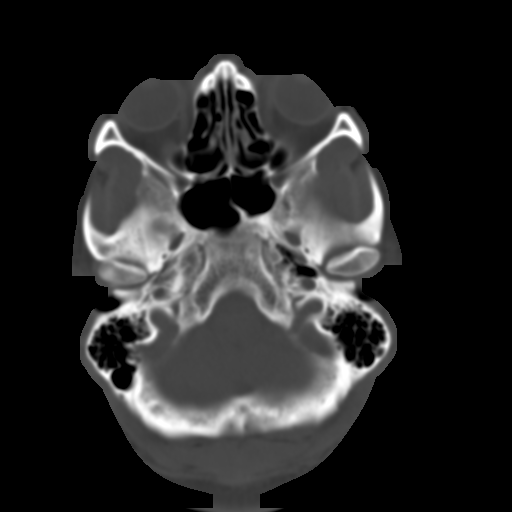
[im 6/28  brain]
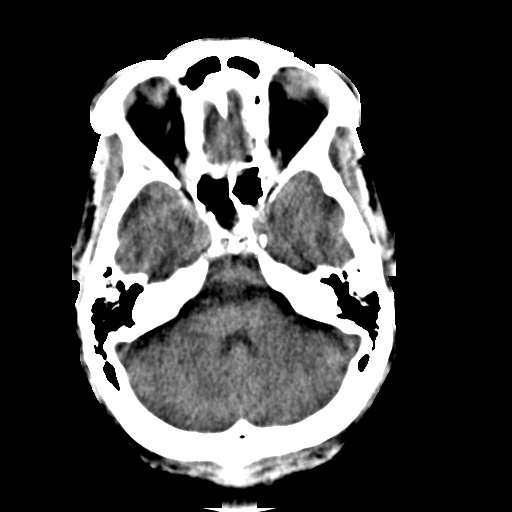
[im 9/28  brain]
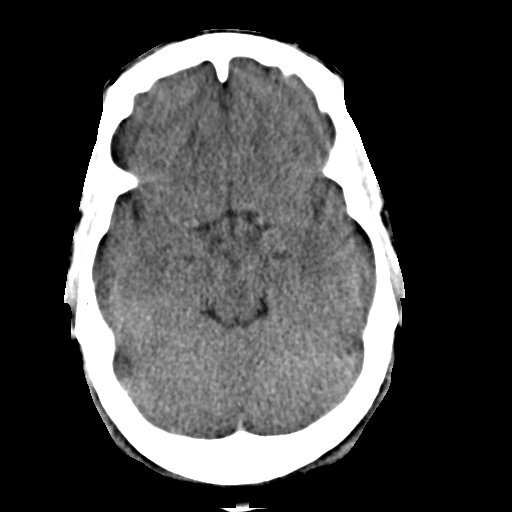
[im 12/28  brain]
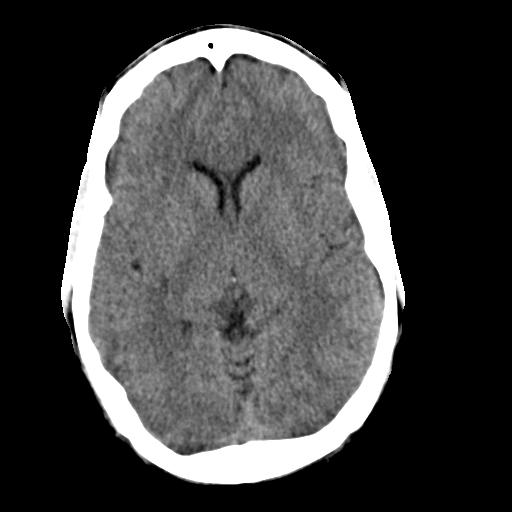
[im 15/28  brain]
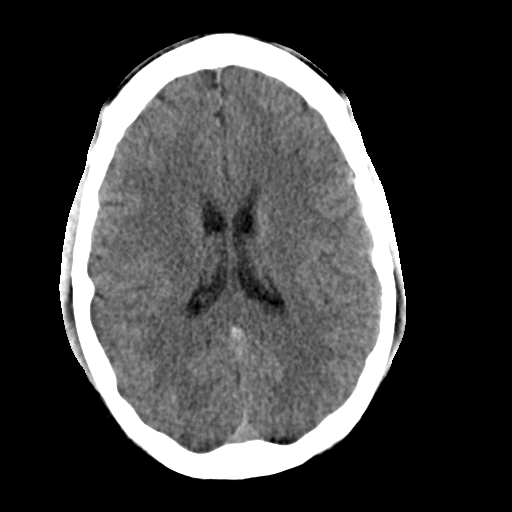
[im 15/28  bone]
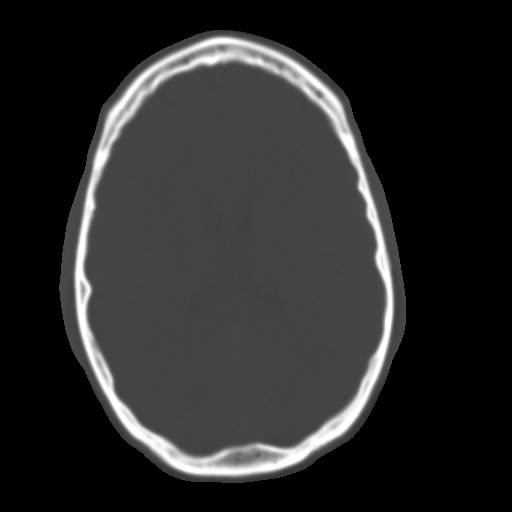
[im 17/28  brain]
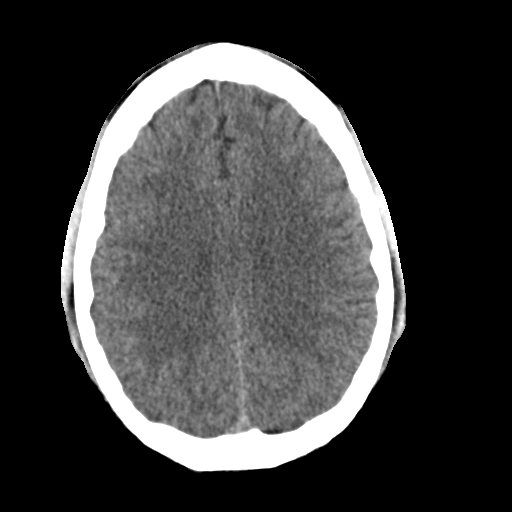
[im 20/28  brain]
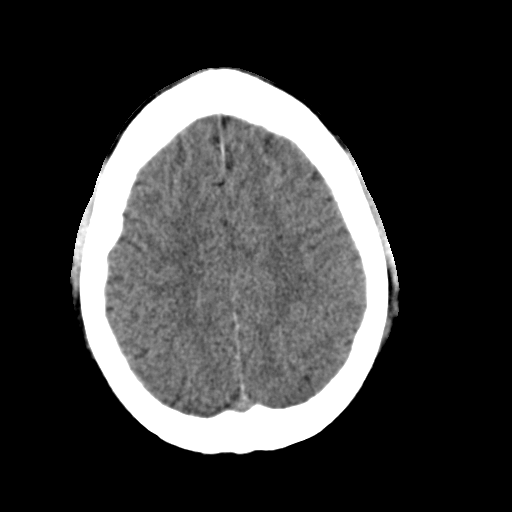
[im 23/28  brain]
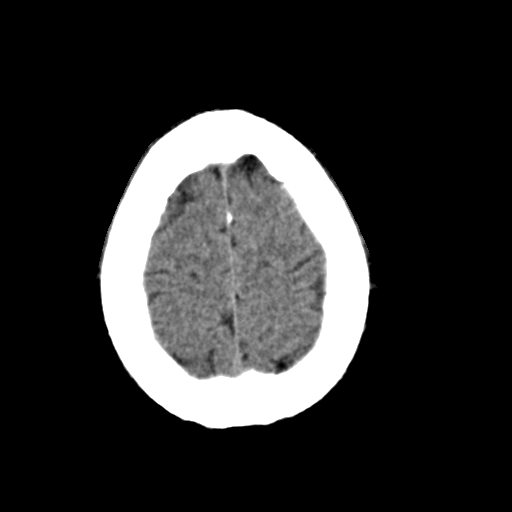
[im 26/28  brain]
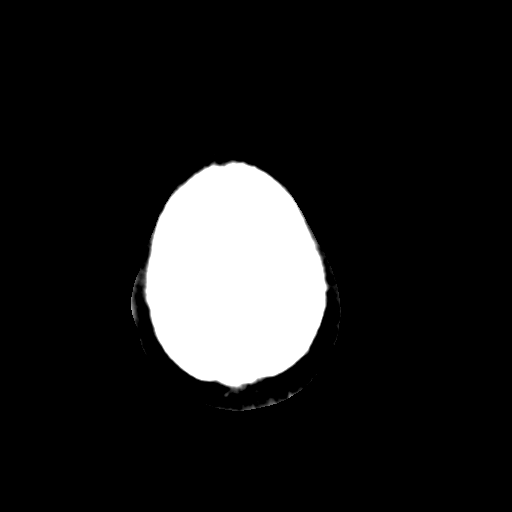
[im 26/28  bone]
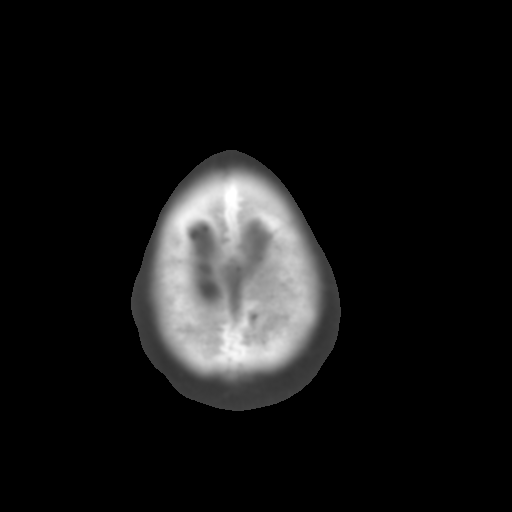

[Series 4: coronal soft · coronal · 0.28mm/px · 3 of 63 slices shown]
[im 21/63  brain]
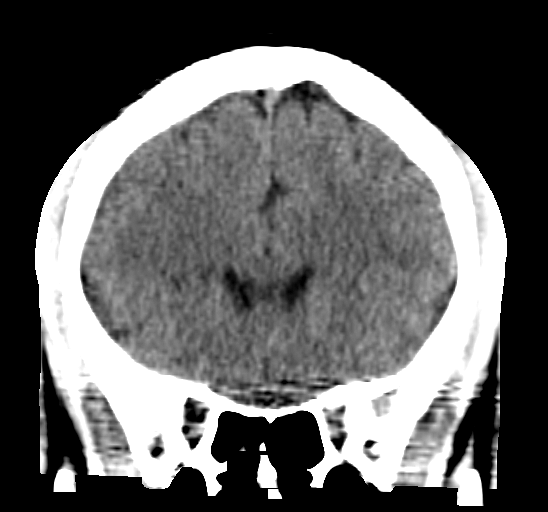
[im 28/63  brain]
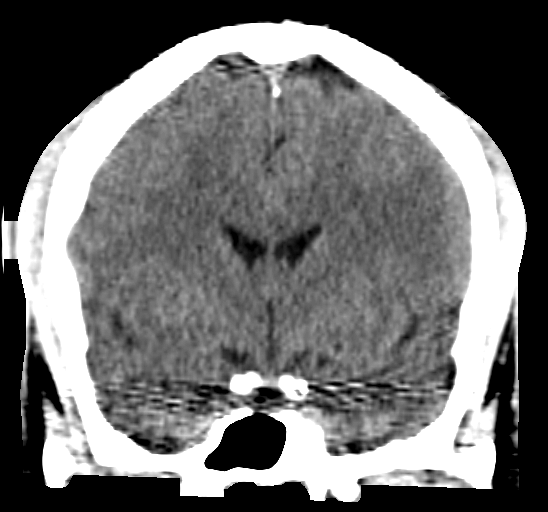
[im 35/63  brain]
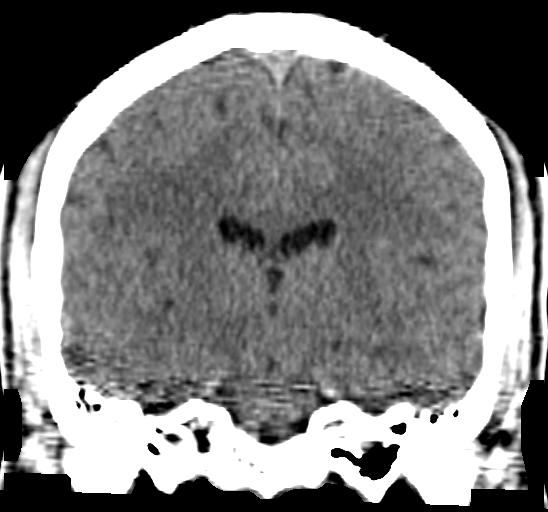

[Series 5: sagittal soft · sagittal · 0.27mm/px · 3 of 49 slices shown]
[im 17/49  brain]
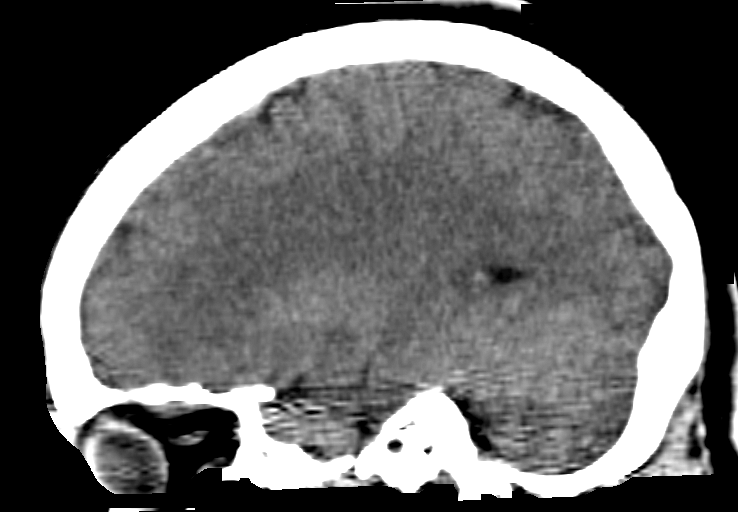
[im 25/49  brain]
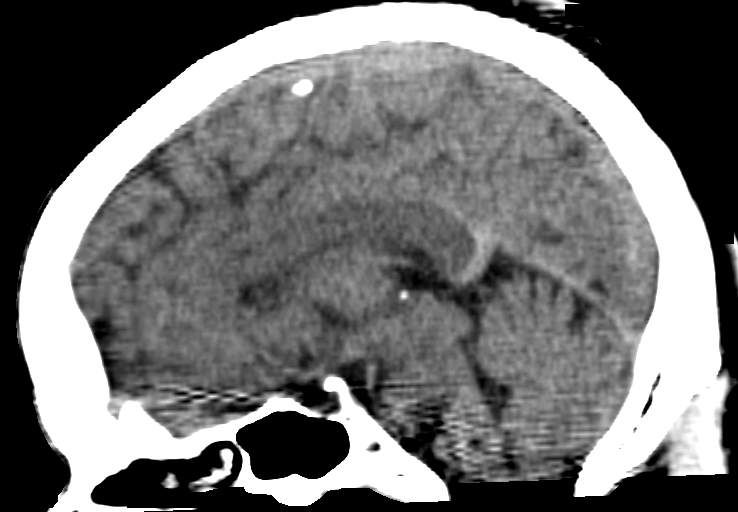
[im 33/49  brain]
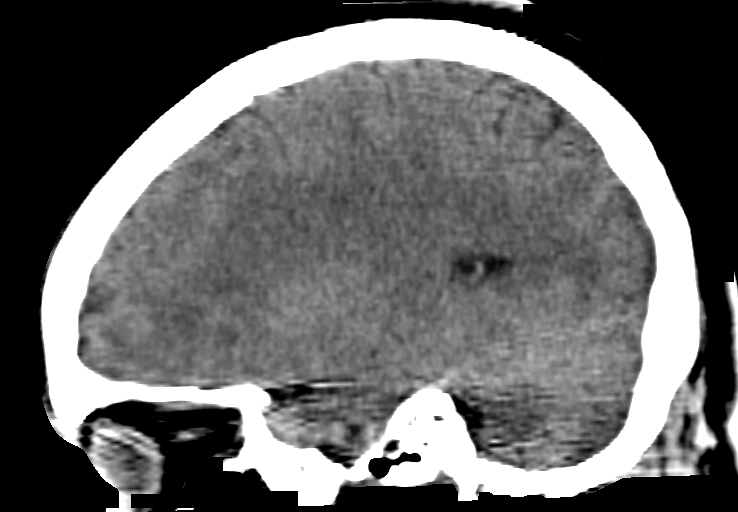

[15 of 45 positions shown; findings below may reference images not displayed]

FINDINGS: Ventricles and sulci are appropriate for patient's age. No evidence
for acute cortically based infarct, intracranial hemorrhage, mass
lesion or mass-effect. Orbits are unremarkable. Paranasal sinuses
and mastoid air cells unremarkable. Calvarium is intact.
IMPRESSION: No acute intracranial process.

## 2015-06-18 MED ORDER — DIPHENHYDRAMINE HCL 50 MG/ML IJ SOLN
25.0000 mg | Freq: Once | INTRAMUSCULAR | Status: AC
  Administered 2015-06-18: 25 mg via INTRAVENOUS
  Filled 2015-06-18: qty 1

## 2015-06-18 MED ORDER — MORPHINE SULFATE (PF) 4 MG/ML IV SOLN
4.0000 mg | Freq: Once | INTRAVENOUS | Status: AC
  Administered 2015-06-18: 4 mg via INTRAVENOUS
  Filled 2015-06-18: qty 1

## 2015-06-18 MED ORDER — KETOROLAC TROMETHAMINE 30 MG/ML IJ SOLN
15.0000 mg | Freq: Once | INTRAMUSCULAR | Status: AC
  Administered 2015-06-18: 15 mg via INTRAVENOUS
  Filled 2015-06-18: qty 1

## 2015-06-18 MED ORDER — METOCLOPRAMIDE HCL 5 MG/ML IJ SOLN
10.0000 mg | Freq: Once | INTRAMUSCULAR | Status: AC
  Administered 2015-06-18: 10 mg via INTRAVENOUS
  Filled 2015-06-18: qty 2

## 2015-06-18 MED ORDER — SODIUM CHLORIDE 0.9 % IV BOLUS (SEPSIS)
1000.0000 mL | Freq: Once | INTRAVENOUS | Status: AC
  Administered 2015-06-18: 1000 mL via INTRAVENOUS
  Filled 2015-06-18: qty 1000

## 2015-06-18 NOTE — Discharge Instructions (Signed)
You have been seen in the Emergency Department (ED) for a headache.  Please use Tylenol or Motrin as needed for symptoms, but only as written on the box.   As we have discussed, please follow up with your primary care doctor as soon as possible regarding todays Emergency Department (ED) visit and your headache symptoms.    Call your doctor or return to the ED if you have a worsening headache, sudden and severe headache, confusion, slurred speech, facial droop, weakness or numbness in any arm or leg, extreme fatigue, vision problems, or other symptoms that concern you.   Migraine Headache A migraine headache is an intense, throbbing pain on one or both sides of your head. A migraine can last for 30 minutes to several hours. CAUSES  The exact cause of a migraine headache is not always known. However, a migraine may be caused when nerves in the brain become irritated and release chemicals that cause inflammation. This causes pain. Certain things may also trigger migraines, such as:  Alcohol.  Smoking.  Stress.  Menstruation.  Aged cheeses.  Foods or drinks that contain nitrates, glutamate, aspartame, or tyramine.  Lack of sleep.  Chocolate.  Caffeine.  Hunger.  Physical exertion.  Fatigue.  Medicines used to treat chest pain (nitroglycerine), birth control pills, estrogen, and some blood pressure medicines. SIGNS AND SYMPTOMS  Pain on one or both sides of your head.  Pulsating or throbbing pain.  Severe pain that prevents daily activities.  Pain that is aggravated by any physical activity.  Nausea, vomiting, or both.  Dizziness.  Pain with exposure to bright lights, loud noises, or activity.  General sensitivity to bright lights, loud noises, or smells. Before you get a migraine, you may get warning signs that a migraine is coming (aura). An aura may include:  Seeing flashing lights.  Seeing bright spots, halos, or zigzag lines.  Having tunnel vision or  blurred vision.  Having feelings of numbness or tingling.  Having trouble talking.  Having muscle weakness. DIAGNOSIS  A migraine headache is often diagnosed based on:  Symptoms.  Physical exam.  A CT scan or MRI of your head. These imaging tests cannot diagnose migraines, but they can help rule out other causes of headaches. TREATMENT Medicines may be given for pain and nausea. Medicines can also be given to help prevent recurrent migraines.  HOME CARE INSTRUCTIONS  Only take over-the-counter or prescription medicines for pain or discomfort as directed by your health care provider. The use of long-term narcotics is not recommended.  Lie down in a dark, quiet room when you have a migraine.  Keep a journal to find out what may trigger your migraine headaches. For example, write down:  What you eat and drink.  How much sleep you get.  Any change to your diet or medicines.  Limit alcohol consumption.  Quit smoking if you smoke.  Get 7-9 hours of sleep, or as recommended by your health care provider.  Limit stress.  Keep lights dim if bright lights bother you and make your migraines worse. SEEK IMMEDIATE MEDICAL CARE IF:   Your migraine becomes severe.  You have a fever.  You have a stiff neck.  You have vision loss.  You have muscular weakness or loss of muscle control.  You start losing your balance or have trouble walking.  You feel faint or pass out.  You have severe symptoms that are different from your first symptoms. MAKE SURE YOU:   Understand these instructions.  Will watch your condition.  Will get help right away if you are not doing well or get worse.   This information is not intended to replace advice given to you by your health care provider. Make sure you discuss any questions you have with your health care provider.   Document Released: 12/18/2004 Document Revised: 01/08/2014 Document Reviewed: 08/25/2012 Elsevier Interactive Patient  Education [employer]

## 2015-06-18 NOTE — ED Notes (Signed)
Patient transported to CT 

## 2015-06-18 NOTE — ED Notes (Signed)
Pt reports worst headache ever, states ongoing for the past 3 weeks, nothing otc is helping, pt states he woke his wife up crying in the night with the pain, pt states that it is mostly the left side of his head and reports that his left ear drum feels like its going to bust. +fever according to wife since Monday, pt repots he has been near syncopal multiple times.

## 2015-06-18 NOTE — ED Notes (Signed)
Pt c/o feeling "fidgety" after med administration. MD aware and at bedside.

## 2015-06-18 NOTE — ED Provider Notes (Signed)
[facility] Emergency Department Provider Note  ____________________________________________  Time seen: Approximately 11:04 AM  I have reviewed the triage vital signs and the nursing notes.   HISTORY  Chief Complaint Headache    HPI Edward Owens is a 40 y.o. male reports a previous history of headaches.  Patient tells me that about 3 weeks ago began having a pounding left-sided headache. He describes as a "migraine" and he gets similar headaches about 3-4 times every 6 months that usually last less than a day or 2. He's never had one that lasted this long. He denies having had any fever, neck stiffness, confusion, weakness, trouble talking, numbness or tingling. He does report that bright lights and sounds irritate and make the headache slightly worse. He says his left eye has felt like he was tearing at times, and this occurs during his headaches previously  No vomiting, mild nausea. No chest pain or trouble breathing.  No trouble walking  Tried over-the-counter medications the last few days with no effect. Past Medical History  Diagnosis Date  . Migraine     Patient Active Problem List   Diagnosis Date Noted  . Migraine     Past Surgical History  Procedure Laterality Date  . Hernia repair    . Lymph node dissection Left     groin    Current Outpatient Rx  Name  Route  Sig  Dispense  Refill  . hydrOXYzine (ATARAX/VISTARIL) 50 MG tablet   Oral   Take 1 tablet (50 mg total) by mouth 3 (three) times daily as needed.   30 tablet   0   . sulfamethoxazole-trimethoprim (BACTRIM DS,SEPTRA DS) 800-160 MG per tablet   Oral   Take 1 tablet by mouth 2 (two) times daily.   20 tablet   0     Allergies Review of patient's allergies indicates no known allergies.  No family history on file.  Social History Social History  Substance Use Topics  . Smoking status: Current Every Day Smoker -- 0.50 packs/day for 15 years    Types: Cigarettes   . Smokeless tobacco: Not on file  . Alcohol Use: No    Review of Systems Constitutional: No fever/chills Eyes: No visual changes.Light makes headache worse. ENT: No sore throat. Cardiovascular: Denies chest pain. Respiratory: Denies shortness of breath. Gastrointestinal: No abdominal pain.  No vomiting.  No diarrhea.  No constipation. Musculoskeletal: Negative for back pain. Skin: Negative for rash. Neurological: Negative for focal weakness or numbness.  10-point ROS otherwise negative.  ____________________________________________   PHYSICAL EXAM:  VITAL SIGNS: ED Triage Vitals  Enc Vitals Group     BP 06/18/15 0859 165/96 mmHg     Pulse Rate 06/18/15 0859 85     Resp 06/18/15 0859 18     Temp 06/18/15 0859 98.3 F (36.8 C)     Temp Source 06/18/15 0859 Oral     SpO2 06/18/15 0859 99 %     Weight 06/18/15 0859 187 lb (84.823 kg)     Height 06/18/15 0859 _0  (1.753 m)     Head Cir --      Peak Flow --      Pain Score 06/18/15 0859 10     Pain Loc --      Pain Edu? --      Excl. in [place]? --    Constitutional: Alert and oriented. Well appearing and in no acute distress. Eyes: Conjunctivae are normal. PERRL. EOMI. Head: Atraumatic. Nose: No  congestion/rhinnorhea. Mouth/Throat: Mucous membranes are moist.  Oropharynx non-erythematous. Neck: No stridor.  No meningismus. Negative jolt accentuation. There is mild tenderness over the left temporal artery, however it has normal pulsations and is equal and pulsations to that on the right which is nontender. Cardiovascular: Normal rate, regular rhythm. Grossly normal heart sounds.  Good peripheral circulation. Respiratory: Normal respiratory effort.  No retractions. Lungs CTAB. Gastrointestinal: Soft and nontender. No distention. No abdominal bruits. No CVA tenderness. Musculoskeletal: No lower extremity tenderness nor edema.  No joint effusions. Neurologic:  Normal speech and language. No gross focal neurologic deficits are  appreciated.   NIH score equals 0, performed by me at bedside. The patient has no pronator drift. The patient has normal cranial nerve exam. Extraocular movements are normal. Visual fields are normal. Patient has 5 out of 5 strength in all extremities. There is no numbness or gross, acute sensory abnormality in the extremities bilaterally. No speech disturbance. No dysarthria. No aphasia. No ataxia. Normal finger nose finger bilat. Patient speaking in full and clear sentences.   Skin:  Skin is warm, dry and intact. No rash noted. Psychiatric: Mood and affect are normal. Speech and behavior are normal.  ____________________________________________   LABS (all labs ordered are listed, but only abnormal results are displayed)  Labs Reviewed  BASIC METABOLIC PANEL - Abnormal; Notable for the following:    Glucose, Bld 112 (*)    All other components within normal limits  SEDIMENTATION RATE - Abnormal; Notable for the following:    Sed Rate 21 (*)    All other components within normal limits   ____________________________________________  EKG   ____________________________________________  RADIOLOGY  CT Head Wo Contrast (Final result) Result time: 06/18/15 11:08:54   Final result by Rad Results In Interface (06/18/15 11:08:54)   Narrative:   CLINICAL DATA: Patient with extreme headache for 3 weeks.  EXAM: CT HEAD WITHOUT CONTRAST  TECHNIQUE: Contiguous axial images were obtained from the base of the skull through the vertex without intravenous contrast.  COMPARISON: Brain CT 02/01/2013.  FINDINGS: Ventricles and sulci are appropriate for patient's age. No evidence for acute cortically based infarct, intracranial hemorrhage, mass lesion or mass-effect. Orbits are unremarkable. Paranasal sinuses and mastoid air cells unremarkable. Calvarium is intact.  IMPRESSION: No acute intracranial process.   Electronically Signed By: Lovey Newcomer M.D. On:  06/18/2015 11:08    ____________________________________________   PROCEDURES  Procedure(s) performed: None  Critical Care performed: No  ____________________________________________   INITIAL IMPRESSION / ASSESSMENT AND PLAN / ED COURSE  Pertinent labs & imaging results that were available during my care of the patient were reviewed by me and considered in my medical decision making (see chart for details).  Patient presents for evaluation of a pounding headache over the left side for about 3 weeks. This is associated with mild photophobia, and gathering history the patient reports he's had headaches like this off and on at least 3-4 times every 6 months for over the last several years. He is very well-appearing, is in no distress but does demonstrate mild photophobia. He is otherwise completely neurologically intact. He has no meningismus or signs of meningitis, infection, etc.  I suspect the patient may be having cluster type headaches, or potentially status migrainous. I will send an ESR to screen for giant cell the patient is somewhat out of the typical demographic.  ----------------------------------------- 11:26 AM on 06/18/2015 -----------------------------------------  Patient has developed some mild feeling like he needs to walk about. On exam he has  minimal akathisia noted, and I suspect this is a side effect of the Reglan. He does report his headache is improved. His CT is negative, he is ambulatory in the room and feeling that his headache is much better. Appears likely status migrainous, but does not carry an obvious history of migraines but based on his clinical history for which she has never followed up with a neurologist as suspect likely suffering from chronic migraines. ESR is less than 30, making giant cell arteritis extremely unlikely.  We'll give the patient IV Benadryl and Toradol now. Plan to follow and  reassess.   ----------------------------------------- 12:39 PM on 06/18/2015 -----------------------------------------  Patient reports that his headache has almost completely resolved. He feels well, and he no longer feeling shaky or anxious. He is awake alert no distress. Discuss careful return precautions as well as follow-up with both neurology and establishing a primary care.  Return precautions and treatment recommendations and follow-up discussed with the patient who is agreeable with the plan. Patient not driving home today.  ____________________________________________   FINAL CLINICAL IMPRESSION(S) / ED DIAGNOSES  Final diagnoses:  Migraine with status migrainosus, not intractable, unspecified migraine type      Delman Kitten, MD 06/18/15 1240

## 2016-11-13 ENCOUNTER — Encounter: Payer: Self-pay | Admitting: *Deleted

## 2016-11-13 ENCOUNTER — Emergency Department
Admission: EM | Admit: 2016-11-13 | Discharge: 2016-11-13 | Disposition: A | Discharge status: Home / Self Care | Payer: Self-pay | Attending: Student in an Organized Health Care Education/Training Program | Admitting: Student in an Organized Health Care Education/Training Program

## 2016-11-13 DIAGNOSIS — R51 Headache: Secondary | ICD-10-CM | POA: Insufficient documentation

## 2016-11-13 DIAGNOSIS — R110 Nausea: Secondary | ICD-10-CM | POA: Insufficient documentation

## 2016-11-13 DIAGNOSIS — F17210 Nicotine dependence, cigarettes, uncomplicated: Secondary | ICD-10-CM | POA: Insufficient documentation

## 2016-11-13 DIAGNOSIS — R519 Headache, unspecified: Secondary | ICD-10-CM

## 2016-11-13 DIAGNOSIS — Z79899 Other long term (current) drug therapy: Secondary | ICD-10-CM | POA: Insufficient documentation

## 2016-11-13 LAB — BASIC METABOLIC PANEL
ANION GAP: 9 (ref 5–15)
BUN: 10 mg/dL (ref 6–20)
CALCIUM: 9.4 mg/dL (ref 8.9–10.3)
CHLORIDE: 106 mmol/L (ref 101–111)
CO2: 23 mmol/L (ref 22–32)
Creatinine, Ser: 1.17 mg/dL (ref 0.61–1.24)
GFR calc Af Amer: >60 mL/min (ref >60)
GFR calc non Af Amer: >60 mL/min (ref >60)
GLUCOSE: 117 mg/dL — AB (ref 65–99)
POTASSIUM: 3.9 mmol/L (ref 3.5–5.1)
Sodium: 138 mmol/L (ref 135–145)

## 2016-11-13 MED ORDER — DIPHENHYDRAMINE HCL 50 MG/ML IJ SOLN
12.5000 mg | Freq: Once | INTRAMUSCULAR | Status: AC
  Administered 2016-11-13: 12.5 mg via INTRAVENOUS
  Filled 2016-11-13: qty 1

## 2016-11-13 MED ORDER — PROCHLORPERAZINE EDISYLATE 5 MG/ML IJ SOLN
10.0000 mg | Freq: Once | INTRAMUSCULAR | Status: AC
  Administered 2016-11-13: 10 mg via INTRAVENOUS
  Filled 2016-11-13: qty 2

## 2016-11-13 MED ORDER — ACETAMINOPHEN 500 MG PO TABS
1000.0000 mg | ORAL_TABLET | Freq: Once | ORAL | Status: AC
  Administered 2016-11-13: 1000 mg via ORAL
  Filled 2016-11-13: qty 2

## 2016-11-13 MED ORDER — SODIUM CHLORIDE 0.9 % IV BOLUS (SEPSIS)
1000.0000 mL | Freq: Once | INTRAVENOUS | Status: AC
  Administered 2016-11-13: 1000 mL via INTRAVENOUS

## 2016-11-13 NOTE — ED Provider Notes (Signed)
[facility] Emergency Department Provider Note    None    (approximate)  I have reviewed the triage vital signs and the nursing notes.   HISTORY  Chief Complaint Migraine    HPI Edward Owens is a 41 y.o. male with a history of migraine headaches presents with chief complaint of headache consistent with his previous migraines for the past 3 days associated with nausea as well as sensitivity to light and sound.  States he is also been having some intermittent back cramps.  States whenever he has a headache he does develop twitching in the right eye.  Denies any fevers.  No cough.  No chest pain.  No painPain radiating through to his back or shoulder  Past Medical History:  Diagnosis Date  . Migraine    History reviewed. No pertinent family history. Past Surgical History:  Procedure Laterality Date  . HERNIA REPAIR    . LYMPH NODE DISSECTION Left    groin   Patient Active Problem List   Diagnosis Date Noted  . Migraine       Prior to Admission medications   Medication Sig Start Date End Date Taking? Authorizing Provider  hydrOXYzine (ATARAX/VISTARIL) 50 MG tablet Take 1 tablet (50 mg total) by mouth 3 (three) times daily as needed. Patient not taking: Reported on 11/13/2016 06/05/14   Joni ReiningSmith, Ronald K, PA-C    Allergies Patient has no known allergies.    Social History Social History   Tobacco Use  . Smoking status: Current Every Day Smoker    Packs/day: 0.50    Years: 15.00    Pack years: 7.50    Types: Cigarettes  Substance Use Topics  . Alcohol use: No  . Drug use: No    Review of Systems Patient denies headaches, rhinorrhea, blurry vision, numbness, shortness of breath, chest pain, edema, cough, abdominal pain, nausea, vomiting, diarrhea, dysuria, fevers, rashes or hallucinations unless otherwise stated above in HPI. ____________________________________________   PHYSICAL EXAM:  VITAL SIGNS: Vitals:   11/13/16 0714    BP: (!) 159/94  Pulse: 95  Resp: 16  Temp: 98.4 F (36.9 C)  SpO2: 98%    Constitutional: Alert and oriented. Well appearing and in no acute distress. Eyes: Conjunctivae are normal.  Head: Atraumatic. Nose: No congestion/rhinnorhea. Mouth/Throat: Mucous membranes are moist.   Neck: No stridor. Painless ROM.  Cardiovascular: Normal rate, regular rhythm. Grossly normal heart sounds.  Good peripheral circulation. Respiratory: Normal respiratory effort.  No retractions. Lungs CTAB. Gastrointestinal: Soft and nontender. No distention. No abdominal bruits. No CVA tenderness. Genitourinary:  Musculoskeletal: No lower extremity tenderness nor edema.  No joint effusions. Neurologic:  Normal speech and language. No gross focal neurologic deficits are appreciated. No facial droop Skin:  Skin is warm, dry and intact. No rash noted. Psychiatric: Mood and affect are normal. Speech and behavior are normal.  ____________________________________________   LABS (all labs ordered are listed, but only abnormal results are displayed)  Results for orders placed or performed during the hospital encounter of 11/13/16 (from the past 24 hour(s))  Basic metabolic panel     Status: Abnormal   Collection Time: 11/13/16  8:15 AM  Result Value Ref Range   Sodium 138 135 - 145 mmol/L   Potassium 3.9 3.5 - 5.1 mmol/L   Chloride 106 101 - 111 mmol/L   CO2 23 22 - 32 mmol/L   Glucose, Bld 117 (H) 65 - 99 mg/dL   BUN 10 6 - 20 mg/dL  Creatinine, Ser 1.17 0.61 - 1.24 mg/dL   Calcium 9.4 8.9 - 40.910.3 mg/dL   GFR calc non Af Amer >60 >60 mL/min   GFR calc Af Amer >60 >60 mL/min   Anion gap 9 5 - 15   ____________________________________________ ____________________________________________   PROCEDURES  Procedure(s) performed:  Procedures    Critical Care performed: no ____________________________________________   INITIAL IMPRESSION / ASSESSMENT AND PLAN / ED COURSE  Pertinent labs & imaging  results that were available during my care of the patient were reviewed by me and considered in my medical decision making (see chart for details).  DDX: migraine, tension, cluster, less likely infectious, meningitis, encephalitis, iph  Edward Nievesobe O Sholtz is a 41 y.o. who presents to the ED with with Hx of migraines p/w HA for last 3 days. Not worst HA ever. Gradual onset. HA similar to previous episodes. Denies focal neurologic symptoms. Denies trauma. No fevers or neck pain. No vision loss. Afebrile in ED. VSS. Exam as above. No meningeal signs. No CN, motor, sensory or cerebellar deficits. Temporal arteries palpable and non-tender. Appears well and non-toxic.  Will provide IV fluids for hydration and IV medications for symptom control. ikely tension, non-specific or migraine HA. Clinical picture is not consistent with ICH, SAH, SDH, EDH, TIA, or CVA. No concern for meningitis or encephalitis. No concern for GCA/Temporal arteritis.  ----------------------------------------- 10:58 AM on 11/13/2016 -----------------------------------------    Pain improved. Repeat neuro exam is again without focal deficit, nuchal rigidity or evidence of meningeal irritation.  Stable to D/C home, follow up with PCP or Neurology if persistent recurrent Has.  Have discussed with the patient and available family all diagnostics and treatments performed thus far and all questions were answered to the best of my ability. The patient demonstrates understanding and agreement with plan.         ____________________________________________   FINAL CLINICAL IMPRESSION(S) / ED DIAGNOSES  Final diagnoses:  Bad headache      NEW MEDICATIONS STARTED DURING THIS VISIT:  This SmartLink is deprecated. Use AVSMEDLIST instead to display the medication list for a patient.   Note:  This document was prepared using Dragon voice recognition software and may include unintentional dictation errors.    Willy Eddyobinson, Franchot Pollitt,  MD 11/13/16 1058

## 2016-11-13 NOTE — Discharge Instructions (Signed)

## 2016-11-13 NOTE — ED Triage Notes (Signed)
Pt arrives with complaints of a migraine and back/neck pain since Friday, hx of migraines, right sided, right eye twitching which pt states is his normal with a migraine, awake and alert in no acute distress

## 2016-11-13 NOTE — ED Notes (Signed)
Pt states he wants his IV out, ready to go home. MD made aware

## 2016-11-13 NOTE — ED Notes (Signed)
Pt hx of migraines, states headache x3days with nausea and sensitivity to light/sound.

## 2017-02-22 ENCOUNTER — Emergency Department: Payer: Self-pay

## 2017-02-22 ENCOUNTER — Encounter: Payer: Self-pay | Admitting: Emergency Medicine

## 2017-02-22 ENCOUNTER — Other Ambulatory Visit: Payer: Self-pay

## 2017-02-22 ENCOUNTER — Emergency Department
Admission: EM | Admit: 2017-02-22 | Discharge: 2017-02-22 | Disposition: A | Discharge status: Home / Self Care | Payer: Self-pay | Attending: Emergency Medicine | Admitting: Emergency Medicine

## 2017-02-22 DIAGNOSIS — J111 Influenza due to unidentified influenza virus with other respiratory manifestations: Secondary | ICD-10-CM | POA: Insufficient documentation

## 2017-02-22 DIAGNOSIS — R112 Nausea with vomiting, unspecified: Secondary | ICD-10-CM | POA: Insufficient documentation

## 2017-02-22 DIAGNOSIS — F17210 Nicotine dependence, cigarettes, uncomplicated: Secondary | ICD-10-CM | POA: Insufficient documentation

## 2017-02-22 DIAGNOSIS — J101 Influenza due to other identified influenza virus with other respiratory manifestations: Secondary | ICD-10-CM

## 2017-02-22 LAB — INFLUENZA PANEL BY PCR (TYPE A & B)
INFLBPCR: NEGATIVE
Influenza A By PCR: POSITIVE — AB

## 2017-02-22 IMAGING — CR DG CHEST 2V
2 series · 2 of 2 positions shown · non-contrast
Comparison: [DATE] chest radiographs.

CLINICAL DATA: 41-year-old male with fever cough and congestion for
1 day.

EXAM:
CHEST  2 VIEW

[chest pa]
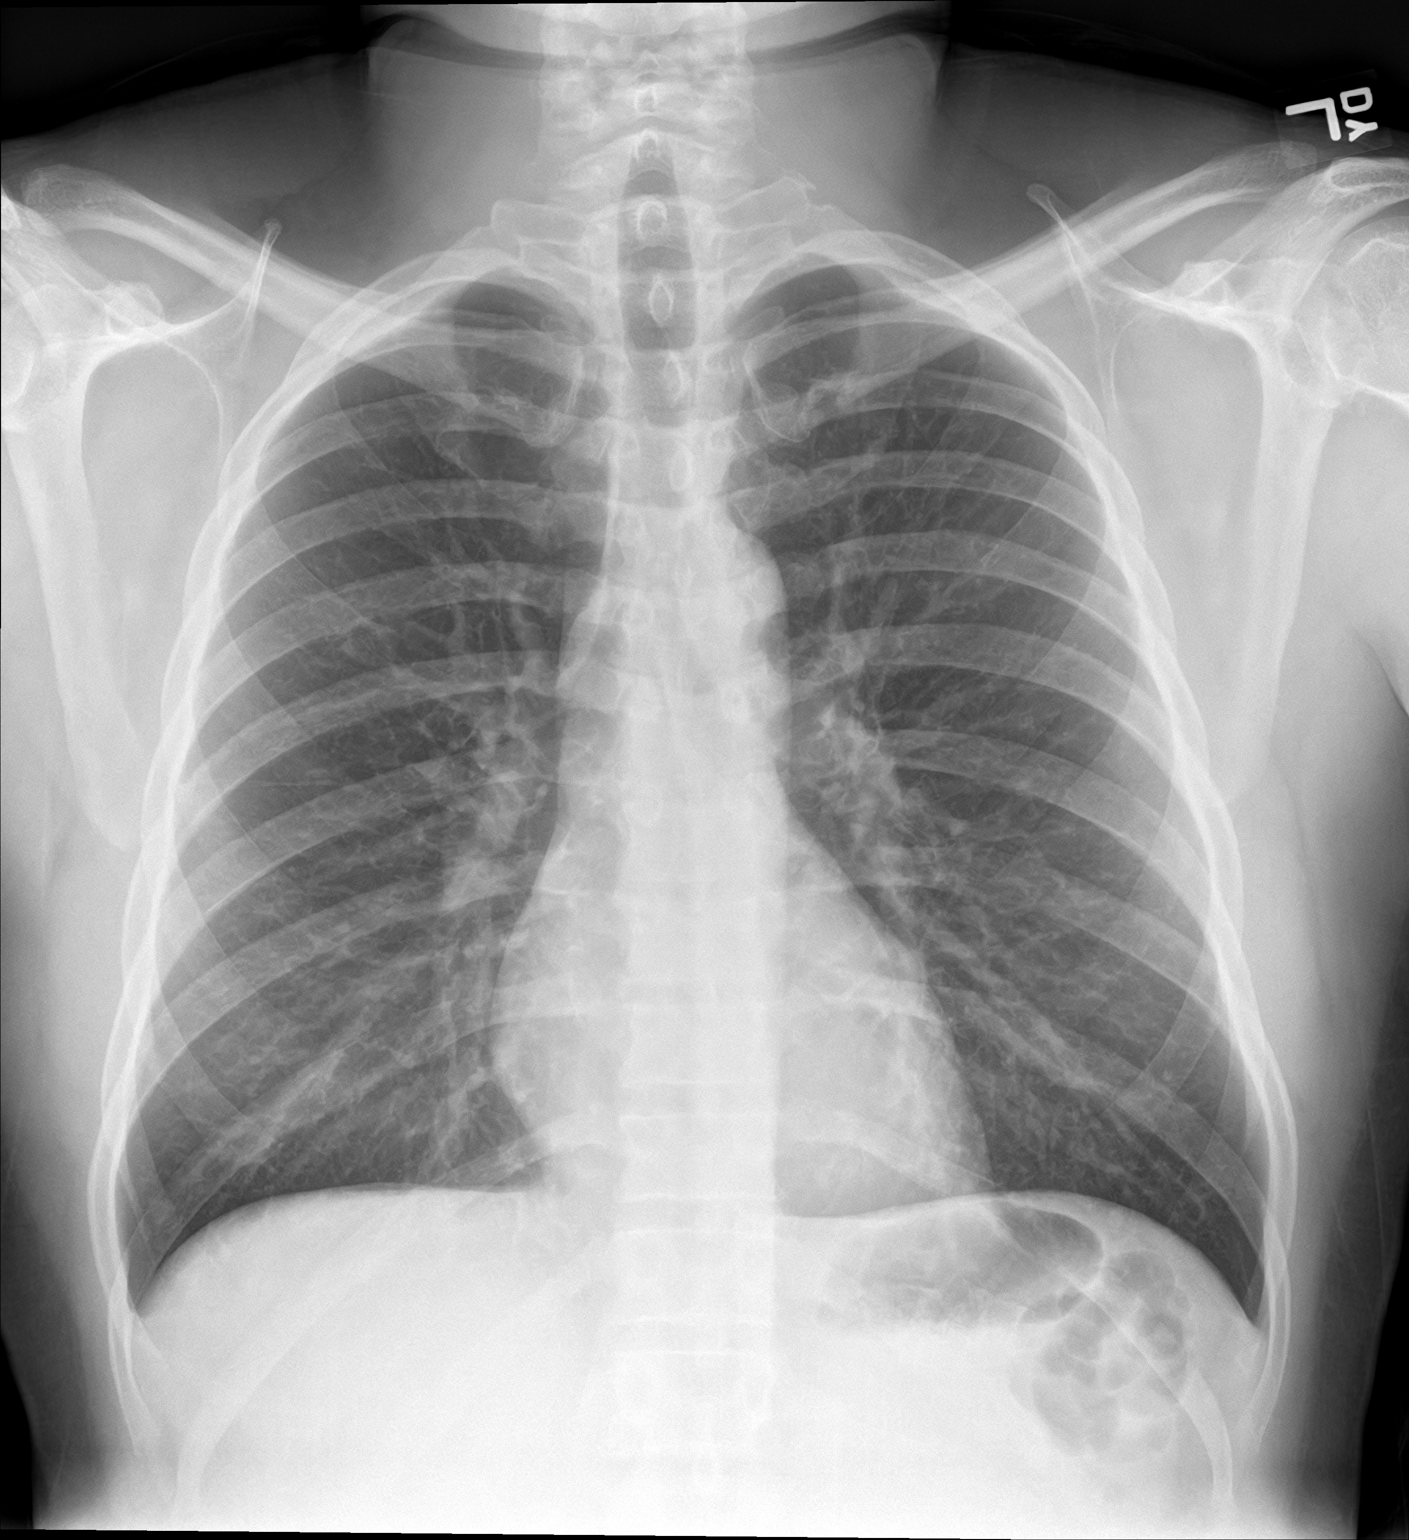

[chest lat]
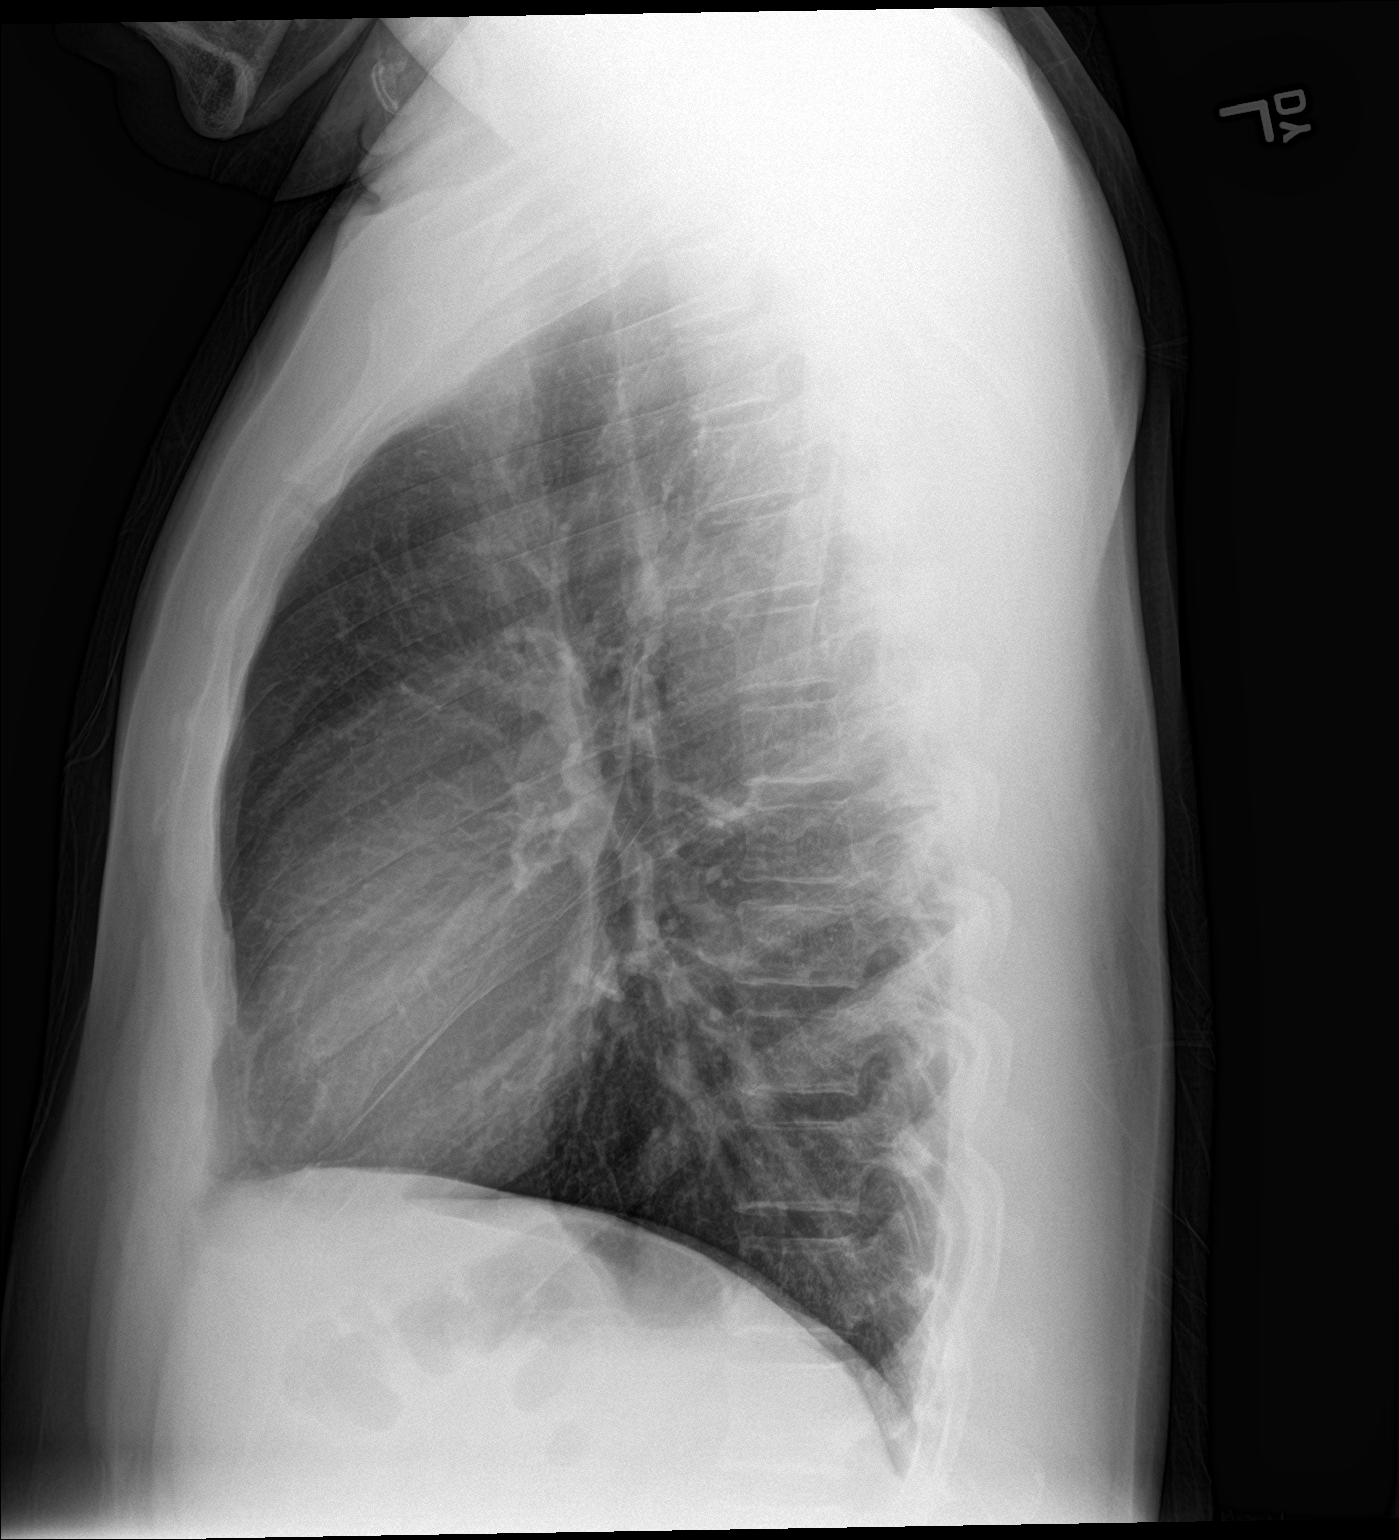

[2 of 2 positions shown; findings below may reference images not displayed]

FINDINGS: Lung volumes remain normal. Mediastinal contours remain normal.
Visualized tracheal air column is within normal limits. The lungs
are stable and clear. No pneumothorax or pleural effusion. No
osseous abnormality identified. Negative visible bowel gas pattern.
IMPRESSION: Negative.  No acute cardiopulmonary abnormality.

## 2017-02-22 MED ORDER — ONDANSETRON 4 MG PO TBDP
4.0000 mg | ORAL_TABLET | Freq: Once | ORAL | Status: AC
  Administered 2017-02-22: 4 mg via ORAL
  Filled 2017-02-22: qty 1

## 2017-02-22 MED ORDER — OSELTAMIVIR PHOSPHATE 75 MG PO CAPS: 75.0000 mg | ORAL_CAPSULE | Freq: Two times a day (BID) | ORAL | 0 refills | Status: DC

## 2017-02-22 MED ORDER — ACETAMINOPHEN 500 MG PO TABS
1000.0000 mg | ORAL_TABLET | Freq: Once | ORAL | Status: AC
  Administered 2017-02-22: 1000 mg via ORAL
  Filled 2017-02-22: qty 2

## 2017-02-22 MED ORDER — HYDROCODONE-HOMATROPINE 5-1.5 MG/5ML PO SYRP: 5.0000 mL | ORAL_SOLUTION | Freq: Four times a day (QID) | ORAL | 0 refills | Status: DC | PRN

## 2017-02-22 NOTE — Discharge Instructions (Signed)
Follow up with your regular doctor or acute care if not better in 3 days.  Take the medication as prescribed.  Tamiflu will only help with your symptoms it will not cure the flu.  Only time will cure the flu.  Use the cough medication as needed for cough.  Drink plenty of fluids.  If you are worsening please return the emergency department

## 2017-02-22 NOTE — ED Triage Notes (Signed)
C/O fever, cough, sinus congestion x 1 day.  AAOx3.  Skin warm and dry. NAD

## 2017-02-22 NOTE — ED Notes (Signed)
Pt c/o fever, cough, body aches, and congestion. A&O x4

## 2017-02-22 NOTE — ED Provider Notes (Signed)
[facility] Emergency Department Provider Note  ____________________________________________   First MD Initiated Contact with Patient 02/22/17 1248     (approximate)  I have reviewed the triage vital signs and the nursing notes.   HISTORY  Chief Complaint Fever and Cough    HPI Edward Owens is a 42 y.o. male reports emergency department complaining of fever, chills, body aches and wet cough.  He has had some nausea and vomiting.  He has had no diarrhea.  His family member states he is not been able to keep any fluids down.  Symptoms started yesterday.  He has been otherwise healthy  Past Medical History:  Diagnosis Date  . Migraine     Patient Active Problem List   Diagnosis Date Noted  . Migraine     Past Surgical History:  Procedure Laterality Date  . HERNIA REPAIR    . LYMPH NODE DISSECTION Left    groin    Prior to Admission medications   Medication Sig Start Date End Date Taking? Authorizing Provider  HYDROcodone-homatropine (HYCODAN) 5-1.5 MG/5ML syrup Take 5 mLs by mouth every 6 (six) hours as needed for cough. 02/22/17   Sherrie MustacheFisher, Roselyn BeringSusan W, PA-C  hydrOXYzine (ATARAX/VISTARIL) 50 MG tablet Take 1 tablet (50 mg total) by mouth 3 (three) times daily as needed. Patient not taking: Reported on 11/13/2016 06/05/14   Joni ReiningSmith, Ronald K, PA-C  oseltamivir (TAMIFLU) 75 MG capsule Take 1 capsule (75 mg total) by mouth 2 (two) times daily. 02/22/17   Faythe GheeFisher, Susan W, PA-C    Allergies Patient has no known allergies.  No family history on file.  Social History Social History   Tobacco Use  . Smoking status: Current Every Day Smoker    Packs/day: 0.50    Years: 15.00    Pack years: 7.50    Types: Cigarettes  . Smokeless tobacco: Never Used  Substance Use Topics  . Alcohol use: No  . Drug use: No    Review of Systems  Constitutional: Positive fever/chills\body aches Eyes: No visual changes. ENT: No sore throat. Respiratory: Positive  cough Gastrointestinal: Positive for nausea and vomiting, no diarrhea Genitourinary: Negative for dysuria. Musculoskeletal: Negative for back pain. Skin: Negative for rash.    ____________________________________________   PHYSICAL EXAM:  VITAL SIGNS: ED Triage Vitals  Enc Vitals Group     BP 02/22/17 1258 131/67     Pulse Rate 02/22/17 1258 91     Resp --      Temp 02/22/17 1258 (!) 101 F (38.3 C)     Temp Source 02/22/17 1258 Oral     SpO2 02/22/17 1258 99 %     Weight 02/22/17 1203 200 lb (90.7 kg)     Height 02/22/17 1203 5\' 10"  (1.778 m)     Head Circumference --      Peak Flow --      Pain Score 02/22/17 1202 6     Pain Loc --      Pain Edu? --      Excl. in GC? --     Constitutional: Alert and oriented. Well appearing and in no acute distress. Eyes: Conjunctivae are normal.  Head: Atraumatic. Nose: No congestion/rhinnorhea. Mouth/Throat: Mucous membranes are moist.  Throat is normal Cardiovascular: Normal rate, regular rhythm.  Heart sounds are normal Respiratory: Normal respiratory effort.  No retractions, lungs are clear to auscultation, the cough is harsh and wet Abdomen: Soft, nontender, bowel sounds normal GU: deferred Musculoskeletal: FROM all extremities, warm  and well perfused Neurologic:  Normal speech and language.  Skin:  Skin is warm, dry and intact. No rash noted. Psychiatric: Mood and affect are normal. Speech and behavior are normal.  ____________________________________________   LABS (all labs ordered are listed, but only abnormal results are displayed)  Labs Reviewed  INFLUENZA PANEL BY PCR (TYPE A & B) - Abnormal; Notable for the following components:      Result Value   Influenza A By PCR POSITIVE (*)    All other components within normal limits   ____________________________________________   ____________________________________________  RADIOLOGY  Chest x-ray is negative for  pneumonia  ____________________________________________   PROCEDURES  Procedure(s) performed: No  Procedures    ____________________________________________   INITIAL IMPRESSION / ASSESSMENT AND PLAN / ED COURSE  Pertinent labs & imaging results that were available during my care of the patient were reviewed by me and considered in my medical decision making (see chart for details).  Patient is a 42 year old male with presenting to the emergency department with fever, cough, congestion, nausea and vomiting for 1 day.  On physical exam patient is febrile.  His cough is wet and harsh.  However his lungs are clear to auscultation  Flu test, chest x-ray, Zofran 4 mg ODT, Tylenol 1 g p.o.    ----------------------------------------- 3:40 PM on 02/22/2017 -----------------------------------------  Flu test is positive for influenza A.  Chest x-ray is negative for pneumonia.  Patient states he feels a little better due to the nausea medication.  However he states he feels really hot and sweaty.  Discussed his test results with him.  He is to drink fluids, take ibuprofen or Tylenol for fever, and eat a brat diet.  Explained Tamiflu and its side effects.  Explained to him that Tamiflu does not cure the flu and only helps with his symptoms.  Patient states he does not have the money to buy that so he will use over-the-counter medicines.  He was given a prescription for a cough medication.  He is to return to emergency department if he is worsening.  Patient states he understands will comply.  Was discharged in stable condition.  He was given a work note due to fever and positive influenza test  As part of my medical decision making, I reviewed the following data within the electronic MEDICAL RECORD NUMBER Nursing notes reviewed and incorporated, Labs reviewed positive for influenza A, Radiograph reviewed cxr negative for pneumonia, Notes from prior ED visits and [place] Controlled Substance  Database  ____________________________________________   FINAL CLINICAL IMPRESSION(S) / ED DIAGNOSES  Final diagnoses:  Influenza A      NEW MEDICATIONS STARTED DURING THIS VISIT:  Discharge Medication List as of 02/22/2017  2:20 PM    START taking these medications   Details  HYDROcodone-homatropine (HYCODAN) 5-1.5 MG/5ML syrup Take 5 mLs by mouth every 6 (six) hours as needed for cough., Starting Fri 02/22/2017, Print    oseltamivir (TAMIFLU) 75 MG capsule Take 1 capsule (75 mg total) by mouth 2 (two) times daily., Starting Fri 02/22/2017, Print         Note:  This document was prepared using Dragon voice recognition software and may include unintentional dictation errors.    Faythe Ghee, PA-C 02/22/17 1542    Dionne Bucy, MD 02/22/17 [phone]

## 2017-04-12 ENCOUNTER — Other Ambulatory Visit: Payer: Self-pay

## 2017-04-12 ENCOUNTER — Encounter: Payer: Self-pay | Admitting: Emergency Medicine

## 2017-04-12 ENCOUNTER — Emergency Department
Admission: EM | Admit: 2017-04-12 | Discharge: 2017-04-12 | Disposition: A | Discharge status: Home / Self Care | Payer: Self-pay | Attending: Emergency Medicine | Admitting: Emergency Medicine

## 2017-04-12 DIAGNOSIS — H1045 Other chronic allergic conjunctivitis: Secondary | ICD-10-CM | POA: Insufficient documentation

## 2017-04-12 DIAGNOSIS — F17210 Nicotine dependence, cigarettes, uncomplicated: Secondary | ICD-10-CM | POA: Insufficient documentation

## 2017-04-12 DIAGNOSIS — H1011 Acute atopic conjunctivitis, right eye: Secondary | ICD-10-CM

## 2017-04-12 MED ORDER — OLOPATADINE HCL 0.1 % OP SOLN: 1.0000 [drp] | Freq: Two times a day (BID) | OPHTHALMIC | 1 refills | Status: DC

## 2017-04-12 NOTE — ED Provider Notes (Signed)
[facility] Emergency Department Provider Note ____________________________________________  Time seen: Approximately 9:31 AM  I have reviewed the triage vital signs and the nursing notes.   HISTORY  Chief Complaint Stye   HPI Edward Owens is a 42 y.o. male who presents to the emergency department for treatment and evaluation of right eye drainage and itching. Symptoms started yesterday. No alleviating measures attempted PTA.  Past Medical History:  Diagnosis Date  . Migraine     Patient Active Problem List   Diagnosis Date Noted  . Migraine     Past Surgical History:  Procedure Laterality Date  . HERNIA REPAIR    . LYMPH NODE DISSECTION Left    groin    Prior to Admission medications   Medication Sig Start Date End Date Taking? Authorizing Provider  HYDROcodone-homatropine (HYCODAN) 5-1.5 MG/5ML syrup Take 5 mLs by mouth every 6 (six) hours as needed for cough. 02/22/17   Sherrie MustacheFisher, Roselyn BeringSusan W, PA-C  hydrOXYzine (ATARAX/VISTARIL) 50 MG tablet Take 1 tablet (50 mg total) by mouth 3 (three) times daily as needed. Patient not taking: Reported on 11/13/2016 06/05/14   Joni ReiningSmith, Ronald K, PA-C  olopatadine (PATANOL) 0.1 % ophthalmic solution Place 1 drop into both eyes 2 (two) times daily. 04/12/17 04/12/18  Caliah Kopke, Rulon Eisenmengerari B, FNP  oseltamivir (TAMIFLU) 75 MG capsule Take 1 capsule (75 mg total) by mouth 2 (two) times daily. 02/22/17   Faythe GheeFisher, Susan W, PA-C    Allergies Patient has no known allergies.  No family history on file.  Social History Social History   Tobacco Use  . Smoking status: Current Every Day Smoker    Packs/day: 0.50    Years: 15.00    Pack years: 7.50    Types: Cigarettes  . Smokeless tobacco: Never Used  Substance Use Topics  . Alcohol use: No  . Drug use: No    Review of Systems   Constitutional: No fever/chills Eyes: Negataive for visual changes. Negative for pain. Positive for itching. Musculoskeletal: Negative for  pain. Skin: Negative for rash. Neurological: Negative for headaches, focal weakness or numbness. Allergic: Positive for seasonal allergies. ____________________________________________  PHYSICAL EXAM:  VITAL SIGNS: ED Triage Vitals  Enc Vitals Group     BP 04/12/17 0926 (!) 144/91     Pulse Rate 04/12/17 0926 77     Resp 04/12/17 0926 18     Temp 04/12/17 0926 98.3 F (36.8 C)     Temp Source 04/12/17 0926 Oral     SpO2 04/12/17 0926 99 %     Weight 04/12/17 0857 205 lb (93 kg)     Height 04/12/17 0857 5\' 9"  (1.753 m)     Head Circumference --      Peak Flow --      Pain Score 04/12/17 0856 0     Pain Loc --      Pain Edu? --      Excl. in GC? --     Constitutional: Alert and oriented. Well appearing and in no acute distress. Eyes: Visual acuity--see nursing documentation; no globe trauma; Eyelids normal to inspection; Sclera appears anicteric.  Eyelids were inverted. Conjunctiva appears clear and injected; Cornea clear.  No lesion/stye noted. Head: Atraumatic. Nose: No congestion/rhinnorhea. Mouth/Throat: Mucous membranes are moist.  Oropharynx non-erythematous. Respiratory: Respirations even and unlabored. Musculoskeletal:Normal ROM x 4 extremities. Neurologic:  Normal speech and language. No gross focal neurologic deficits are appreciated. Speech is normal. No gait instability. Skin:  Skin is warm, dry and intact.  No rash noted. Psychiatric: Mood and affect are normal. Speech and behavior are normal.  ____________________________________________   LABS (all labs ordered are listed, but only abnormal results are displayed)  Labs Reviewed - No data to display ____________________________________________  EKG  Not indicated. ____________________________________________  RADIOLOGY  Not indicated. ____________________________________________   PROCEDURES  Procedure(s) performed: None ____________________________________________   INITIAL IMPRESSION /  ASSESSMENT AND PLAN / ED COURSE  42 year old male presenting to the emergency department for treatment and evaluation of right itching and drainage.  Symptoms and exam most consistent with allergic conjunctivitis.  He will be treated with Pataday.  He was then encouraged to follow-up with ophthalmology for symptoms that are not improving over a week.  He was instructed to return to the emergency department for symptoms that change or worsen if he is unable to schedule appointment.  Pertinent labs & imaging results that were available during my care of the patient were reviewed by me and considered in my medical decision making (see chart for details). ____________________________________________   FINAL CLINICAL IMPRESSION(S) / ED DIAGNOSES  Final diagnoses:  Allergic conjunctivitis of right eye    Note:  This document was prepared using Dragon voice recognition software and may include unintentional dictation errors.    Chinita Pester, FNP 04/12/17 1610    Nita Sickle, MD 04/12/17 1319

## 2017-04-12 NOTE — ED Triage Notes (Signed)
Pt here with c/o waking up with painful bump on corner of right  eye.

## 2017-07-26 ENCOUNTER — Emergency Department
Admission: EM | Admit: 2017-07-26 | Discharge: 2017-07-26 | Disposition: A | Discharge status: Home / Self Care | Payer: Self-pay | Attending: Emergency Medicine | Admitting: Emergency Medicine

## 2017-07-26 ENCOUNTER — Other Ambulatory Visit: Payer: Self-pay

## 2017-07-26 ENCOUNTER — Encounter: Payer: Self-pay | Admitting: Emergency Medicine

## 2017-07-26 DIAGNOSIS — F17210 Nicotine dependence, cigarettes, uncomplicated: Secondary | ICD-10-CM | POA: Insufficient documentation

## 2017-07-26 DIAGNOSIS — G43901 Migraine, unspecified, not intractable, with status migrainosus: Secondary | ICD-10-CM | POA: Insufficient documentation

## 2017-07-26 LAB — GLUCOSE, CAPILLARY: Glucose-Capillary: 122 mg/dL — ABNORMAL HIGH (ref 70–99)

## 2017-07-26 MED ORDER — SODIUM CHLORIDE 0.9 % IV BOLUS: 1000.0000 mL | Freq: Once | INTRAVENOUS | Status: DC

## 2017-07-26 MED ORDER — KETOROLAC TROMETHAMINE 30 MG/ML IJ SOLN: 30.0000 mg | Freq: Once | INTRAMUSCULAR | Status: DC

## 2017-07-26 MED ORDER — HYDROCODONE-ACETAMINOPHEN 5-325 MG PO TABS
1.0000 | ORAL_TABLET | Freq: Once | ORAL | Status: AC
  Administered 2017-07-26: 1 via ORAL
  Filled 2017-07-26: qty 1

## 2017-07-26 MED ORDER — DIPHENHYDRAMINE HCL 50 MG/ML IJ SOLN
25.0000 mg | Freq: Once | INTRAMUSCULAR | Status: DC
  Filled 2017-07-26: qty 1

## 2017-07-26 MED ORDER — KETOROLAC TROMETHAMINE 30 MG/ML IJ SOLN
30.0000 mg | Freq: Once | INTRAMUSCULAR | Status: DC
  Filled 2017-07-26: qty 1

## 2017-07-26 MED ORDER — DIPHENHYDRAMINE HCL 25 MG PO CAPS
50.0000 mg | ORAL_CAPSULE | Freq: Once | ORAL | Status: AC
  Administered 2017-07-26: 50 mg via ORAL
  Filled 2017-07-26: qty 2

## 2017-07-26 MED ORDER — METOCLOPRAMIDE HCL 5 MG/ML IJ SOLN
10.0000 mg | Freq: Once | INTRAMUSCULAR | Status: DC
  Filled 2017-07-26: qty 2

## 2017-07-26 NOTE — ED Provider Notes (Signed)
[facility] Emergency Department Provider Note  ____________________________________________   First MD Initiated Contact with Patient 07/26/17 [phone]     (approximate)  I have reviewed the triage vital signs and the nursing notes.   HISTORY  Chief Complaint Migraine   HPI Edward Owens is a 42 y.o. male here with complaint of right sided migraine headache that started 5 days ago.  Patient has a history of migraines and states that this is his same headache that he has.  He endorses photophobia and sensitivity to noise.  He has been taking over-the-counter Excedrin Migraine without any relief.  Patient denies any vomiting but has had nausea at times.  Patient states he has had 2 CTs in the past that both were reported as negative.  Patient has continued to work until last evening when he just "could not make it".  Rates his pain as 7 out of 10.   Past Medical History:  Diagnosis Date  . Migraine     Patient Active Problem List   Diagnosis Date Noted  . Migraine     Past Surgical History:  Procedure Laterality Date  . HERNIA REPAIR    . LYMPH NODE DISSECTION Left    groin    Prior to Admission medications   Not on File    Allergies Patient has no known allergies.  No family history on file.  Social History Social History   Tobacco Use  . Smoking status: Current Every Day Smoker    Packs/day: 0.50    Years: 15.00    Pack years: 7.50    Types: Cigarettes  . Smokeless tobacco: Never Used  Substance Use Topics  . Alcohol use: No  . Drug use: No    Review of Systems Constitutional: No fever/chills Eyes: No visual changes.  Positive for photophobia. ENT: No sore throat. Cardiovascular: Denies chest pain. Respiratory: Denies shortness of breath. Gastrointestinal: No abdominal pain.  Positive nausea, no vomiting.  No diarrhea.  No constipation. Musculoskeletal: Negative for back pain. Skin: Negative for rash. Neurological:  Positive for migraine headaches, negative for focal weakness or numbness. ____________________________________________   PHYSICAL EXAM:  VITAL SIGNS: ED Triage Vitals  Enc Vitals Group     BP 07/26/17 0933 130/77     Pulse Rate 07/26/17 0933 73     Resp 07/26/17 0933 18     Temp 07/26/17 0933 98.2 F (36.8 C)     Temp Source 07/26/17 0933 Oral     SpO2 07/26/17 0933 97 %     Weight 07/26/17 0930 187 lb (84.8 kg)     Height 07/26/17 0930 5\' 9"  (1.753 m)     Head Circumference --      Peak Flow --      Pain Score 07/26/17 0930 7     Pain Loc --      Pain Edu? --      Excl. in GC? --    Constitutional: Alert and oriented. Well appearing and in no acute distress. Eyes: Conjunctivae are normal. PERRL. EOMI. Head: Atraumatic. Nose: No congestion/rhinnorhea. Mouth/Throat: Mucous membranes are moist.  Oropharynx non-erythematous. Neck: No stridor.   Hematological/Lymphatic/Immunilogical: No cervical lymphadenopathy. Cardiovascular: Normal rate, regular rhythm. Grossly normal heart sounds.  Good peripheral circulation. Respiratory: Normal respiratory effort.  No retractions. Lungs CTAB. Gastrointestinal: Soft and nontender. No distention. Musculoskeletal: Moves upper and lower extremities with any difficulty.  Normal gait was noted.  Good muscle strength bilaterally. Neurologic:  Normal speech and language. No  gross focal neurologic deficits are appreciated.  Cranial nerves II through XII grossly intact.  Reflexes bilateral infra patella is 2+ bilaterally.  No gait instability. Skin:  Skin is warm, dry and intact. No rash noted. Psychiatric: Mood and affect are normal. Speech and behavior are normal.  ____________________________________________   LABS (all labs ordered are listed, but only abnormal results are displayed)  Labs Reviewed  GLUCOSE, CAPILLARY - Abnormal; Notable for the following components:      Result Value   Glucose-Capillary 122 (*)    All other components  within normal limits    PROCEDURES  Procedure(s) performed: None  Procedures  Critical Care performed: No  ____________________________________________   INITIAL IMPRESSION / ASSESSMENT AND PLAN / ED COURSE  As part of my medical decision making, I reviewed the following data within the electronic MEDICAL RECORD NUMBER Notes from prior ED visits and  Controlled Substance Database  Patient refused getting any medication IV and IV fluids.  He states that he will take any number of injections but the last time he had an IV it "hurt a lot".  Patient was given Toradol 30 mg IM along with Benadryl p.o. and one Norco.  Patient is to follow-up with  [facility] if any continued problems.  He will continue his Excedrin migraine at home.  He was also given a list of clinics so that he can establish primary care and be treated outpatient.  ____________________________________________   FINAL CLINICAL IMPRESSION(S) / ED DIAGNOSES  Final diagnoses:  Migraine with status migrainosus, not intractable, unspecified migraine type     ED Discharge Orders    None       Note:  This document was prepared using Dragon voice recognition software and may include unintentional dictation errors.    Tommi Rumps, PA-C 07/26/17 1343    Jene Every, MD 07/26/17 [phone]

## 2017-07-26 NOTE — Discharge Instructions (Signed)
Call make an appointment with 1 the clinics listed on your discharge papers.  The open-door clinic is also listed.  You will need to establish a primary care provider.  Increase fluids today.  Continue Excedrin Migraine as needed.  Return to the emergency department if any severe worsening of your symptoms are not improving.  A note was also written for you to stay out of work today.

## 2017-07-26 NOTE — ED Triage Notes (Signed)
Pt states migraine started about 5 days ago, has taken Excedrin migraine and tylenol with no relief. Appears in NAD. Denies N,V.

## 2018-06-28 ENCOUNTER — Other Ambulatory Visit: Payer: Self-pay

## 2018-06-28 ENCOUNTER — Emergency Department
Admission: EM | Admit: 2018-06-28 | Discharge: 2018-06-28 | Disposition: A | Discharge status: Home / Self Care | Payer: Self-pay | Attending: Emergency Medicine | Admitting: Emergency Medicine

## 2018-06-28 ENCOUNTER — Encounter: Payer: Self-pay | Admitting: Emergency Medicine

## 2018-06-28 DIAGNOSIS — G43009 Migraine without aura, not intractable, without status migrainosus: Secondary | ICD-10-CM | POA: Insufficient documentation

## 2018-06-28 DIAGNOSIS — F17210 Nicotine dependence, cigarettes, uncomplicated: Secondary | ICD-10-CM | POA: Insufficient documentation

## 2018-06-28 MED ORDER — METOCLOPRAMIDE HCL 5 MG/ML IJ SOLN
10.0000 mg | Freq: Once | INTRAMUSCULAR | Status: AC
  Administered 2018-06-28: 10 mg via INTRAVENOUS
  Filled 2018-06-28: qty 2

## 2018-06-28 MED ORDER — SODIUM CHLORIDE 0.9 % IV BOLUS
1000.0000 mL | Freq: Once | INTRAVENOUS | Status: AC
  Administered 2018-06-28: 1000 mL via INTRAVENOUS

## 2018-06-28 MED ORDER — KETOROLAC TROMETHAMINE 30 MG/ML IJ SOLN
30.0000 mg | Freq: Once | INTRAMUSCULAR | Status: AC
  Administered 2018-06-28: 30 mg via INTRAVENOUS
  Filled 2018-06-28: qty 1

## 2018-06-28 MED ORDER — DIPHENHYDRAMINE HCL 50 MG/ML IJ SOLN
25.0000 mg | Freq: Once | INTRAMUSCULAR | Status: AC
  Administered 2018-06-28: 25 mg via INTRAVENOUS
  Filled 2018-06-28: qty 1

## 2018-06-28 NOTE — ED Triage Notes (Signed)
Pt arrived via POV with reports of migraine headache that started on Thursday. Pt states he has hx of migraine and pain is the same. Pt states he took migraine excedrin yesterday before going to work, but did not relieve the pain. Pt also developed right eye twitch which he states is normal with his headaches.  Pt denies any nausea or vomiting at this time, pt does report some light sensitivity in the right eye.

## 2018-06-28 NOTE — ED Provider Notes (Signed)
[facility] Emergency Department Provider Note   ____________________________________________   First MD Initiated Contact with Patient 06/28/18 [phone]     (approximate)  I have reviewed the triage vital signs and the nursing notes.   HISTORY  Chief Complaint Migraine   HPI Edward Owens is a 43 y.o. male presents to the ED with complaint of "migraine headache" that started 2 days ago.  Patient states he has a history of migraines and this pain is the same as his usual.  He states he took migraine Excedrin yesterday without any relief.  He denies any nausea, vomiting or visual changes.  He reports that he does have a "twitch" in the right eye that also is normal with his headaches.  Mild photophobia.  Patient rates his pain as a 9/10.      Past Medical History:  Diagnosis Date  . Migraine     Patient Active Problem List   Diagnosis Date Noted  . Migraine     Past Surgical History:  Procedure Laterality Date  . HERNIA REPAIR    . LYMPH NODE DISSECTION Left    groin    Prior to Admission medications   Not on File    Allergies Patient has no known allergies.  No family history on file.  Social History Social History   Tobacco Use  . Smoking status: Current Every Day Smoker    Packs/day: 0.50    Years: 15.00    Pack years: 7.50    Types: Cigarettes  . Smokeless tobacco: Never Used  Substance Use Topics  . Alcohol use: No  . Drug use: No    Review of Systems Constitutional: No fever/chills Eyes: No visual changes. ENT: No sore throat. Cardiovascular: Denies chest pain. Respiratory: Denies shortness of breath. Gastrointestinal: No abdominal pain.  No nausea, no vomiting.   Musculoskeletal: Negative for back pain. Skin: Negative for rash. Neurological: Positive for headaches, negative for focal weakness or numbness. ____________________________________________   PHYSICAL EXAM:  VITAL SIGNS: ED Triage Vitals  Enc Vitals  Group     BP 06/28/18 0832 126/72     Pulse Rate 06/28/18 0832 85     Resp 06/28/18 0832 18     Temp 06/28/18 0832 98.7 F (37.1 C)     Temp Source 06/28/18 0832 Oral     SpO2 06/28/18 0832 97 %     Weight 06/28/18 0833 205 lb (93 kg)     Height 06/28/18 0833 5\' 8"  (1.727 m)     Head Circumference --      Peak Flow --      Pain Score 06/28/18 0919 9     Pain Loc --      Pain Edu? --      Excl. in [place]? --    Constitutional: Alert and oriented. Well appearing and in no acute distress. Eyes: Conjunctivae are normal. PERRL. EOMI. very mild photophobia. Head: Atraumatic. Nose: No congestion/rhinnorhea. Neck: No stridor.  Nontender cervical spine to palpation posteriorly.  There is some mild bilateral cervical muscle and right trapezius muscle tenderness.  Cervical range of motion in all 4 planes is present. Cardiovascular: Normal rate, regular rhythm. Grossly normal heart sounds.  Good peripheral circulation. Respiratory: Normal respiratory effort.  No retractions. Lungs CTAB. Musculoskeletal: Moves upper and lower extremities without any difficulty.  Normal gait was noted. Neurologic:  Normal speech and language. No gross focal neurologic deficits are appreciated.  Cranial nerves II through XII grossly intact.  No  gait instability. Skin:  Skin is warm, dry and intact. No rash noted. Psychiatric: Mood and affect are normal. Speech and behavior are normal.  ____________________________________________   LABS (all labs ordered are listed, but only abnormal results are displayed)  Labs Reviewed - No data to display  PROCEDURES  Procedure(s) performed (including Critical Care):  Procedures   ____________________________________________   INITIAL IMPRESSION / ASSESSMENT AND PLAN / ED COURSE  As part of my medical decision making, I reviewed the following data within the electronic MEDICAL RECORD NUMBER Notes from prior ED visits and [place] Controlled Substance Database   ----------------------------------------- 10:38 AM on 06/28/2018 ----------------------------------------- Patient states no continued headache.  43 year old male presents to the ED with complaint of migraine headache for the last 2 days.  Patient states that he is taken Excedrin migraine without any relief yesterday.  He states he has a history of migraines and that this is the same as his previous migraines.  Chart review shows that patient had a CT head that was reported as negative in 2017.  Patient was given IV fluids along with Toradol 30 mg, Benadryl 25 mg and Reglan 10 mg IV.  Patient was discharged at the time that he stated he had no continued headaches and felt fine.  ____________________________________________   FINAL CLINICAL IMPRESSION(S) / ED DIAGNOSES  Final diagnoses:  Migraine without aura and without status migrainosus, not intractable     ED Discharge Orders    None       Note:  This document was prepared using Dragon voice recognition software and may include unintentional dictation errors.    Tommi RumpsSummers, Ishaaq Penna L, PA-C 06/28/18 1057    Sharyn CreamerQuale, Mark, MD 06/28/18 [phone]

## 2018-06-28 NOTE — Discharge Instructions (Signed)
Follow-up with your primary care provider or can no clinic acute care if any continued problems.  Also you may qualify for the open-door clinic which is free.  Continue drinking fluids today.

## 2018-10-05 ENCOUNTER — Emergency Department
Admission: EM | Admit: 2018-10-05 | Discharge: 2018-10-05 | Disposition: A | Discharge status: Home / Self Care | Payer: Self-pay | Attending: Emergency Medicine | Admitting: Emergency Medicine

## 2018-10-05 ENCOUNTER — Other Ambulatory Visit: Payer: Self-pay

## 2018-10-05 ENCOUNTER — Encounter: Payer: Self-pay | Admitting: Emergency Medicine

## 2018-10-05 DIAGNOSIS — F17210 Nicotine dependence, cigarettes, uncomplicated: Secondary | ICD-10-CM | POA: Insufficient documentation

## 2018-10-05 DIAGNOSIS — K648 Other hemorrhoids: Secondary | ICD-10-CM | POA: Insufficient documentation

## 2018-10-05 DIAGNOSIS — K625 Hemorrhage of anus and rectum: Secondary | ICD-10-CM | POA: Insufficient documentation

## 2018-10-05 MED ORDER — HYDROCORTISONE ACETATE 25 MG RE SUPP: 25.0000 mg | Freq: Two times a day (BID) | RECTAL | 2 refills | Status: DC

## 2018-10-05 NOTE — ED Triage Notes (Signed)
Pt to ED via POV c/o rectal pain. Pt states that he currently has hemorrhoids and they have became more painful and he is having some bleeding with them. Pt is in NAD.

## 2018-10-05 NOTE — Discharge Instructions (Signed)
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  Use the Anusol HC.  You can continue to soak in a tub of warm water with Epson salts.  Use stool softeners such as MiraLAX.  If you are worsening please return emergency department.  Follow-up with surgery to discuss having the area fixed.

## 2018-10-05 NOTE — ED Provider Notes (Signed)
[facility] Emergency Department Provider Note  ____________________________________________   First MD Initiated Contact with Patient 10/05/18 [phone]     (approximate)  I have reviewed the triage vital signs and the nursing notes.   HISTORY  Chief Complaint Rectal Pain    HPI Edward Owens is a 43 y.o. male presents emergency department complaining of rectal pain and hemorrhoids.  He states that they have been bleeding after bowel movements.  He states at work he was straining to push a heavy pallet and his boss noticed that he had blood all over his pants.  He sent him home.  He has been soaking in a tub of warm water with Epson salts which is helped a small amount.  Using Preparation H without any relief.  He states he does spend a great deal of time sitting on the toilet.  Symptoms have been on and off for 10 years but have worsened over this past week.    Past Medical History:  Diagnosis Date  . Migraine     Patient Active Problem List   Diagnosis Date Noted  . Migraine     Past Surgical History:  Procedure Laterality Date  . HERNIA REPAIR    . LYMPH NODE DISSECTION Left    groin    Prior to Admission medications   Medication Sig Start Date End Date Taking? Authorizing Provider  hydrocortisone (ANUSOL-HC) 25 MG suppository Place 1 suppository (25 mg total) rectally 2 (two) times daily. 10/05/18   Faythe Ghee, PA-C    Allergies Patient has no known allergies.  No family history on file.  Social History Social History   Tobacco Use  . Smoking status: Current Every Day Smoker    Packs/day: 0.50    Years: 15.00    Pack years: 7.50    Types: Cigarettes  . Smokeless tobacco: Never Used  Substance Use Topics  . Alcohol use: No  . Drug use: No    Review of Systems  Constitutional: No fever/chills Eyes: No visual changes. ENT: No sore throat. Respiratory: Denies cough Genitourinary: Negative for dysuria. Rectal: Positive  for rectal pain and bleeding Musculoskeletal: Negative for back pain. Skin: Negative for rash.    ____________________________________________   PHYSICAL EXAM:  VITAL SIGNS: ED Triage Vitals  Enc Vitals Group     BP 10/05/18 0855 (!) 145/89     Pulse Rate 10/05/18 0855 (!) 110     Resp 10/05/18 0855 16     Temp 10/05/18 0855 98.4 F (36.9 C)     Temp Source 10/05/18 0855 Oral     SpO2 10/05/18 0855 96 %     Weight 10/05/18 0856 205 lb (93 kg)     Height 10/05/18 0856 5\' 8"  (1.727 m)     Head Circumference --      Peak Flow --      Pain Score 10/05/18 0856 8     Pain Loc --      Pain Edu? --      Excl. in [place]? --     Constitutional: Alert and oriented. Well appearing and in no acute distress. Eyes: Conjunctivae are normal.  Head: Atraumatic. Nose: No congestion/rhinnorhea. Mouth/Throat: Mucous membranes are moist.   Neck:  supple no lymphadenopathy noted Cardiovascular: Normal rate, regular rhythm.  Respiratory: Normal respiratory effort.  No retractions, GU: deferred Rectal exam: Rectal exam shows prolapsed internal hemorrhoids, one open area with recent bleeding noted, no thrombosis is noted Musculoskeletal: FROM all extremities,  warm and well perfused Neurologic:  Normal speech and language.  Skin:  Skin is warm, dry and intact. No rash noted. Psychiatric: Mood and affect are normal. Speech and behavior are normal.  ____________________________________________   LABS (all labs ordered are listed, but only abnormal results are displayed)  Labs Reviewed - No data to display ____________________________________________   ____________________________________________  RADIOLOGY    ____________________________________________   PROCEDURES  Procedure(s) performed: No  Procedures    ____________________________________________   INITIAL IMPRESSION / ASSESSMENT AND PLAN / ED COURSE  Pertinent labs & imaging results that were available during my  care of the patient were reviewed by me and considered in my medical decision making (see chart for details).   Patient presents emergency department complaint of rectal pain and bleeding.  See HPI  Physical exam shows prolapsed internal hemorrhoids.  Explained the findings to the patient.  He was given a prescription for Anusol HC.  He is to continue to soak in warm water with Epson salts.  He was given a work note for the next 2 days and then he is to go on light duty for at least 1 week, he should not push or pull or lift more than 20 pounds.  He is to follow-up with surgery.  Use MiraLAX and stool softener.  Return if worsening.    [name] was evaluated in Emergency Department on 10/05/2018 for the symptoms described in the history of present illness. He was evaluated in the context of the global COVID-19 pandemic, which necessitated consideration that the patient might be at risk for infection with the SARS-CoV-2 virus that causes COVID-19. Institutional protocols and algorithms that pertain to the evaluation of patients at risk for COVID-19 are in a state of rapid change based on information released by regulatory bodies including the CDC and federal and state organizations. These policies and algorithms were followed during the patient's care in the ED.   As part of my medical decision making, I reviewed the following data within the [place] History obtained from family, Nursing notes reviewed and incorporated, Old chart reviewed, Notes from prior ED visits and [place] Controlled Substance Database  ____________________________________________   FINAL CLINICAL IMPRESSION(S) / ED DIAGNOSES  Final diagnoses:  Prolapsed internal hemorrhoids      NEW MEDICATIONS STARTED DURING THIS VISIT:  New Prescriptions   HYDROCORTISONE (ANUSOL-HC) 25 MG SUPPOSITORY    Place 1 suppository (25 mg total) rectally 2 (two) times daily.     Note:  This document was prepared  using Dragon voice recognition software and may include unintentional dictation errors.    Versie Starks, PA-C 10/05/18 8469    Arta Silence, MD 10/05/18 1059

## 2018-10-28 ENCOUNTER — Other Ambulatory Visit: Payer: Self-pay | Admitting: *Deleted

## 2018-10-28 ENCOUNTER — Ambulatory Visit: Payer: Self-pay | Admitting: Surgery

## 2018-10-28 DIAGNOSIS — Z20822 Contact with and (suspected) exposure to covid-19: Secondary | ICD-10-CM

## 2018-10-30 LAB — NOVEL CORONAVIRUS, NAA: SARS-CoV-2, NAA: NOT DETECTED

## 2019-01-30 ENCOUNTER — Encounter: Payer: Self-pay | Admitting: *Deleted

## 2019-03-19 ENCOUNTER — Other Ambulatory Visit: Payer: Self-pay

## 2019-03-19 ENCOUNTER — Emergency Department
Admission: EM | Admit: 2019-03-19 | Discharge: 2019-03-19 | Disposition: A | Discharge status: Home / Self Care | Payer: Self-pay | Attending: Emergency Medicine | Admitting: Emergency Medicine

## 2019-03-19 ENCOUNTER — Emergency Department: Payer: Self-pay

## 2019-03-19 DIAGNOSIS — M19012 Primary osteoarthritis, left shoulder: Secondary | ICD-10-CM | POA: Insufficient documentation

## 2019-03-19 DIAGNOSIS — M25512 Pain in left shoulder: Secondary | ICD-10-CM | POA: Insufficient documentation

## 2019-03-19 DIAGNOSIS — X503XXA Overexertion from repetitive movements, initial encounter: Secondary | ICD-10-CM | POA: Insufficient documentation

## 2019-03-19 DIAGNOSIS — X500XXA Overexertion from strenuous movement or load, initial encounter: Secondary | ICD-10-CM | POA: Insufficient documentation

## 2019-03-19 DIAGNOSIS — F17210 Nicotine dependence, cigarettes, uncomplicated: Secondary | ICD-10-CM | POA: Insufficient documentation

## 2019-03-19 IMAGING — CR DG SHOULDER 2+V*L*
1 series · 3 of 3 positions shown · non-contrast
Comparison: Chest x-ray [DATE].

CLINICAL DATA: Pain.  No recent injury.

EXAM:
LEFT SHOULDER - 2+ VIEW

[Series 1: dg shoulder left · 0.14mm/px · 3 of 3 slices shown]
[im 1/3]
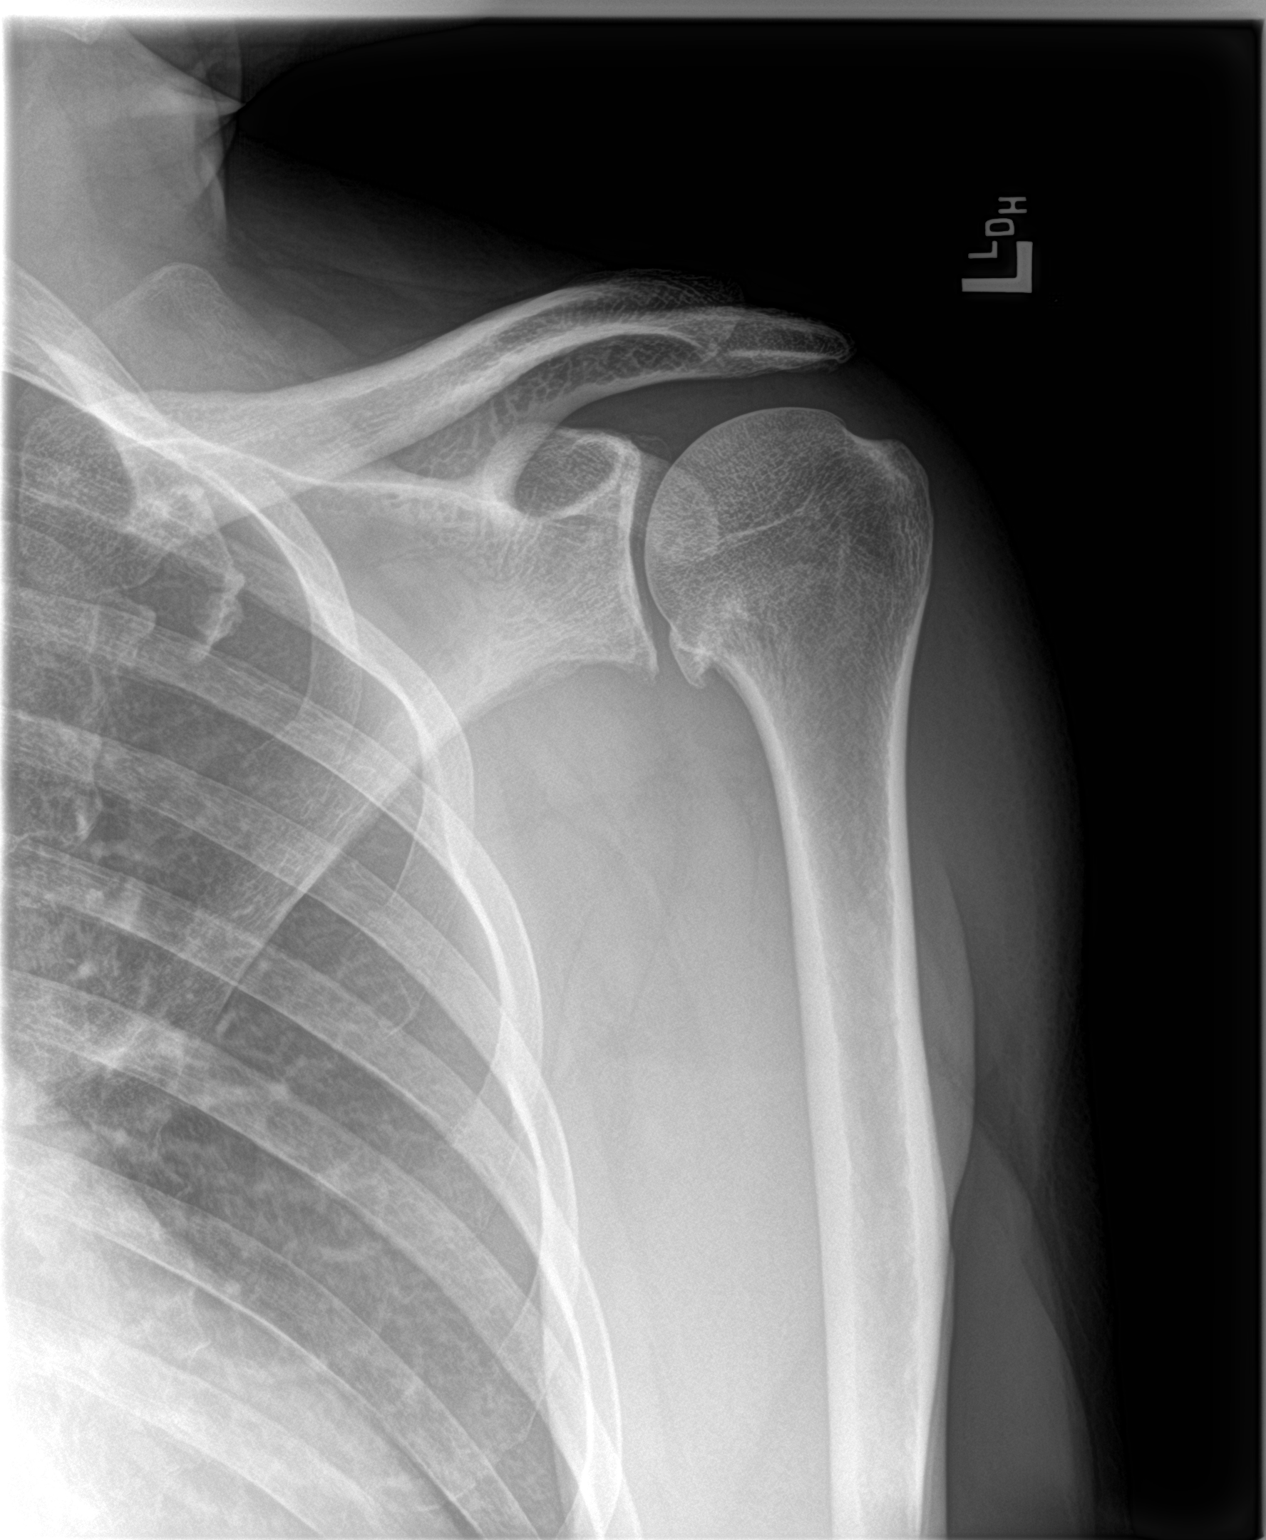
[im 2/3]
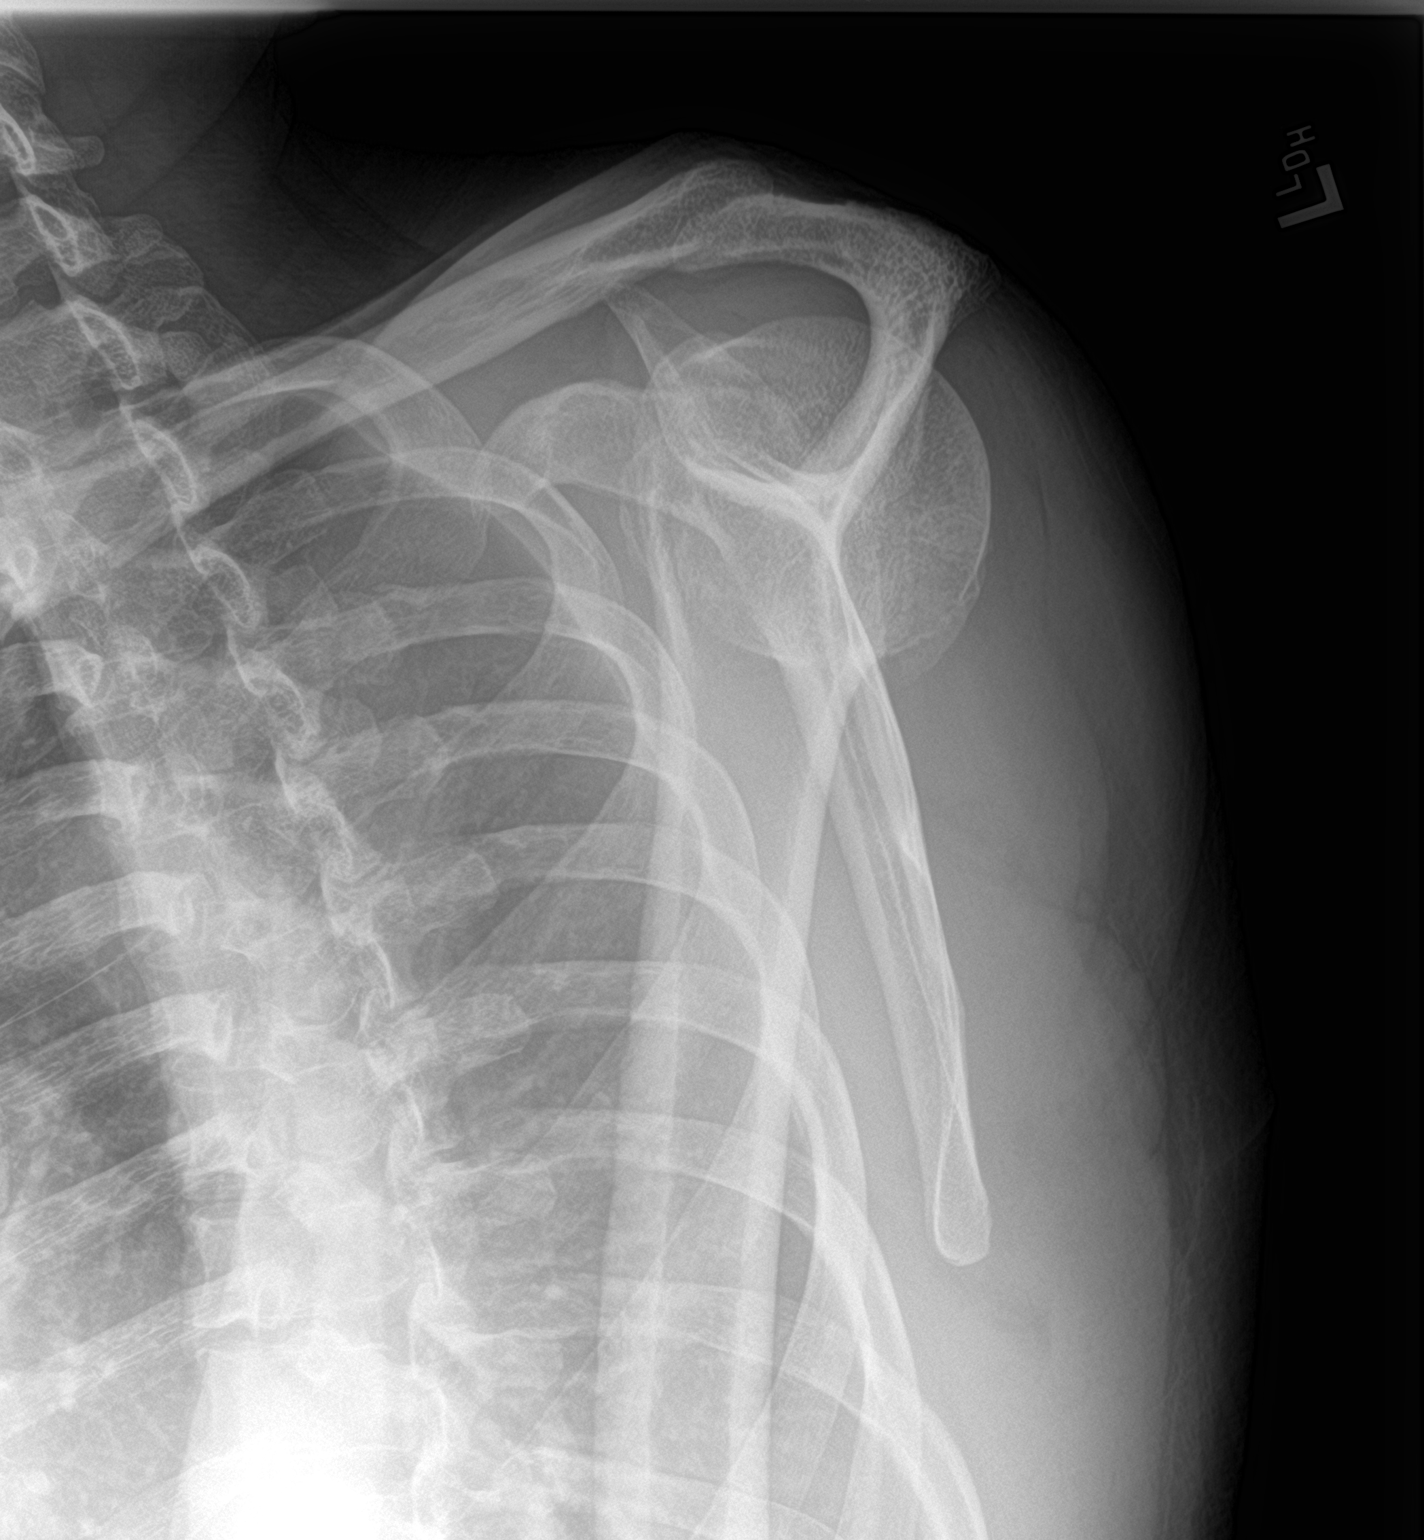
[im 3/3]
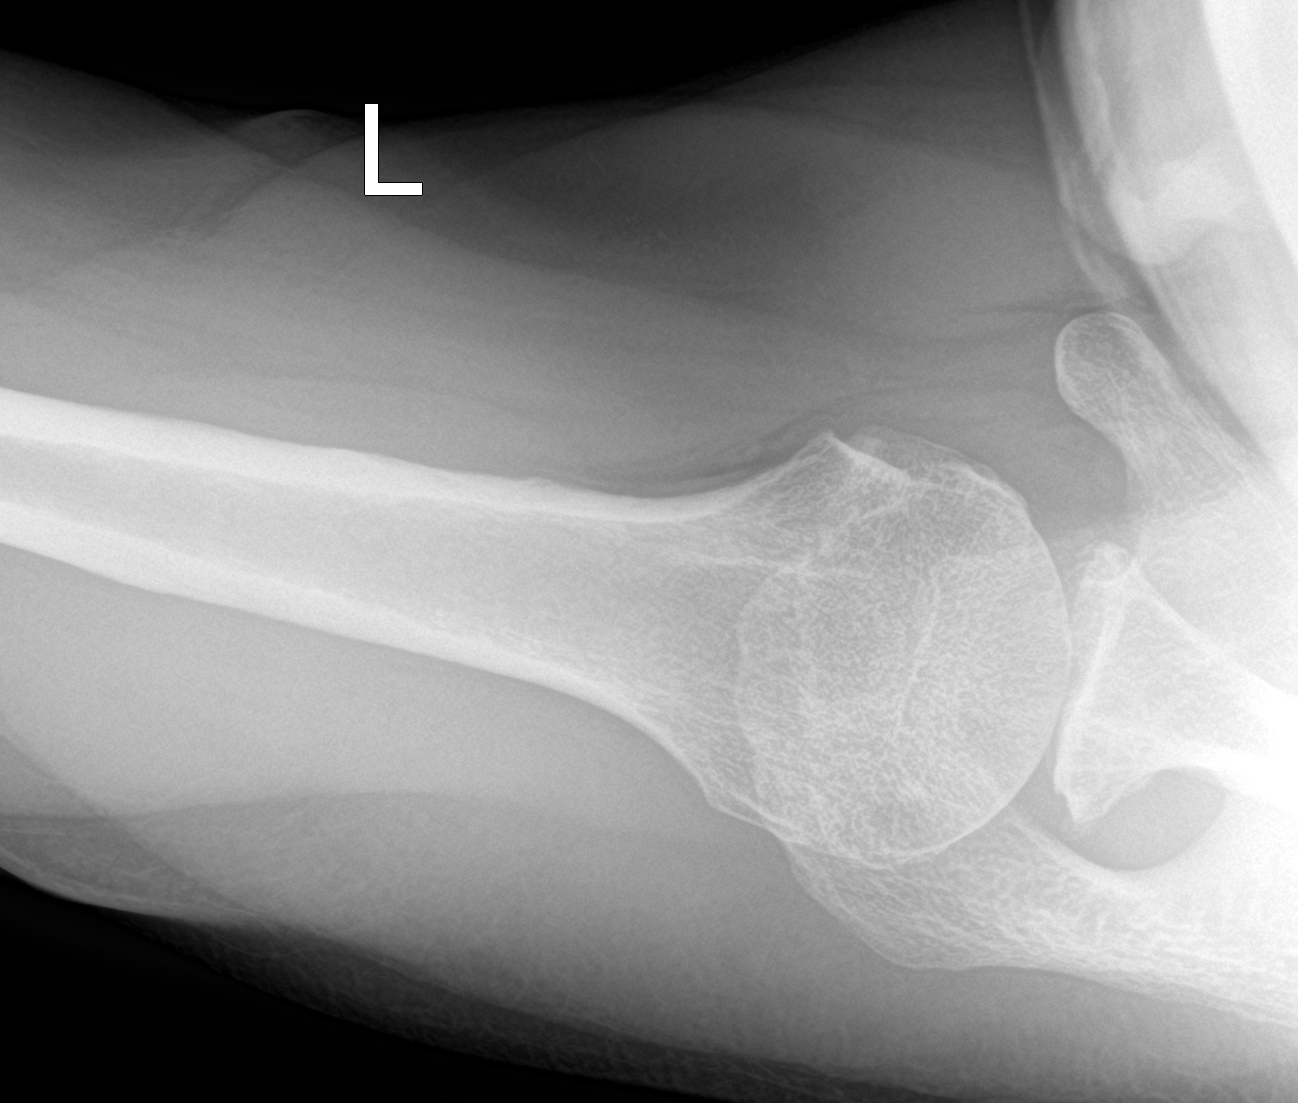

[3 of 3 positions shown; findings below may reference images not displayed]

FINDINGS: Acromioclavicular and glenohumeral degenerative change. No acute
bony abnormality identified. No evidence of fracture or dislocation.
IMPRESSION: Acromioclavicular and glenohumeral degenerative change. No acute
abnormality identified.

## 2019-03-19 MED ORDER — KETOROLAC TROMETHAMINE 10 MG PO TABS: 10.0000 mg | ORAL_TABLET | Freq: Four times a day (QID) | ORAL | 0 refills | Status: DC | PRN

## 2019-03-19 MED ORDER — KETOROLAC TROMETHAMINE 30 MG/ML IJ SOLN
30.0000 mg | Freq: Once | INTRAMUSCULAR | Status: AC
  Administered 2019-03-19: 30 mg via INTRAMUSCULAR
  Filled 2019-03-19: qty 1

## 2019-03-19 NOTE — Discharge Instructions (Signed)
Your x-ray shows some arthritis in your left shoulder.  You can take Toradol for pain and inflammation.  Please do not take any additional ibuprofen.  Follow-up with orthopedics for reevaluation.

## 2019-03-19 NOTE — ED Triage Notes (Signed)
Pt arrives to ED via POV from home with c/o left shoulder pain x2 days. No recent injury or trauma; intact CMS. Pt is A&O, in NAD; RR even, regular, and unlabored.

## 2019-03-19 NOTE — ED Notes (Signed)
See triage note.  Presents with a 2 day hx of left shoulder pain  States he is not sure of injury but does a lot of repetitive movement  No deformity note  But area is tender on palpation
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
## 2019-04-09 ENCOUNTER — Emergency Department: Payer: Self-pay

## 2019-04-09 ENCOUNTER — Emergency Department
Admission: EM | Admit: 2019-04-09 | Discharge: 2019-04-09 | Disposition: A | Discharge status: Home / Self Care | Payer: Self-pay | Attending: Emergency Medicine | Admitting: Emergency Medicine

## 2019-04-09 ENCOUNTER — Encounter: Payer: Self-pay | Admitting: Emergency Medicine

## 2019-04-09 ENCOUNTER — Other Ambulatory Visit: Payer: Self-pay

## 2019-04-09 DIAGNOSIS — F17210 Nicotine dependence, cigarettes, uncomplicated: Secondary | ICD-10-CM | POA: Insufficient documentation

## 2019-04-09 DIAGNOSIS — K611 Rectal abscess: Secondary | ICD-10-CM | POA: Insufficient documentation

## 2019-04-09 LAB — CBC WITH DIFFERENTIAL/PLATELET
Abs Immature Granulocytes: 0.04 10*3/uL (ref 0.00–0.07)
Basophils Absolute: 0.1 10*3/uL (ref 0.0–0.1)
Basophils Relative: 0 %
Eosinophils Absolute: 0.2 10*3/uL (ref 0.0–0.5)
Eosinophils Relative: 1 %
HCT: 41.6 % (ref 39.0–52.0)
Hemoglobin: 14 g/dL (ref 13.0–17.0)
Immature Granulocytes: 0 %
Lymphocytes Relative: 20 %
Lymphs Abs: 2.8 10*3/uL (ref 0.7–4.0)
MCH: 31.2 pg (ref 26.0–34.0)
MCHC: 33.7 g/dL (ref 30.0–36.0)
MCV: 92.7 fL (ref 80.0–100.0)
Monocytes Absolute: 1 10*3/uL (ref 0.1–1.0)
Monocytes Relative: 7 %
Neutro Abs: 9.9 10*3/uL — ABNORMAL HIGH (ref 1.7–7.7)
Neutrophils Relative %: 72 %
Platelets: 195 10*3/uL (ref 150–400)
RBC: 4.49 MIL/uL (ref 4.22–5.81)
RDW: 14.9 % (ref 11.5–15.5)
WBC: 14 10*3/uL — ABNORMAL HIGH (ref 4.0–10.5)
nRBC: 0 % (ref 0.0–0.2)

## 2019-04-09 LAB — BASIC METABOLIC PANEL
Anion gap: 8 (ref 5–15)
BUN: 13 mg/dL (ref 6–20)
CO2: 23 mmol/L (ref 22–32)
Calcium: 9.5 mg/dL (ref 8.9–10.3)
Chloride: 108 mmol/L (ref 98–111)
Creatinine, Ser: 1.11 mg/dL (ref 0.61–1.24)
GFR calc Af Amer: >60 mL/min (ref >60)
GFR calc non Af Amer: >60 mL/min (ref >60)
Glucose, Bld: 107 mg/dL — ABNORMAL HIGH (ref 70–99)
Potassium: 4.3 mmol/L (ref 3.5–5.1)
Sodium: 139 mmol/L (ref 135–145)

## 2019-04-09 IMAGING — CT CT ABD-PELV W/ CM
2 of 5 series · 15 of 46 positions shown, 17 images · IV contrast (APPLIED)
Comparison: [DATE]

CLINICAL DATA: Perianal abscess.

EXAM:
CT ABDOMEN AND PELVIS WITH CONTRAST
TECHNIQUE: Multidetector CT imaging of the abdomen and pelvis was performed
using the standard protocol following bolus administration of
intravenous contrast.
CONTRAST:  100mL OMNIPAQUE IOHEXOL 300 MG/ML  SOLN

[Series 2: axial st · axial · 0.82mm/px · z∈[-617,-117]mm · 12 of 112 slices shown, 14 images]
[im 6/112  soft-tissue]
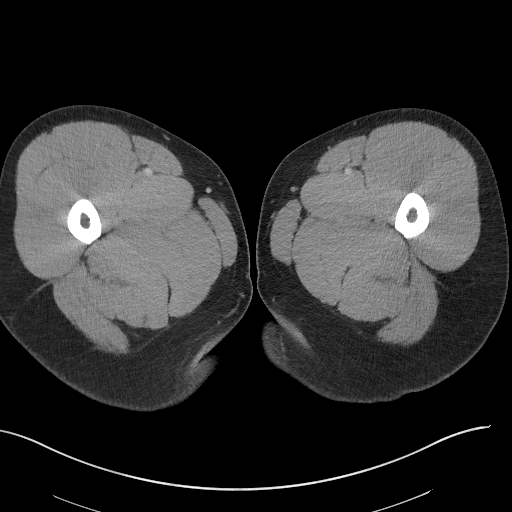
[im 6/112  bone]
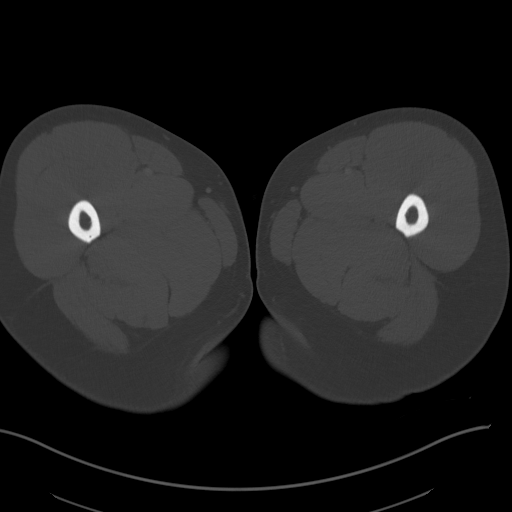
[im 17/112  soft-tissue]
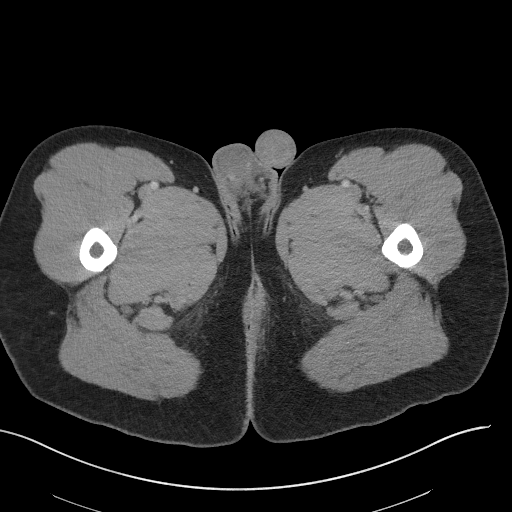
[im 23/112  soft-tissue]
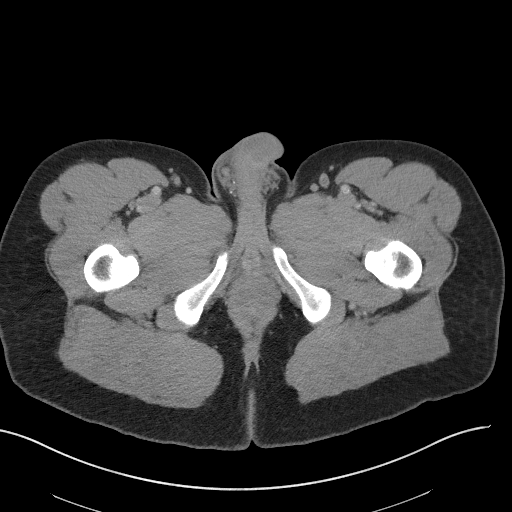
[im 34/112  soft-tissue]
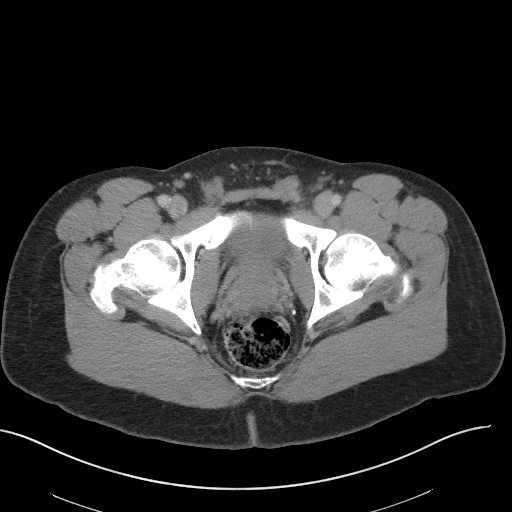
[im 45/112  soft-tissue]
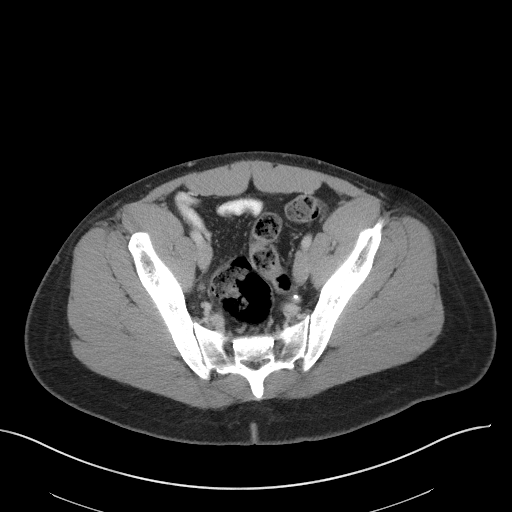
[im 50/112  soft-tissue]
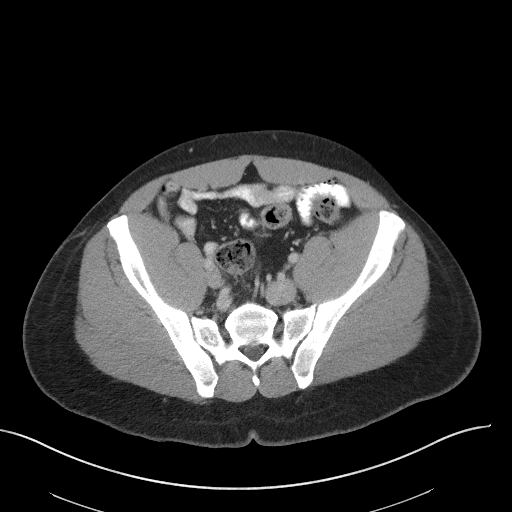
[im 62/112  soft-tissue]
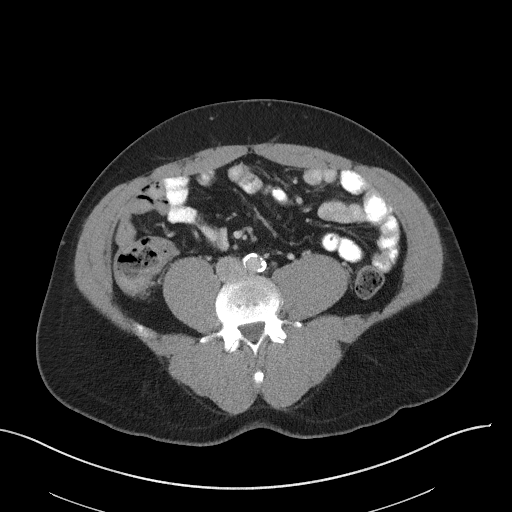
[im 67/112  soft-tissue]
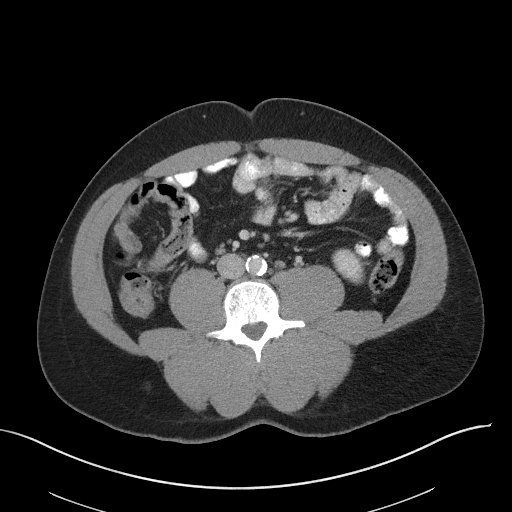
[im 78/112  soft-tissue]
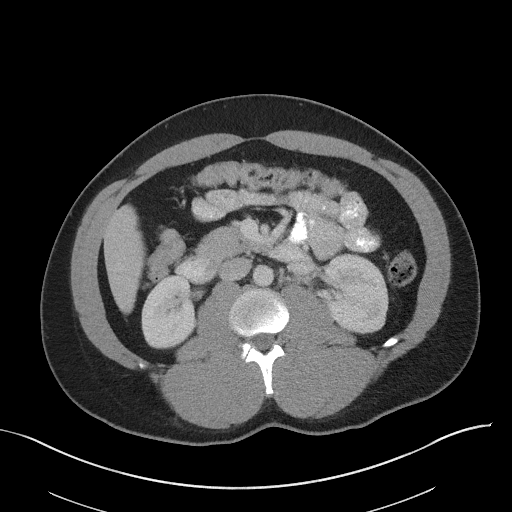
[im 78/112  bone]
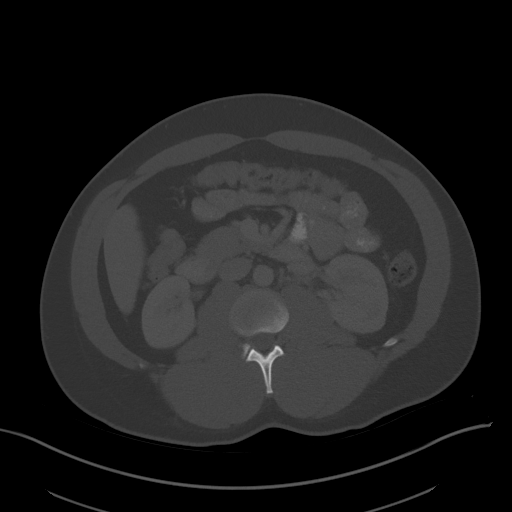
[im 89/112  soft-tissue]
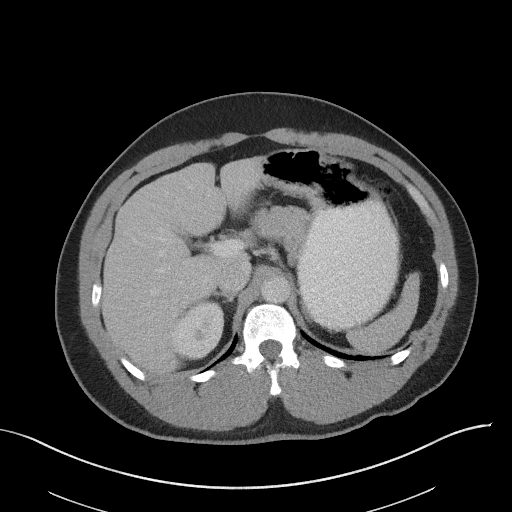
[im 95/112  soft-tissue]
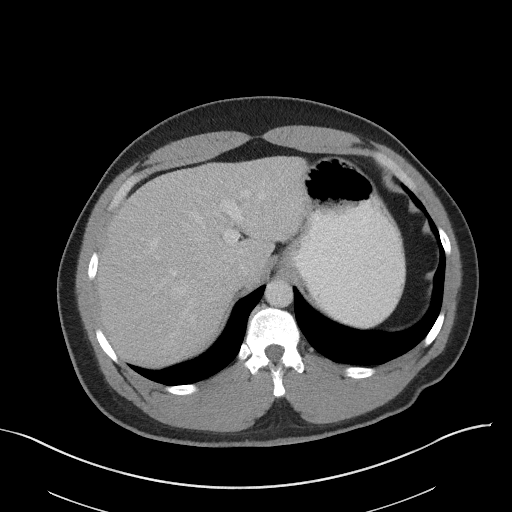
[im 106/112  soft-tissue]
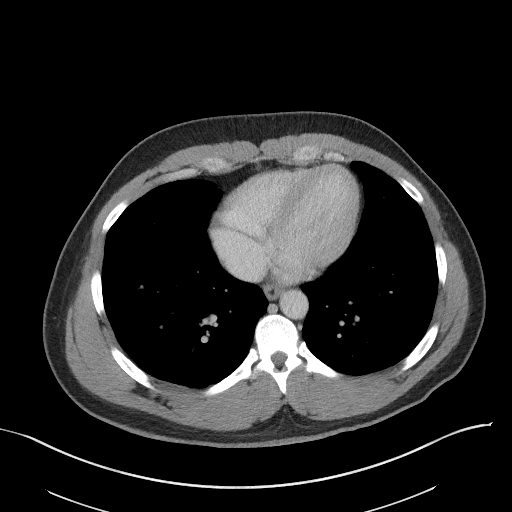

[Series 5: coronal st · coronal · 0.76mm/px · 3 of 96 slices shown]
[im 32/96  soft-tissue]
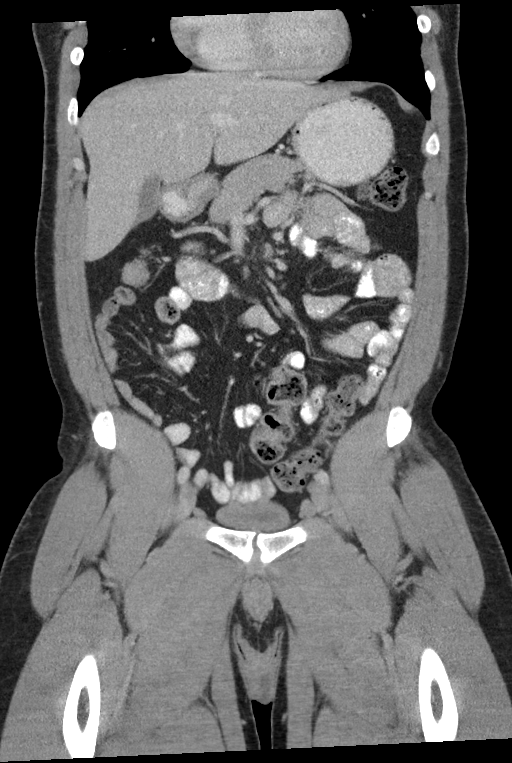
[im 43/96  soft-tissue]
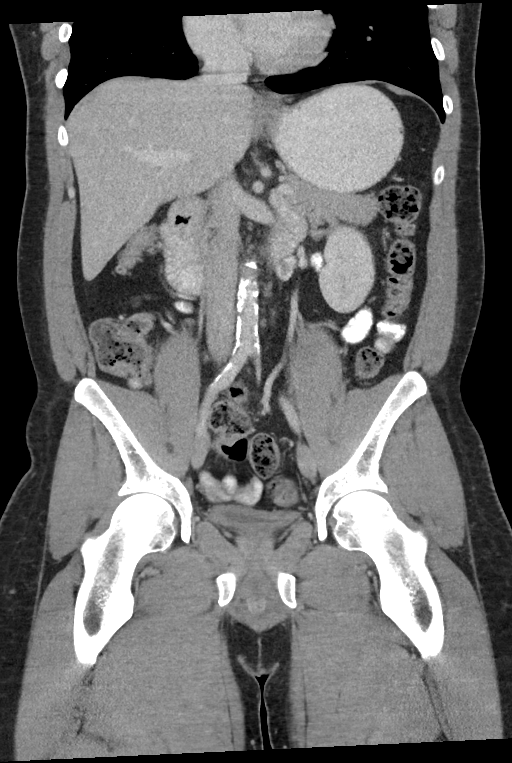
[im 53/96  soft-tissue]
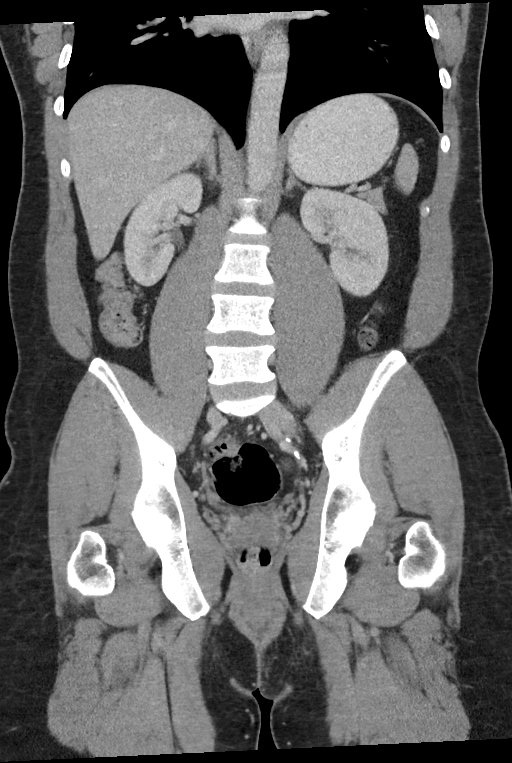

[15 of 46 positions shown; findings below may reference images not displayed]

FINDINGS: Lower chest: Insert lung bases

Hepatobiliary: No focal hepatic lesions or intrahepatic biliary
dilatation. The gallbladder is normal. No common bile duct
dilatation.

Pancreas: No mass, inflammation or ductal dilatation.

Spleen: Normal size. No focal lesions.

Adrenals/Urinary Tract: Adrenal the adrenal glands and kidneys are
normal. The bladder is normal.

Stomach/Bowel: The stomach, duodenum, small bowel and colon are
unremarkable. No acute inflammatory changes, mass lesions or
obstructive findings. The terminal ileum and appendix are normal.

No perirectal inflammatory changes or abscess. There is however a
perianal fistula and associated small abscess on the left buttock
near the gluteal cleft. This measures 2 cm. MRI pelvis without and
with contrast with perianal fistula protocol may be helpful for
further evaluation.

Vascular/Lymphatic: The aorta is normal in caliber. No dissection.
Significant age advanced atherosclerotic calcifications involving
the distal aorta and iliac arteries. The branch vessels are patent.
The major venous structures are patent. No mesenteric or
retroperitoneal mass or adenopathy. Small scattered lymph nodes are
noted.

Reproductive: The prostate gland and seminal vesicles are
unremarkable.

Other: No pelvic mass or adenopathy. No free pelvic fluid
collections. No inguinal mass or adenopathy. No abdominal wall
hernia or subcutaneous lesions.

Musculoskeletal: No significant bony findings.
IMPRESSION: 1. Perianal fistula and associated 2 cm abscess in the left buttock
near the gluteal cleft. MRI pelvis without and with contrast with
perianal fistula protocol may be helpful for further evaluation.
2. No other significant abdominal/pelvic findings.
3. Significant age advanced atherosclerotic calcifications involving
the distal aorta and iliac arteries.

Aortic Atherosclerosis ([SN]-[SN]).

## 2019-04-09 MED ORDER — LIDOCAINE HCL (PF) 1 % IJ SOLN
5.0000 mL | Freq: Once | INTRAMUSCULAR | Status: AC
  Administered 2019-04-09: 5 mL via INTRADERMAL
  Filled 2019-04-09: qty 5

## 2019-04-09 MED ORDER — SODIUM CHLORIDE 0.9 % IV SOLN
1.0000 g | Freq: Once | INTRAVENOUS | Status: AC
  Administered 2019-04-09: 13:00:00 1 g via INTRAVENOUS
  Filled 2019-04-09: qty 10

## 2019-04-09 MED ORDER — OXYCODONE-ACETAMINOPHEN 5-325 MG PO TABS
1.0000 | ORAL_TABLET | Freq: Once | ORAL | Status: AC
  Administered 2019-04-09: 11:00:00 1 via ORAL
  Filled 2019-04-09: qty 1

## 2019-04-09 MED ORDER — SULFAMETHOXAZOLE-TRIMETHOPRIM 800-160 MG PO TABS: 1.0000 | ORAL_TABLET | Freq: Two times a day (BID) | ORAL | 0 refills | Status: DC

## 2019-04-09 MED ORDER — IOHEXOL 9 MG/ML PO SOLN
1000.0000 mL | Freq: Once | ORAL | Status: AC
  Administered 2019-04-09: 1000 mL via ORAL
  Filled 2019-04-09: qty 1000

## 2019-04-09 MED ORDER — LIDOCAINE-PRILOCAINE 2.5-2.5 % EX CREA
TOPICAL_CREAM | Freq: Once | CUTANEOUS | Status: AC
  Filled 2019-04-09: qty 5

## 2019-04-09 MED ORDER — SULFAMETHOXAZOLE-TRIMETHOPRIM 800-160 MG PO TABS
1.0000 | ORAL_TABLET | Freq: Once | ORAL | Status: AC
  Administered 2019-04-09: 1 via ORAL
  Filled 2019-04-09: qty 1

## 2019-04-09 MED ORDER — IOHEXOL 300 MG/ML  SOLN
100.0000 mL | Freq: Once | INTRAMUSCULAR | Status: AC | PRN
  Administered 2019-04-09: 11:00:00 100 mL via INTRAVENOUS
  Filled 2019-04-09: qty 100

## 2019-04-09 NOTE — ED Triage Notes (Signed)
Pt reports boil between his butt cheeks for the last 3-4 days that is painful.

## 2019-04-09 NOTE — ED Provider Notes (Signed)
[facility] Emergency Department Provider Note  ____________________________________________  Time seen: Approximately 8:45 AM  I have reviewed the triage vital signs and the nursing notes.   HISTORY  Chief Complaint Abscess    HPI Edward Owens is a 44 y.o. male that presents to the emergency department for evaluation of abscess near her rectum for 4 days.  Patient has had difficulty sitting due to pain.  He has had abscesses in the past but never in this location.  He read on the Internet that he should come to the emergency department.  No fever, chills, nausea, vomiting, abdominal pain.  Past Medical History:  Diagnosis Date  . Migraine     Patient Active Problem List   Diagnosis Date Noted  . Migraine     Past Surgical History:  Procedure Laterality Date  . HERNIA REPAIR    . LYMPH NODE DISSECTION Left    groin    Prior to Admission medications   Medication Sig Start Date End Date Taking? Authorizing Provider  sulfamethoxazole-trimethoprim (BACTRIM DS) 800-160 MG tablet Take 1 tablet by mouth 2 (two) times daily. 04/09/19   Enid Derry, PA-C    Allergies Patient has no known allergies.  No family history on file.  Social History Social History   Tobacco Use  . Smoking status: Current Every Day Smoker    Packs/day: 0.50    Years: 15.00    Pack years: 7.50    Types: Cigarettes  . Smokeless tobacco: Never Used  Substance Use Topics  . Alcohol use: No  . Drug use: No     Review of Systems  Constitutional: No fever/chills ENT: No upper respiratory complaints. Cardiovascular: No chest pain. Respiratory: No cough. No SOB. Gastrointestinal: No abdominal pain.  No nausea, no vomiting.  Musculoskeletal: Negative for musculoskeletal pain. Skin: Negative for abrasions, lacerations, ecchymosis. Positive for rash.   ____________________________________________   PHYSICAL EXAM:  VITAL SIGNS: ED Triage Vitals  Enc Vitals  Group     BP 04/09/19 0838 138/78     Pulse Rate 04/09/19 0838 88     Resp 04/09/19 0838 18     Temp 04/09/19 0838 98 F (36.7 C)     Temp Source 04/09/19 0838 Oral     SpO2 04/09/19 0838 98 %     Weight 04/09/19 0821 200 lb (90.7 kg)     Height 04/09/19 0821 5\' 9"  (1.753 m)     Head Circumference --      Peak Flow --      Pain Score 04/09/19 0821 8     Pain Loc --      Pain Edu? --      Excl. in GC? --      Constitutional: Alert and oriented. Well appearing and in no acute distress. Eyes: Conjunctivae are normal. PERRL. EOMI. Head: Atraumatic. ENT:      Ears:      Nose: No congestion/rhinnorhea.      Mouth/Throat: Mucous membranes are moist.  Neck: No stridor.  Cardiovascular: Normal rate, regular rhythm.  Good peripheral circulation. Respiratory: Normal respiratory effort without tachypnea or retractions. Lungs CTAB. Good air entry to the bases with no decreased or absent breath sounds. Gastrointestinal: Bowel sounds 4 quadrants. Soft and nontender to palpation. No guarding or rigidity. No palpable masses. No distention Rectal: 2 cm by 2 cm area of swelling and fluctuance to left buttocks proximal to rectum Musculoskeletal: Full range of motion to all extremities. No gross deformities appreciated. Neurologic:  Normal speech and language. No gross focal neurologic deficits are appreciated.  Skin:  Skin is warm, dry and intact. No rash noted. Psychiatric: Mood and affect are normal. Speech and behavior are normal. Patient exhibits appropriate insight and judgement.   ____________________________________________   LABS (all labs ordered are listed, but only abnormal results are displayed)  Labs Reviewed  CBC WITH DIFFERENTIAL/PLATELET - Abnormal; Notable for the following components:      Result Value   WBC 14.0 (*)    Neutro Abs 9.9 (*)    All other components within normal limits  BASIC METABOLIC PANEL - Abnormal; Notable for the following components:   Glucose, Bld  107 (*)    All other components within normal limits   ____________________________________________  EKG   ____________________________________________  RADIOLOGY   CT ABDOMEN PELVIS W CONTRAST  Result Date: 04/09/2019 CLINICAL DATA:  Perianal abscess. EXAM: CT ABDOMEN AND PELVIS WITH CONTRAST TECHNIQUE: Multidetector CT imaging of the abdomen and pelvis was performed using the standard protocol following bolus administration of intravenous contrast. CONTRAST:  172mL OMNIPAQUE IOHEXOL 300 MG/ML  SOLN COMPARISON:  03/26/2014 FINDINGS: Lower chest: Insert lung bases Hepatobiliary: No focal hepatic lesions or intrahepatic biliary dilatation. The gallbladder is normal. No common bile duct dilatation. Pancreas: No mass, inflammation or ductal dilatation. Spleen: Normal size. No focal lesions. Adrenals/Urinary Tract: Adrenal the adrenal glands and kidneys are normal. The bladder is normal. Stomach/Bowel: The stomach, duodenum, small bowel and colon are unremarkable. No acute inflammatory changes, mass lesions or obstructive findings. The terminal ileum and appendix are normal. No perirectal inflammatory changes or abscess. 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 This measures 2 cm. MRI pelvis without and with contrast with perianal fistula protocol may be helpful for further evaluation. Vascular/Lymphatic: The aorta is normal in caliber. No dissection. Significant age advanced atherosclerotic calcifications involving the distal aorta and iliac arteries. The branch vessels are patent. The major venous structures are patent. No mesenteric or retroperitoneal mass or adenopathy. Small scattered lymph nodes are noted. Reproductive: The prostate gland and seminal vesicles are unremarkable. Other: No pelvic mass or adenopathy. No free pelvic fluid collections. No inguinal mass or adenopathy. No abdominal wall hernia or subcutaneous lesions.  Musculoskeletal: No significant bony findings. IMPRESSION: 1. 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 MRI pelvis without and with contrast with perianal fistula protocol may be helpful for further evaluation. 2. No other significant abdominal/pelvic findings. 3. Significant age advanced atherosclerotic calcifications involving the distal aorta and iliac arteries. Aortic Atherosclerosis (ICD10-I70.0). Electronically Signed   By: Marijo Sanes M.D.   On: 04/09/2019 11:32    ____________________________________________    PROCEDURES  Procedure(s) performed:    Procedures  INCISION AND DRAINAGE Performed by: Laban Emperor Consent: Verbal consent obtained. Risks and benefits: risks, benefits and alternatives were discussed Type: abscess  Body area: rectal  Anesthesia: local infiltration  Incision was made with a scalpel.  Local anesthetic: lidocaine 1 % without epinephrine  Anesthetic total: 2 ml  Complexity: complex Blunt dissection to break up loculations  Drainage: purulent  Drainage amount: moderate  Packing material: 1/4 in iodoform gauze  Patient tolerance: Patient tolerated the procedure well with no immediate complications.     Medications  lidocaine-prilocaine (EMLA) cream ( Topical Given 04/09/19 0924)  lidocaine (PF) (XYLOCAINE) 1 % injection 5 mL (5 mLs Intradermal Given 04/09/19 0924)  iohexol (OMNIPAQUE) 9 MG/ML  oral solution 1,000 mL (1,000 mLs Oral Contrast Given 04/09/19 1042)  oxyCODONE-acetaminophen (PERCOCET/ROXICET) 5-325 MG per tablet 1 tablet (1 tablet Oral Given 04/09/19 1042)  iohexol (OMNIPAQUE) 300 MG/ML solution 100 mL (100 mLs Intravenous Contrast Given 04/09/19 1100)  cefTRIAXone (ROCEPHIN) 1 g in sodium chloride 0.9 % 100 mL IVPB (0 g Intravenous Stopped 04/09/19 1308)  sulfamethoxazole-trimethoprim (BACTRIM DS) 800-160 MG per tablet 1 tablet (1 tablet Oral Given 04/09/19 1445)      ____________________________________________   INITIAL IMPRESSION / ASSESSMENT AND PLAN / ED COURSE  Pertinent labs & imaging results that were available during my care of the patient were reviewed by me and considered in my medical decision making (see chart for details).  Review of the [place] CSRS was performed in accordance of the [facility] prior to dispensing any controlled drugs.    Patient's diagnosis is consistent with perirectal abscess.   ----------------------------------------- 12:40 PM on 04/09/2019 -----------------------------------------  Radiology report of CT scan reports a perianal fistula and associated 2 cm abscess in the left buttocks near the gluteal cleft.  Laqueta Due with general surgery was paged to discuss patient case and CT scan results.  He will review images with Dr. Aleen Campi and return my call.  ----------------------------------------- 12:54 PM on 04/09/2019 -----------------------------------------  Images were reviewed by Dr. Tomasa Blase and Dr. Aleen Campi.  They do not visualize a fistula on CT scan.  They recommend incision and drainage by ED personnel and to follow-up with general surgery in 1 week for reevaluation.  ----------------------------------------- 2:00 PM on 04/09/2019 -----------------------------------------  Abscess was successfully drained in the emergency department and packed.  Patient will be discharged home with prescriptions for Bactrim. Patient is to follow up with general surgery as directed.  Patient will return to the emergency department in 2 days for packing removal and recheck.  Patient is given ED precautions to return to the ED for any worsening or new symptoms.   Edward Owens was evaluated in Emergency Department on 04/09/2019 for the symptoms described in the history of present illness. He was evaluated in the context of the global COVID-19 pandemic, which necessitated consideration that the patient might be at risk for  infection with the SARS-CoV-2 virus that causes COVID-19. Institutional protocols and algorithms that pertain to the evaluation of patients at risk for COVID-19 are in a state of rapid change based on information released by regulatory bodies including the CDC and federal and state organizations. These policies and algorithms were followed during the patient's care in the ED.  ____________________________________________  FINAL CLINICAL IMPRESSION(S) / ED DIAGNOSES  Final diagnoses:  Perirectal abscess      NEW MEDICATIONS STARTED DURING THIS VISIT:  ED Discharge Orders         Ordered    sulfamethoxazole-trimethoprim (BACTRIM DS) 800-160 MG tablet  2 times daily     04/09/19 1432              This chart was dictated using voice recognition software/Dragon. Despite best efforts to proofread, errors can occur which can change the meaning. Any change was purely unintentional.    Enid Derry, PA-C 04/09/19 1452    Sharman Cheek, MD 04/09/19 [phone]

## 2019-04-09 NOTE — ED Notes (Signed)
See triage note  Presents with possible abscess area to buttocks  States he noticed area this past weekend

## 2019-04-09 NOTE — ED Notes (Signed)
Pt has been to ct and back   Pt has been pacing in room  Awaiting ct results

## 2019-04-11 ENCOUNTER — Emergency Department
Admission: EM | Admit: 2019-04-11 | Discharge: 2019-04-11 | Disposition: A | Discharge status: Home / Self Care | Payer: Self-pay | Attending: Student in an Organized Health Care Education/Training Program | Admitting: Student in an Organized Health Care Education/Training Program

## 2019-04-11 ENCOUNTER — Other Ambulatory Visit: Payer: Self-pay

## 2019-04-11 ENCOUNTER — Encounter: Payer: Self-pay | Admitting: Emergency Medicine

## 2019-04-11 DIAGNOSIS — F17210 Nicotine dependence, cigarettes, uncomplicated: Secondary | ICD-10-CM | POA: Insufficient documentation

## 2019-04-11 DIAGNOSIS — Z79899 Other long term (current) drug therapy: Secondary | ICD-10-CM | POA: Insufficient documentation

## 2019-04-11 DIAGNOSIS — Z5189 Encounter for other specified aftercare: Secondary | ICD-10-CM

## 2019-04-11 DIAGNOSIS — Z4801 Encounter for change or removal of surgical wound dressing: Secondary | ICD-10-CM | POA: Insufficient documentation

## 2019-04-11 NOTE — Discharge Instructions (Signed)
Continue taking your antibiotic until completely finished.  You may also take sits baths or use a warm wash cough 2-3 times a day to the area to encourage increased drainage.  If any continued problems or worsening of your symptoms you should follow-up with the surgeon listed on your discharge papers.

## 2019-04-11 NOTE — ED Provider Notes (Signed)
[facility] Emergency Department Provider Note  ____________________________________________   First MD Initiated Contact with Patient 04/11/19 0827     (approximate)  I have reviewed the triage vital signs and the nursing notes.   HISTORY  Chief Complaint Wound Check   HPI TZION WEDEL is a 44 y.o. male presents to the ED for an abscess that was I&D several days ago.  Patient is here to have the drain removed.  Patient continues to take Bactrim DS twice daily without any difficulties.  He reports that is feeling much better and he is having no difficulties.  He rates pain as a 5/10.       Past Medical History:  Diagnosis Date  . Migraine     Patient Active Problem List   Diagnosis Date Noted  . Migraine     Past Surgical History:  Procedure Laterality Date  . HERNIA REPAIR    . LYMPH NODE DISSECTION Left    groin    Prior to Admission medications   Medication Sig Start Date End Date Taking? Authorizing Provider  sulfamethoxazole-trimethoprim (BACTRIM DS) 800-160 MG tablet Take 1 tablet by mouth 2 (two) times daily. 04/09/19   Enid Derry, PA-C    Allergies Patient has no known allergies.  History reviewed. No pertinent family history.  Social History Social History   Tobacco Use  . Smoking status: Current Every Day Smoker    Packs/day: 0.50    Years: 15.00    Pack years: 7.50    Types: Cigarettes  . Smokeless tobacco: Never Used  Substance Use Topics  . Alcohol use: No  . Drug use: No    Review of Systems Constitutional: No fever/chills Cardiovascular: Denies chest pain. Respiratory: Denies shortness of breath. Gastrointestinal: No abdominal pain.  No nausea, no vomiting.  Musculoskeletal: Negative for muscle aches. Skin: Negative for rash. Neurological: Negative for  focal weakness or numbness. ___________________________________________   PHYSICAL EXAM:  VITAL SIGNS: ED Triage Vitals  Enc Vitals Group   BP 04/11/19 0821 140/85     Pulse Rate 04/11/19 0821 91     Resp 04/11/19 0821 16     Temp 04/11/19 0821 98.7 F (37.1 C)     Temp Source 04/11/19 0821 Oral     SpO2 04/11/19 0821 94 %     Weight 04/11/19 0818 200 lb (90.7 kg)     Height 04/11/19 0818 5\' 9"  (1.753 m)     Head Circumference --      Peak Flow --      Pain Score 04/11/19 0818 5     Pain Loc --      Pain Edu? --      Excl. in GC? --     Constitutional: Alert and oriented. Well appearing and in no acute distress. Eyes: Conjunctivae are normal.  Head: Atraumatic. Neck: No stridor.   Cardiovascular: Normal rate, regular rhythm. Grossly normal heart sounds.  Good peripheral circulation. Respiratory: Normal respiratory effort.  No retractions. Lungs CTAB. Gastrointestinal: Soft and nontender. No distention.  Musculoskeletal: Moves upper and lower extremities that any difficulty.  Normal gait was noted. Neurologic:  Normal speech and language. No gross focal neurologic deficits are appreciated. No gait instability. Skin:  Skin is warm, dry.  Incision area appears to be healing well.  Minimal drainage today.  No extending cellulitis present. Psychiatric: Mood and affect are normal. Speech and behavior are normal.  ____________________________________________   LABS (all labs ordered are listed, but only abnormal  results are displayed)  Labs Reviewed - No data to display  PROCEDURES  Procedure(s) performed (including Critical Care):  Procedures   ____________________________________________   INITIAL IMPRESSION / ASSESSMENT AND PLAN / ED COURSE  As part of my medical decision making, I reviewed the following data within the electronic MEDICAL RECORD NUMBER Notes from prior ED visits and [place] Controlled Substance Database  44 year old male presents to the ED for packing removal from an abscess that he had lanced 2 days ago.  Patient is taking the Bactrim DS without any difficulties.  Packing had already fallen out on  its own.  Area looks good with minimal drainage.  Patient is encouraged to do sits baths or warm washcloths to the area frequently.  He is to return to the emergency department over the weekend if any fever, chills or worsening of the area.  He is to follow-up with Dr. Hampton Abbot if any continued problems. ____________________________________________   FINAL CLINICAL IMPRESSION(S) / ED DIAGNOSES  Final diagnoses:  Wound check, abscess     ED Discharge Orders    None       Note:  This document was prepared using Dragon voice recognition software and may include unintentional dictation errors.    Johnn Hai, PA-C 04/11/19 9166    Merlyn Lot, MD 04/11/19 1320

## 2019-04-11 NOTE — ED Triage Notes (Signed)
Pt states had a "boil" drained several days ago and needs to have "drain" removed.

## 2019-07-08 ENCOUNTER — Emergency Department: Payer: Self-pay

## 2019-07-08 ENCOUNTER — Encounter: Payer: Self-pay | Admitting: Emergency Medicine

## 2019-07-08 ENCOUNTER — Emergency Department
Admission: EM | Admit: 2019-07-08 | Discharge: 2019-07-09 | Disposition: A | Discharge status: Home / Self Care | Payer: Self-pay | Attending: Emergency Medicine | Admitting: Emergency Medicine

## 2019-07-08 DIAGNOSIS — L089 Local infection of the skin and subcutaneous tissue, unspecified: Secondary | ICD-10-CM

## 2019-07-08 DIAGNOSIS — L03012 Cellulitis of left finger: Secondary | ICD-10-CM | POA: Insufficient documentation

## 2019-07-08 DIAGNOSIS — F17210 Nicotine dependence, cigarettes, uncomplicated: Secondary | ICD-10-CM | POA: Insufficient documentation

## 2019-07-08 IMAGING — CR DG HAND COMPLETE 3+V*R*
1 series · 3 of 3 positions shown · non-contrast
Comparison: None.

CLINICAL DATA: Right thumb swelling x2 weeks.

EXAM:
RIGHT HAND - COMPLETE 3+ VIEW

[Series 1: x hand pa right · 0.14mm/px · 3 of 3 slices shown]
[im 1/3]
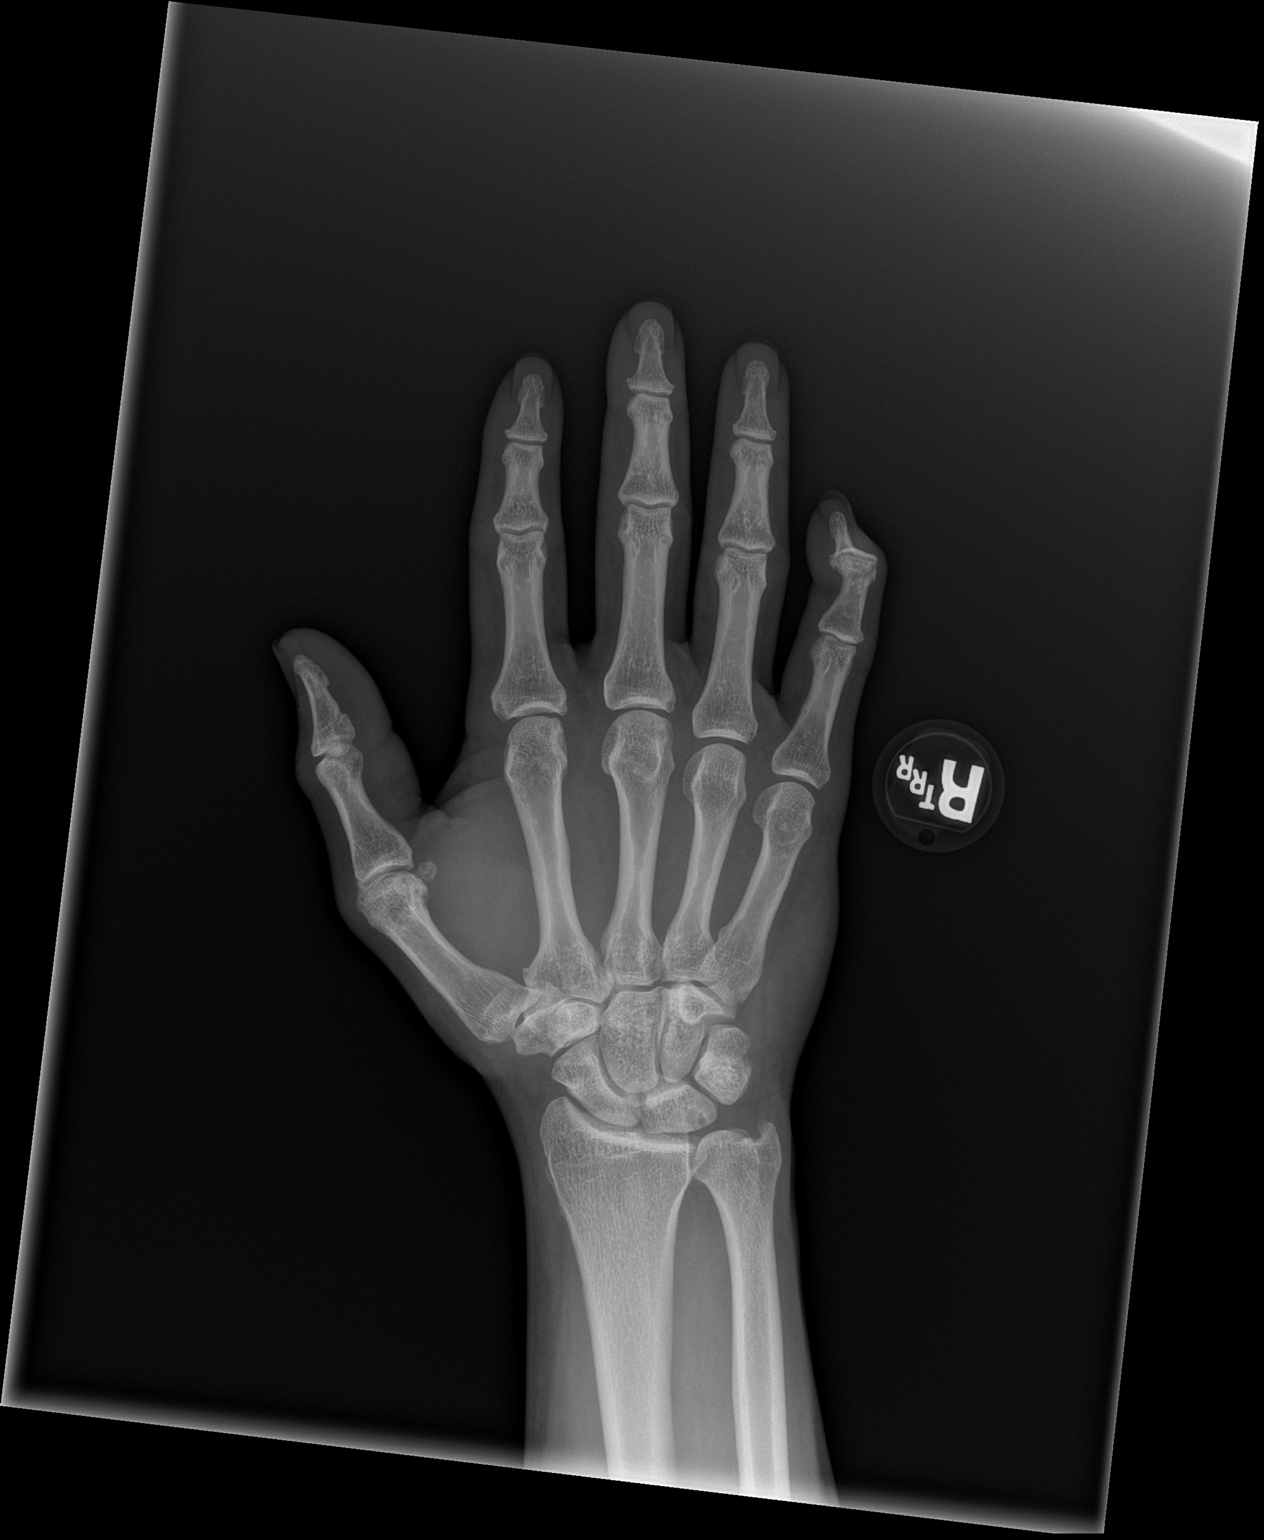
[im 2/3]
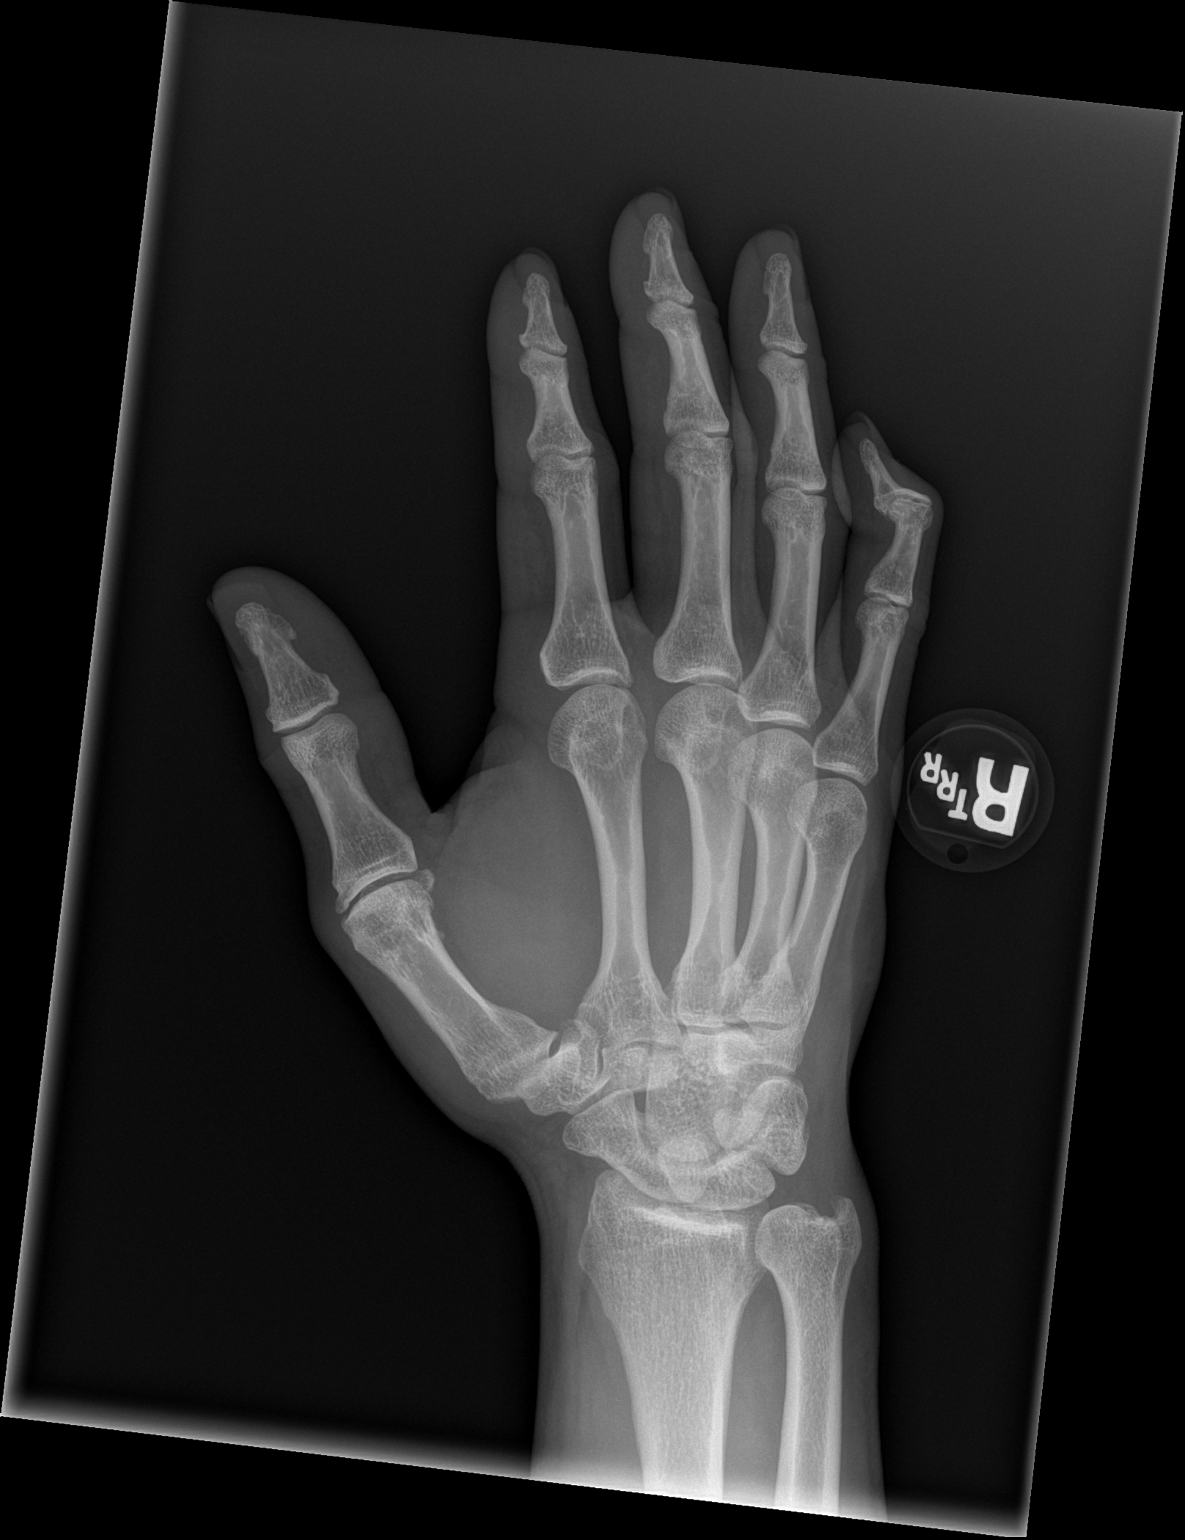
[im 3/3]
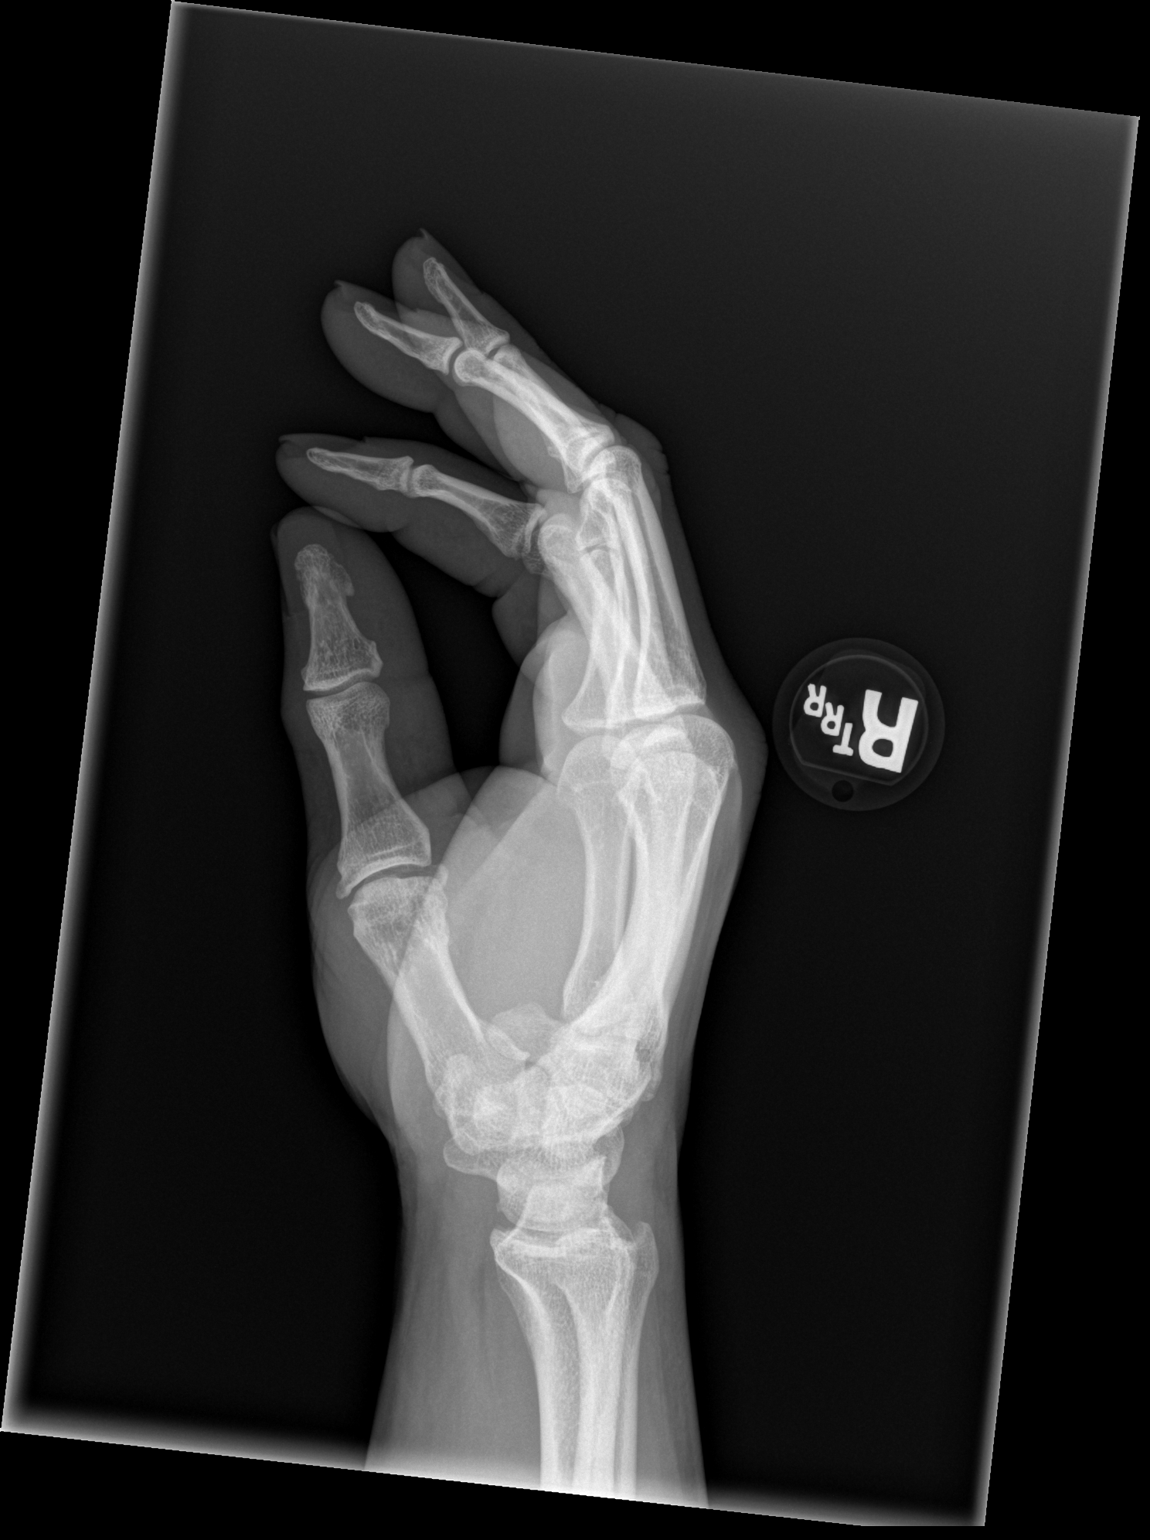

[3 of 3 positions shown; findings below may reference images not displayed]

FINDINGS: There is no evidence of acute fracture or dislocation. There is no
evidence of significant arthropathy. The fifth right finger is
flexed at the DIP joint. Soft tissues are unremarkable.
IMPRESSION: No acute osseous or soft tissue abnormality.

## 2019-07-08 MED ORDER — AMOXICILLIN-POT CLAVULANATE 875-125 MG PO TABS: 1.0000 | ORAL_TABLET | Freq: Two times a day (BID) | ORAL | 0 refills | Status: AC

## 2019-07-08 MED ORDER — AMOXICILLIN-POT CLAVULANATE 875-125 MG PO TABS
1.0000 | ORAL_TABLET | Freq: Once | ORAL | Status: AC
  Administered 2019-07-09: 1 via ORAL
  Filled 2019-07-08: qty 1

## 2019-07-08 NOTE — ED Triage Notes (Signed)
Pt c/o right thumb swelling x2 weeks. Pt reports ingrown nail to the right thumb and swelling and pain over last week to area. No discharge reported. No redness noted.

## 2019-07-08 NOTE — ED Provider Notes (Signed)
[facility] Emergency Department Provider Note  ____________________________________________  Time seen: Approximately 11:55 PM  I have reviewed the triage vital signs and the nursing notes.   HISTORY  Chief Complaint Hand Pain    HPI Edward Owens is a 44 y.o. male that presents to the emergency for evaluation of intermittent pain to the left thumb for 2 weeks.  Patient states that thumb intermittently swells.  He is having pain to the side of his fingernail.  He is not having any pain to the pad of his thumb.  No drainage.  No trauma.   Past Medical History:  Diagnosis Date  . Migraine     Patient Active Problem List   Diagnosis Date Noted  . Migraine     Past Surgical History:  Procedure Laterality Date  . HERNIA REPAIR    . LYMPH NODE DISSECTION Left    groin    Prior to Admission medications   Medication Sig Start Date End Date Taking? Authorizing Provider  amoxicillin-clavulanate (AUGMENTIN) 875-125 MG tablet Take 1 tablet by mouth 2 (two) times daily for 10 days. 07/08/19 07/18/19  Enid Derry, PA-C  sulfamethoxazole-trimethoprim (BACTRIM DS) 800-160 MG tablet Take 1 tablet by mouth 2 (two) times daily. 04/09/19   Enid Derry, PA-C    Allergies Patient has no known allergies.  History reviewed. No pertinent family history.  Social History Social History   Tobacco Use  . Smoking status: Current Every Day Smoker    Packs/day: 0.50    Years: 15.00    Pack years: 7.50    Types: Cigarettes  . Smokeless tobacco: Never Used  Substance Use Topics  . Alcohol use: No  . Drug use: No     Review of Systems  Constitutional: No fever/chills Respiratory: No SOB. Gastrointestinal: No abdominal pain.  No nausea, no vomiting.  Musculoskeletal: Positive for thumb pain. Skin: Negative for rash, abrasions, lacerations, ecchymosis. Neurological: Negative for numbness or  tingling   ____________________________________________   PHYSICAL EXAM:  VITAL SIGNS: ED Triage Vitals [07/08/19 2051]  Enc Vitals Group     BP (!) 142/82     Pulse Rate 83     Resp 18     Temp 98.4 F (36.9 C)     Temp Source Oral     SpO2 98 %     Weight      Height      Head Circumference      Peak Flow      Pain Score      Pain Loc      Pain Edu?      Excl. in GC?      Constitutional: Alert and oriented. Well appearing and in no acute distress. Eyes: Conjunctivae are normal. PERRL. EOMI. Head: Atraumatic. ENT:      Ears:      Nose: No congestion/rhinnorhea.      Mouth/Throat: Mucous membranes are moist.  Neck: No stridor.  Cardiovascular: Normal rate, regular rhythm.  Good peripheral circulation. Respiratory: Normal respiratory effort without tachypnea or retractions. Lungs CTAB. Good air entry to the bases with no decreased or absent breath sounds. Musculoskeletal: Full range of motion to all extremities. No gross deformities appreciated.  Tenderness to palpation to lateral thumb nail.  No erythema.  No tenderness to palpation to pad of thumb.  No drainage. Neurologic:  Normal speech and language. No gross focal neurologic deficits are appreciated.  Skin:  Skin is warm, dry and intact. No rash noted. Psychiatric:  Mood and affect are normal. Speech and behavior are normal. Patient exhibits appropriate insight and judgement.   ____________________________________________   LABS (all labs ordered are listed, but only abnormal results are displayed)  Labs Reviewed - No data to display ____________________________________________  EKG   ____________________________________________  RADIOLOGY Lexine Baton, personally viewed and evaluated these images (plain radiographs) as part of my medical decision making, as well as reviewing the written report by the radiologist.  DG Hand Complete Right  Result Date: 07/08/2019 CLINICAL DATA:  Right thumb swelling  x2 weeks. EXAM: RIGHT HAND - COMPLETE 3+ VIEW COMPARISON:  None. FINDINGS: There is no evidence of acute fracture or dislocation. There is no evidence of significant arthropathy. The fifth right finger is flexed at the DIP joint. Soft tissues are unremarkable. IMPRESSION: No acute osseous or soft tissue abnormality. Electronically Signed   By: Aram Candela M.D.   On: 07/08/2019 21:06    ____________________________________________    PROCEDURES  Procedure(s) performed:    Procedures  INCISION AND DRAINAGE Performed by: Enid Derry Consent: Verbal consent obtained. Risks and benefits: risks, benefits and alternatives were discussed Type: abscess  Body area: thumb  Incision was made with a 23 G needle.  Local anesthetic: None  Drainage: None  Patient tolerance: Patient tolerated the procedure well with no immediate complications.     Medications  amoxicillin-clavulanate (AUGMENTIN) 875-125 MG per tablet 1 tablet (has no administration in time range)     ____________________________________________   INITIAL IMPRESSION / ASSESSMENT AND PLAN / ED COURSE  Pertinent labs & imaging results that were available during my care of the patient were reviewed by me and considered in my medical decision making (see chart for details).  Review of the  CSRS was performed in accordance of the [facility] prior to dispensing any controlled drugs.     Patient presented to emergency department for evaluation of thumb pain for 2 weeks.  Vital signs and exam are reassuring.  I suspect that patient is developing an infection to the side of his thumbnail.  It appeared that there may be a paronychia so drainage was attempted without any purulent drainage.  No indication of felon.  Patient was given a dose of Augmentin in the emergency department for infection.  Patient will be discharged home with prescriptions for Augmentin. Patient is to follow up with hand orthopedics as directed.  Patient is given ED precautions to return to the ED for any worsening or new symptoms.   MILBURN FREENEY was evaluated in Emergency Department on 07/09/2019 for the symptoms described in the history of present illness. He was evaluated in the context of the global COVID-19 pandemic, which necessitated consideration that the patient might be at risk for infection with the SARS-CoV-2 virus that causes COVID-19. Institutional protocols and algorithms that pertain to the evaluation of patients at risk for COVID-19 are in a state of rapid change based on information released by regulatory bodies including the CDC and federal and state organizations. These policies and algorithms were followed during the patient's care in the ED.  ____________________________________________  FINAL CLINICAL IMPRESSION(S) / ED DIAGNOSES  Final diagnoses:  Finger infection      NEW MEDICATIONS STARTED DURING THIS VISIT:  ED Discharge Orders         Ordered    amoxicillin-clavulanate (AUGMENTIN) 875-125 MG tablet  2 times daily     Discontinue  Reprint     07/08/19 2355  This chart was dictated using voice recognition software/Dragon. Despite best efforts to proofread, errors can occur which can change the meaning. Any change was purely unintentional.    Enid Derry, PA-C 07/09/19 0016    Arnaldo Natal, MD 07/09/19 [phone]

## 2019-07-09 ENCOUNTER — Encounter: Payer: Self-pay | Admitting: Emergency Medicine

## 2019-07-30 ENCOUNTER — Encounter: Payer: Self-pay | Admitting: Emergency Medicine

## 2019-07-30 ENCOUNTER — Other Ambulatory Visit: Payer: Self-pay

## 2019-07-30 ENCOUNTER — Emergency Department
Admission: EM | Admit: 2019-07-30 | Discharge: 2019-07-30 | Disposition: A | Discharge status: Home / Self Care | Payer: Self-pay | Attending: Emergency Medicine | Admitting: Emergency Medicine

## 2019-07-30 DIAGNOSIS — F17210 Nicotine dependence, cigarettes, uncomplicated: Secondary | ICD-10-CM | POA: Insufficient documentation

## 2019-07-30 DIAGNOSIS — G43809 Other migraine, not intractable, without status migrainosus: Secondary | ICD-10-CM | POA: Insufficient documentation

## 2019-07-30 MED ORDER — KETOROLAC TROMETHAMINE 30 MG/ML IJ SOLN
30.0000 mg | Freq: Once | INTRAMUSCULAR | Status: AC
  Administered 2019-07-30: 30 mg via INTRAMUSCULAR
  Filled 2019-07-30: qty 1

## 2019-07-30 MED ORDER — ONDANSETRON 4 MG PO TBDP
4.0000 mg | ORAL_TABLET | Freq: Once | ORAL | Status: AC
  Administered 2019-07-30: 4 mg via ORAL
  Filled 2019-07-30: qty 1

## 2019-07-30 MED ORDER — IBUPROFEN 600 MG PO TABS: 600.0000 mg | ORAL_TABLET | Freq: Four times a day (QID) | ORAL | 0 refills | Status: DC | PRN

## 2019-07-30 NOTE — ED Triage Notes (Signed)
Pt presents to ED via POV with c/o migraine x 3 days. Pt A&O x4, ambulatory to triage desk with steady gait. Pt visualized in NAD.

## 2019-07-30 NOTE — ED Provider Notes (Signed)
[facility] Emergency Department Provider Note  ____________________________________________  Time seen: Approximately 10:15 AM  I have reviewed the triage vital signs and the nursing notes.   HISTORY  Chief Complaint Migraine    HPI Edward Owens is a 44 y.o. male that presents to the emergency department for evaluation of migraine for 3 days.  Patient has a history of migraines and this feels the same.  This is not worse than his regular migraines.  Migraine is to the right side of his head.  He has had some photophobia.  He has not had an appetite but does not feel nauseous and has not had any vomiting.  No dizziness.  No trauma.  He is out of his "migraine medication" at home.  He has taken advil without relief. No fevers, neck pain.   Past Medical History:  Diagnosis Date  . Migraine     Patient Active Problem List   Diagnosis Date Noted  . Migraine     Past Surgical History:  Procedure Laterality Date  . HERNIA REPAIR    . LYMPH NODE DISSECTION Left    groin    Prior to Admission medications   Medication Sig Start Date End Date Taking? Authorizing Provider  ibuprofen (ADVIL) 600 MG tablet Take 1 tablet (600 mg total) by mouth every 6 (six) hours as needed. 07/30/19   Enid Derry, PA-C    Allergies Patient has no known allergies.  History reviewed. No pertinent family history.  Social History Social History   Tobacco Use  . Smoking status: Current Every Day Smoker    Packs/day: 0.50    Years: 15.00    Pack years: 7.50    Types: Cigarettes  . Smokeless tobacco: Never Used  Substance Use Topics  . Alcohol use: No  . Drug use: No     Review of Systems  Constitutional: No fever/chills Cardiovascular: No chest pain. Respiratory: No cough. No SOB. Gastrointestinal: No abdominal pain.  No nausea, no vomiting.  Musculoskeletal: Negative for musculoskeletal pain. Skin: Negative for rash, abrasions, lacerations,  ecchymosis. Neurological: Negative for numbness or tingling.  Positive for migraine.   ____________________________________________   PHYSICAL EXAM:  VITAL SIGNS: ED Triage Vitals  Enc Vitals Group     BP 07/30/19 0924 (!) 150/90     Pulse Rate 07/30/19 0924 87     Resp 07/30/19 0924 18     Temp 07/30/19 0924 98.5 F (36.9 C)     Temp Source 07/30/19 0924 Oral     SpO2 07/30/19 0924 98 %     Weight 07/30/19 0921 199 lb 15.3 oz (90.7 kg)     Height 07/30/19 0921 5\' 9"  (1.753 m)     Head Circumference --      Peak Flow --      Pain Score 07/30/19 0921 8     Pain Loc --      Pain Edu? --      Excl. in GC? --      Constitutional: Alert and oriented. Well appearing and in no acute distress. Eyes: Conjunctivae are normal. PERRL. EOMI. Head: Atraumatic. ENT:      Ears:      Nose: No congestion/rhinnorhea.      Mouth/Throat: Mucous membranes are moist.  Neck: No stridor.   Cardiovascular: Normal rate, regular rhythm.  Good peripheral circulation. Respiratory: Normal respiratory effort without tachypnea or retractions. Lungs CTAB. Good air entry to the bases with no decreased or absent breath sounds. Musculoskeletal:  Full range of motion to all extremities. No gross deformities appreciated. Neurologic:  Normal speech and language. No gross focal neurologic deficits are appreciated.  Skin:  Skin is warm, dry and intact. No rash noted. Psychiatric: Mood and affect are normal. Speech and behavior are normal. Patient exhibits appropriate insight and judgement.   ____________________________________________   LABS (all labs ordered are listed, but only abnormal results are displayed)  Labs Reviewed - No data to display ____________________________________________  EKG   ____________________________________________  RADIOLOGY  No results found.  ____________________________________________    PROCEDURES  Procedure(s) performed:    Procedures    Medications   ketorolac (TORADOL) 30 MG/ML injection 30 mg (30 mg Intramuscular Given 07/30/19 1030)  ondansetron (ZOFRAN-ODT) disintegrating tablet 4 mg (4 mg Oral Given 07/30/19 1030)     ____________________________________________   INITIAL IMPRESSION / ASSESSMENT AND PLAN / ED COURSE  Pertinent labs & imaging results that were available during my care of the patient were reviewed by me and considered in my medical decision making (see chart for details).  Review of the [place] CSRS was performed in accordance of the [facility] prior to dispensing any controlled drugs.   Patient's diagnosis is consistent with migraine.  Vital signs and exam are reassuring.  Migraines are consistent with previous migraines so I do not feel that any imaging is warranted at this time.  Patient requested IM medications versus IV medications for pain relief.  Migraine resolved following IM Toradol and he feels much better.  He would like to go home.  Patient will be discharged home with prescriptions for Motrin. Patient is to follow up with primary care as directed. Patient is given ED precautions to return to the ED for any worsening or new symptoms.  Edward Owens was evaluated in Emergency Department on 07/30/2019 for the symptoms described in the history of present illness. He was evaluated in the context of the global COVID-19 pandemic, which necessitated consideration that the patient might be at risk for infection with the SARS-CoV-2 virus that causes COVID-19. Institutional protocols and algorithms that pertain to the evaluation of patients at risk for COVID-19 are in a state of rapid change based on information released by regulatory bodies including the CDC and federal and state organizations. These policies and algorithms were followed during the patient's care in the ED.   ____________________________________________  FINAL CLINICAL IMPRESSION(S) / ED DIAGNOSES  Final diagnoses:  Other migraine without status  migrainosus, not intractable      NEW MEDICATIONS STARTED DURING THIS VISIT:  ED Discharge Orders         Ordered    ibuprofen (ADVIL) 600 MG tablet  Every 6 hours PRN     Discontinue  Reprint     07/30/19 1143              This chart was dictated using voice recognition software/Dragon. Despite best efforts to proofread, errors can occur which can change the meaning. Any change was purely unintentional.    Enid Derry, PA-C 07/30/19 1300    Shaune Pollack, MD 08/01/19 1547

## 2019-07-30 NOTE — ED Notes (Addendum)
See triage note  Presents with h/a for 3 days  Pain  Is to right side of head,behind eyes  States vision is slightly blurred  Hx of migraines in past

## 2020-03-02 ENCOUNTER — Emergency Department
Admission: EM | Admit: 2020-03-02 | Discharge: 2020-03-02 | Disposition: A | Discharge status: Home / Self Care | Payer: Self-pay | Attending: Student in an Organized Health Care Education/Training Program | Admitting: Student in an Organized Health Care Education/Training Program

## 2020-03-02 ENCOUNTER — Other Ambulatory Visit: Payer: Self-pay

## 2020-03-02 ENCOUNTER — Emergency Department: Payer: Self-pay

## 2020-03-02 DIAGNOSIS — F17210 Nicotine dependence, cigarettes, uncomplicated: Secondary | ICD-10-CM | POA: Insufficient documentation

## 2020-03-02 DIAGNOSIS — G43009 Migraine without aura, not intractable, without status migrainosus: Secondary | ICD-10-CM | POA: Insufficient documentation

## 2020-03-02 DIAGNOSIS — H1131 Conjunctival hemorrhage, right eye: Secondary | ICD-10-CM | POA: Insufficient documentation

## 2020-03-02 IMAGING — CT CT HEAD W/O CM
3 series · 16 of 47 positions shown, 19 images · non-contrast
Comparison: [DATE].

CLINICAL DATA: Headache.

EXAM:
CT HEAD WITHOUT CONTRAST
TECHNIQUE: Contiguous axial images were obtained from the base of the skull
through the vertex without intravenous contrast.

[Series 2: head wo · axial · 0.44mm/px · z∈[-24,+106]mm · 10 of 32 slices shown, 13 images]
[im 3/32  brain]
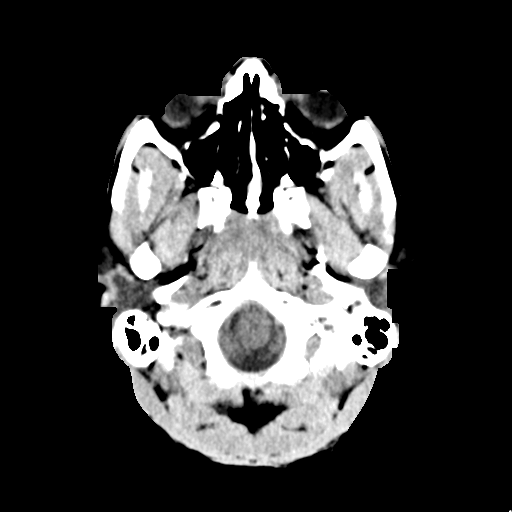
[im 3/32  bone]
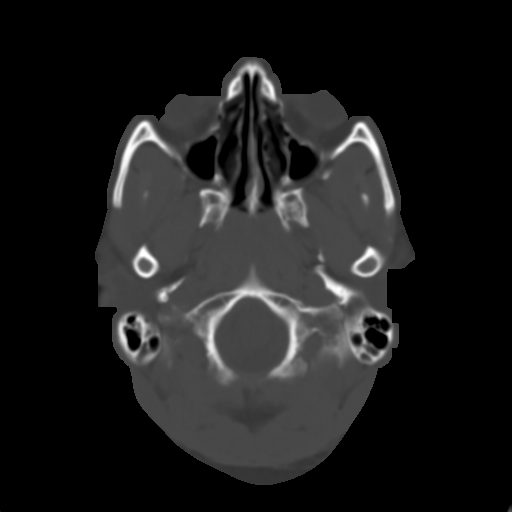
[im 6/32  brain]
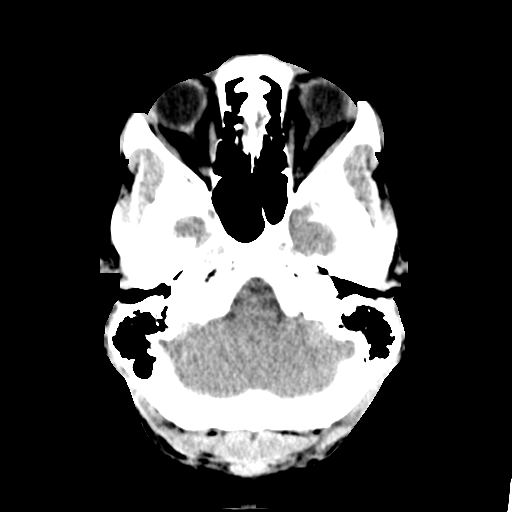
[im 9/32  brain]
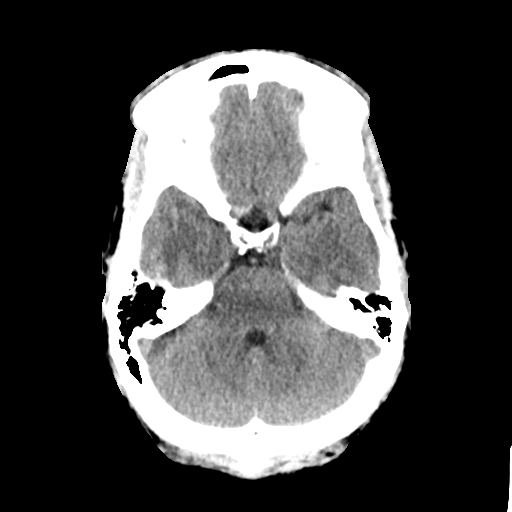
[im 11/32  brain]
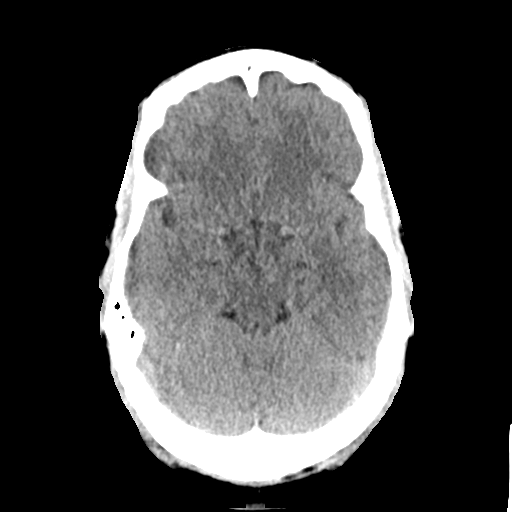
[im 14/32  brain]
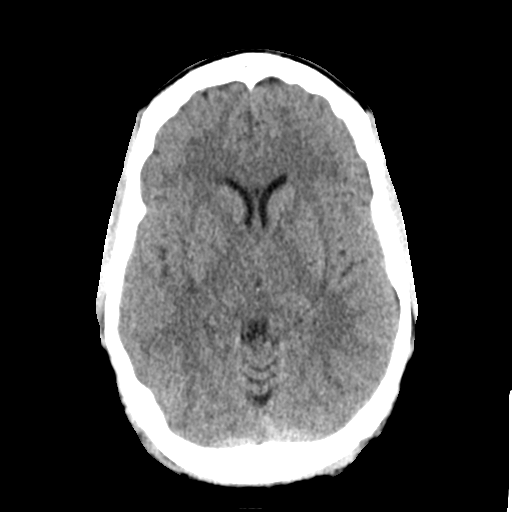
[im 14/32  bone]
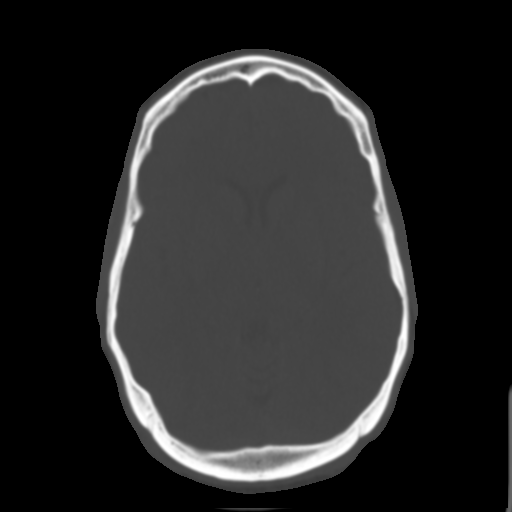
[im 18/32  brain]
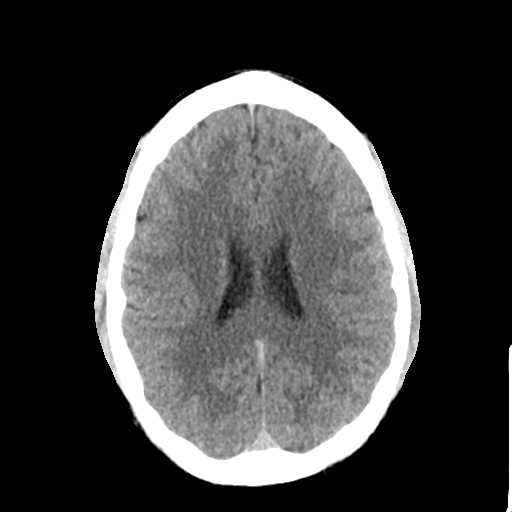
[im 21/32  brain]
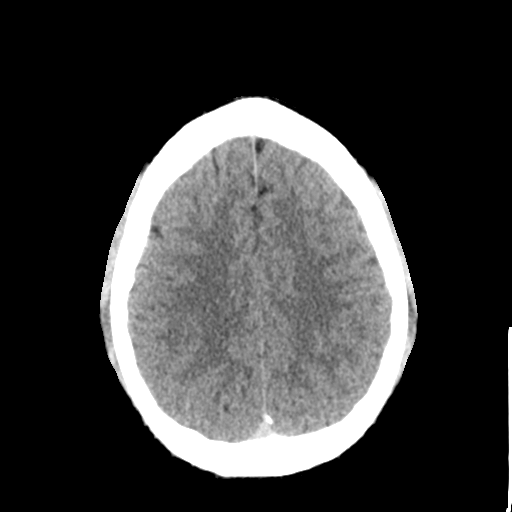
[im 24/32  brain]
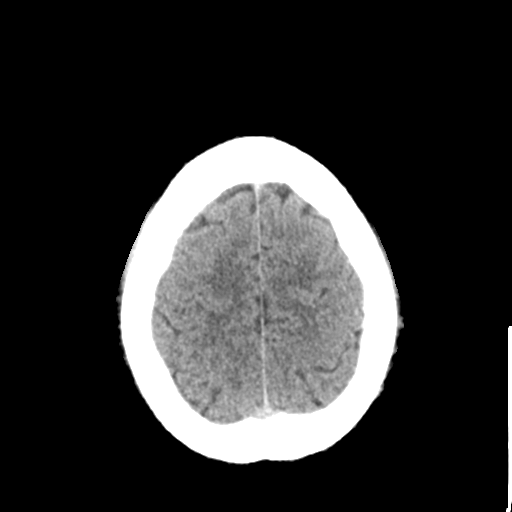
[im 26/32  brain]
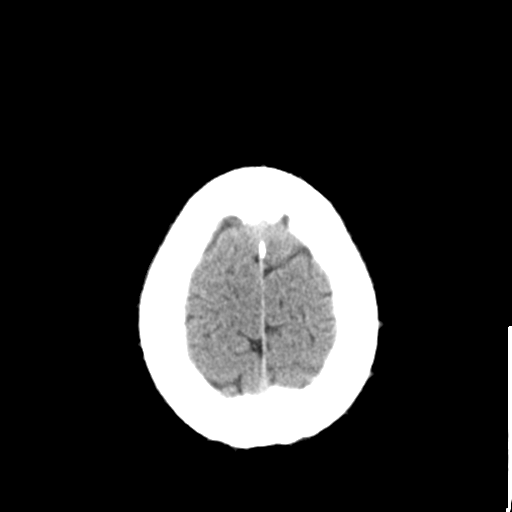
[im 26/32  bone]
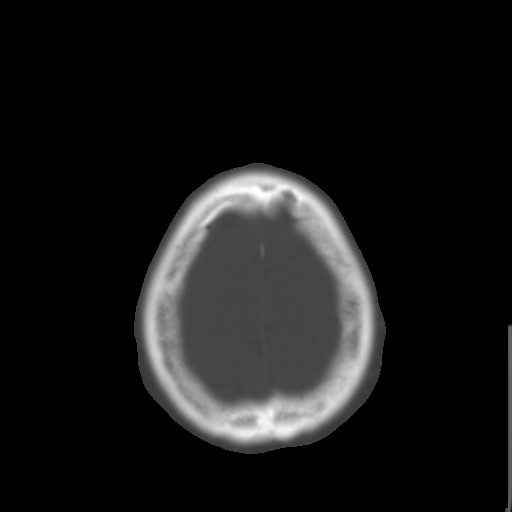
[im 29/32  brain]
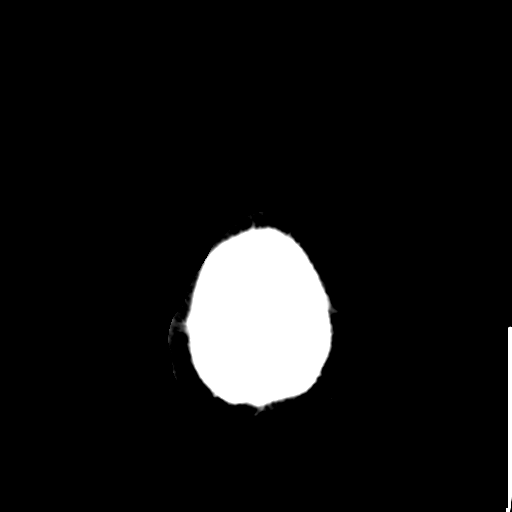

[Series 4: coronal soft tissue · coronal · 0.33mm/px · 3 of 68 slices shown]
[im 23/68  brain]
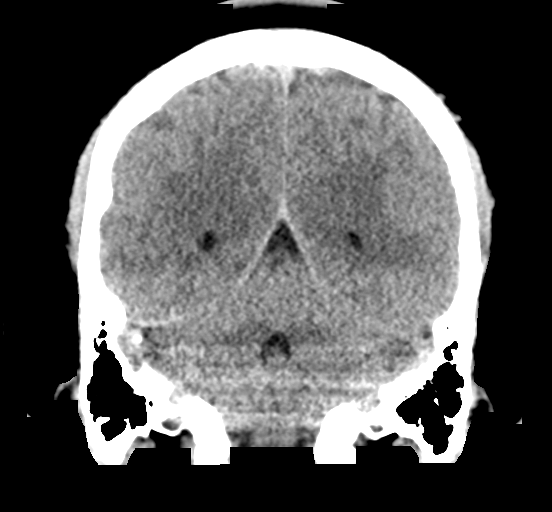
[im 30/68  brain]
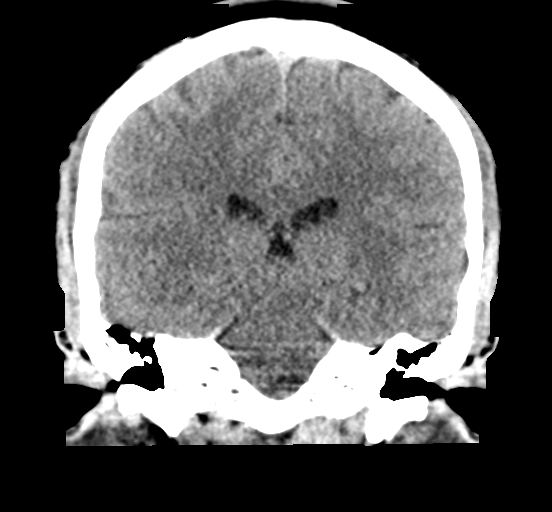
[im 38/68  brain]
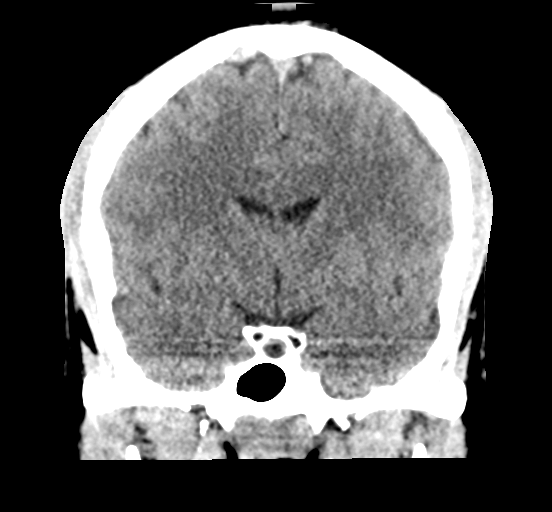

[Series 5: sagittal soft tissue · sagittal · 0.33mm/px · 3 of 57 slices shown]
[im 19/57  brain]
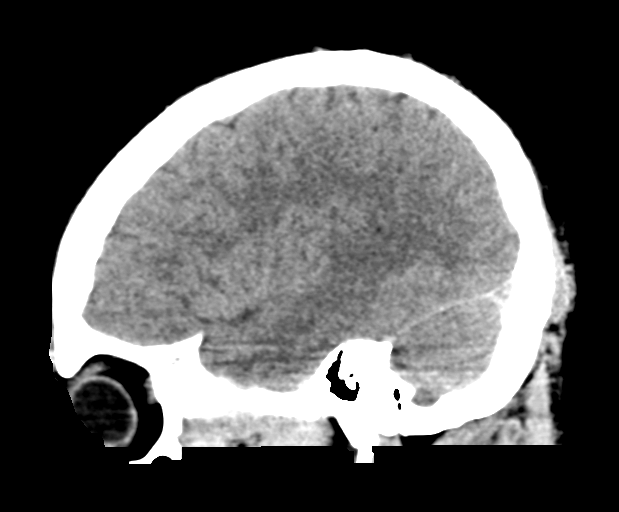
[im 29/57  brain]
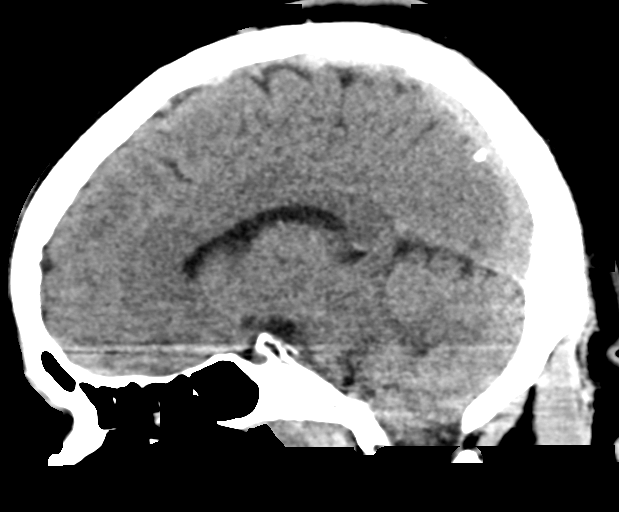
[im 38/57  brain]
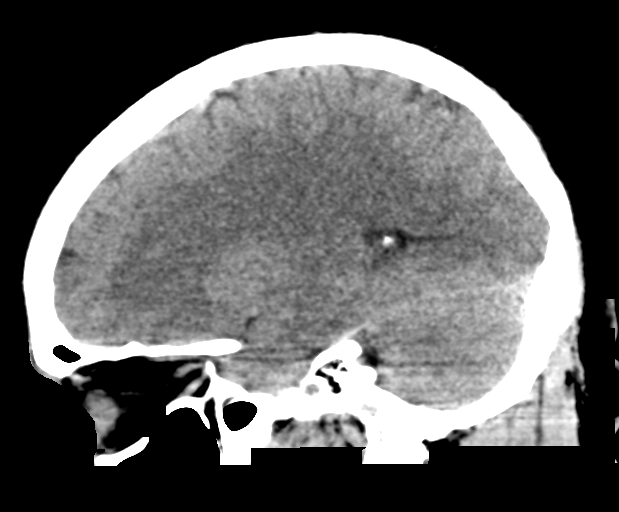

[16 of 47 positions shown; findings below may reference images not displayed]

FINDINGS: Brain: No evidence of acute infarction, hemorrhage, hydrocephalus,
extra-axial collection or mass lesion/mass effect.

Vascular: No hyperdense vessel or unexpected calcification.

Skull: Normal. Negative for fracture or focal lesion.

Sinuses/Orbits: No acute finding.

Other: None.
IMPRESSION: Normal head CT.

## 2020-03-02 MED ORDER — ONDANSETRON HCL 4 MG/2ML IJ SOLN
4.0000 mg | Freq: Once | INTRAMUSCULAR | Status: AC
  Administered 2020-03-02: 4 mg via INTRAVENOUS
  Filled 2020-03-02: qty 2

## 2020-03-02 MED ORDER — METOCLOPRAMIDE HCL 5 MG/ML IJ SOLN
20.0000 mg | Freq: Once | INTRAVENOUS | Status: AC
  Administered 2020-03-02: 20 mg via INTRAVENOUS
  Filled 2020-03-02: qty 4

## 2020-03-02 MED ORDER — SODIUM CHLORIDE 0.9 % IV BOLUS
1000.0000 mL | Freq: Once | INTRAVENOUS | Status: AC
  Administered 2020-03-02: 1000 mL via INTRAVENOUS

## 2020-03-02 MED ORDER — KETOROLAC TROMETHAMINE 30 MG/ML IJ SOLN
30.0000 mg | Freq: Once | INTRAMUSCULAR | Status: AC
  Administered 2020-03-02: 30 mg via INTRAVENOUS
  Filled 2020-03-02: qty 1

## 2020-03-02 NOTE — ED Provider Notes (Signed)
[facility] Emergency Department Provider Note   ____________________________________________   Event Date/Time   First MD Initiated Contact with Patient 03/02/20 0732     (approximate)  I have reviewed the triage vital signs and the nursing notes.   HISTORY  Chief Complaint Migraine    HPI Edward Owens is a 45 y.o. male patient presents with several days of migraine headache.  Patient is similar to his previous migraine headaches.  Patient was concerned because he noticed bleeding into the right eye which occurred overnight.  Patient state last migraine headache was approximately 7 or 8 months ago.  Patient stated there is nausea and photophobia associated with complaint.  Patient denies vertigo or weakness.  Rates his pain as a 9/10.  Described pain as "achy".  No recent palliative measure for complaint.         Past Medical History:  Diagnosis Date  . Migraine     Patient Active Problem List   Diagnosis Date Noted  . Migraine     Past Surgical History:  Procedure Laterality Date  . HERNIA REPAIR    . LYMPH NODE DISSECTION Left    groin    Prior to Admission medications   Medication Sig Start Date End Date Taking? Authorizing Provider  ibuprofen (ADVIL) 600 MG tablet Take 1 tablet (600 mg total) by mouth every 6 (six) hours as needed. 07/30/19   Enid Derry, PA-C    Allergies Patient has no known allergies.  No family history on file.  Social History Social History   Tobacco Use  . Smoking status: Current Every Day Smoker    Packs/day: 0.50    Years: 15.00    Pack years: 7.50    Types: Cigarettes  . Smokeless tobacco: Never Used  Substance Use Topics  . Alcohol use: No  . Drug use: No    Review of Systems Constitutional: No fever/chills Eyes: No visual changes.  Bleeding right eye. ENT: No sore throat. Cardiovascular: Denies chest pain. Respiratory: Denies shortness of breath. Gastrointestinal: No abdominal  pain.  Nausea without vomiting.  No diarrhea.  No constipation. Genitourinary: Negative for dysuria. Musculoskeletal: Negative for back pain. Skin: Negative for rash. Neurological: Positive for headaches, denies focal weakness or numbness.  ____________________________________________   PHYSICAL EXAM:  VITAL SIGNS: ED Triage Vitals [03/02/20 0715]  Enc Vitals Group     BP (!) 141/85     Pulse Rate 80     Resp 18     Temp 98 F (36.7 C)     Temp src      SpO2 100 %     Weight 205 lb (93 kg)     Height 5\' 8"  (1.727 m)     Head Circumference      Peak Flow      Pain Score 9     Pain Loc      Pain Edu?      Excl. in GC?    Constitutional: Alert and oriented. Well appearing and in no acute distress. Eyes: Exam hindered by photophobic condition.  Right subconjunctival hemorrhaging.  EOMI. Head: Atraumatic. Cardiovascular: Normal rate, regular rhythm. Grossly normal heart sounds.  Good peripheral circulation. Respiratory: Normal respiratory effort.  No retractions. Lungs CTAB. Gastrointestinal: Soft and nontender. No distention. No abdominal bruits. No CVA tenderness. Neurologic:  Normal speech and language. No gross focal neurologic deficits are appreciated. No gait instability. Skin:  Skin is warm, dry and intact. No rash noted. Psychiatric: Mood and  affect are normal. Speech and behavior are normal.  ____________________________________________   LABS (all labs ordered are listed, but only abnormal results are displayed)  Labs Reviewed - No data to display ____________________________________________  EKG   ____________________________________________  RADIOLOGY I, Joni Reining, personally viewed and evaluated these images (plain radiographs) as part of my medical decision making, as well as reviewing the written report by the radiologist.  ED MD interpretation:    Official radiology report(s): CT Head Wo Contrast  Result Date: 03/02/2020 CLINICAL DATA:   Headache. EXAM: CT HEAD WITHOUT CONTRAST TECHNIQUE: Contiguous axial images were obtained from the base of the skull through the vertex without intravenous contrast. COMPARISON:  June 18, 2015. FINDINGS: Brain: No evidence of acute infarction, hemorrhage, hydrocephalus, extra-axial collection or mass lesion/mass effect. Vascular: No hyperdense vessel or unexpected calcification. Skull: Normal. Negative for fracture or focal lesion. Sinuses/Orbits: No acute finding. Other: None. IMPRESSION: Normal head CT. Electronically Signed   By: Lupita Raider M.D.   On: 03/02/2020 08:03    ____________________________________________   PROCEDURES  Procedure(s) performed (including Critical Care):  Procedures   ____________________________________________   INITIAL IMPRESSION / ASSESSMENT AND PLAN / ED COURSE  As part of my medical decision making, I reviewed the following data within the electronic MEDICAL RECORD NUMBER         Patient presents with migraine headache.  Patient states similar to previous headaches but of concern because of  bleeding in the right eye.  Discussed no acute findings on his head CT.  Patient responded well to IV hydration, Toradol, and Reglan.      ____________________________________________   FINAL CLINICAL IMPRESSION(S) / ED DIAGNOSES  Final diagnoses:  Migraine without aura and without status migrainosus, not intractable  Subconjunctival hemorrhage of right eye     ED Discharge Orders    None      *Please note:  Edward Owens was evaluated in Emergency Department on 03/02/2020 for the symptoms described in the history of present illness. He was evaluated in the context of the global COVID-19 pandemic, which necessitated consideration that the patient might be at risk for infection with the SARS-CoV-2 virus that causes COVID-19. Institutional protocols and algorithms that pertain to the evaluation of patients at risk for COVID-19 are in a state of rapid  change based on information released by regulatory bodies including the CDC and federal and state organizations. These policies and algorithms were followed during the patient's care in the ED.  Some ED evaluations and interventions may be delayed as a result of limited staffing during and the pandemic.*   Note:  This document was prepared using Dragon voice recognition software and may include unintentional dictation errors.    Joni Reining, PA-C 03/02/20 1308    Willy Eddy, MD 03/02/20 1515

## 2020-03-02 NOTE — ED Triage Notes (Signed)
Pt comes via POV from home with c/o migraine for few days now. Pt states it hurts more on the right side and dow his neck.  Pt states some redness to his eye. Pt states he has taken meds in past for this but doctor moved offices and he hasn't been on anything.

## 2020-03-02 NOTE — ED Notes (Addendum)
Pt co of migraine that has lasted two weeks. He has tried multiple medicines for it and it doesn't work. Pt says that he is unable to sleep. Denies N/V. Right eye appears slightly swollen.

## 2020-10-24 ENCOUNTER — Encounter: Payer: Self-pay | Admitting: Emergency Medicine

## 2020-10-24 ENCOUNTER — Other Ambulatory Visit: Payer: Self-pay

## 2020-10-24 ENCOUNTER — Emergency Department: Payer: Self-pay

## 2020-10-24 DIAGNOSIS — Z5321 Procedure and treatment not carried out due to patient leaving prior to being seen by health care provider: Secondary | ICD-10-CM | POA: Insufficient documentation

## 2020-10-24 DIAGNOSIS — M79674 Pain in right toe(s): Secondary | ICD-10-CM | POA: Insufficient documentation

## 2020-10-24 IMAGING — CR DG TOE 5TH 2+V*R*
1 series · 4 of 4 positions shown · non-contrast
Comparison: None.

CLINICAL DATA: Toe injury dark in color

EXAM:
RIGHT FIFTH TOE

[Series 4: x toes ap right · 0.14mm/px · 4 of 4 slices shown]
[im 1/4]
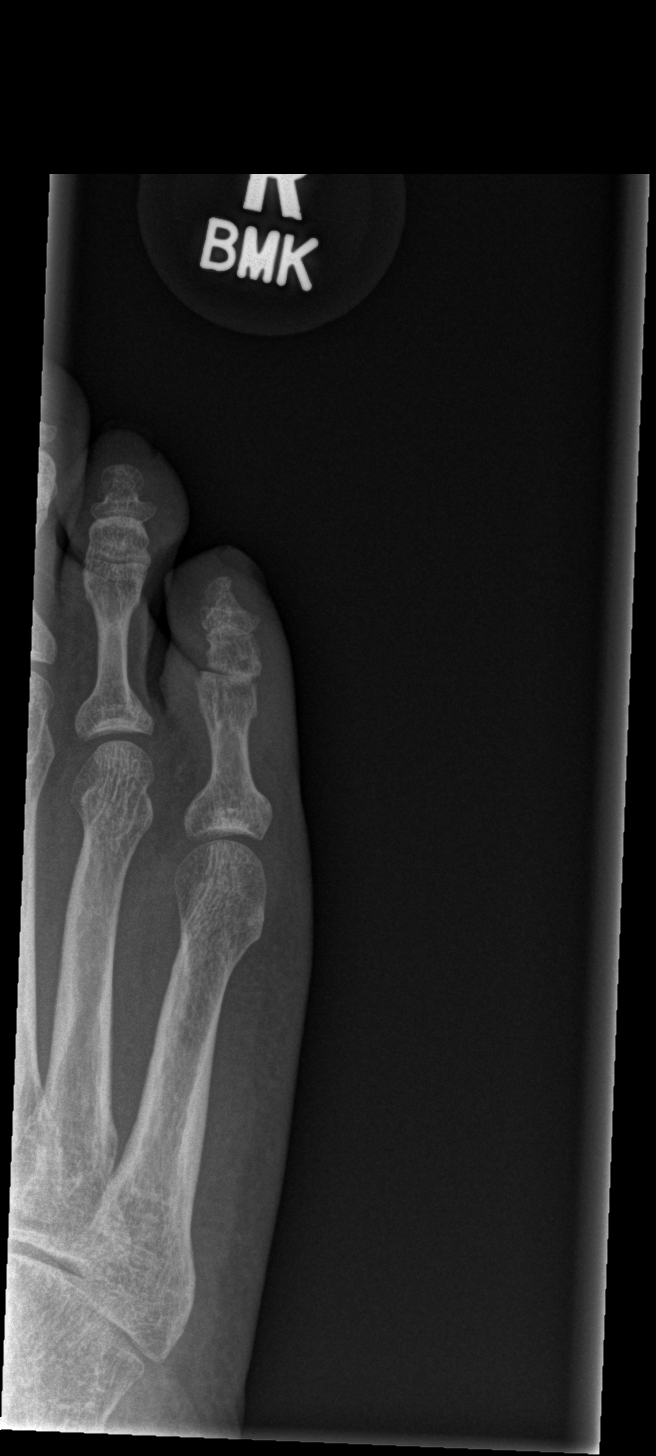
[im 2/4]
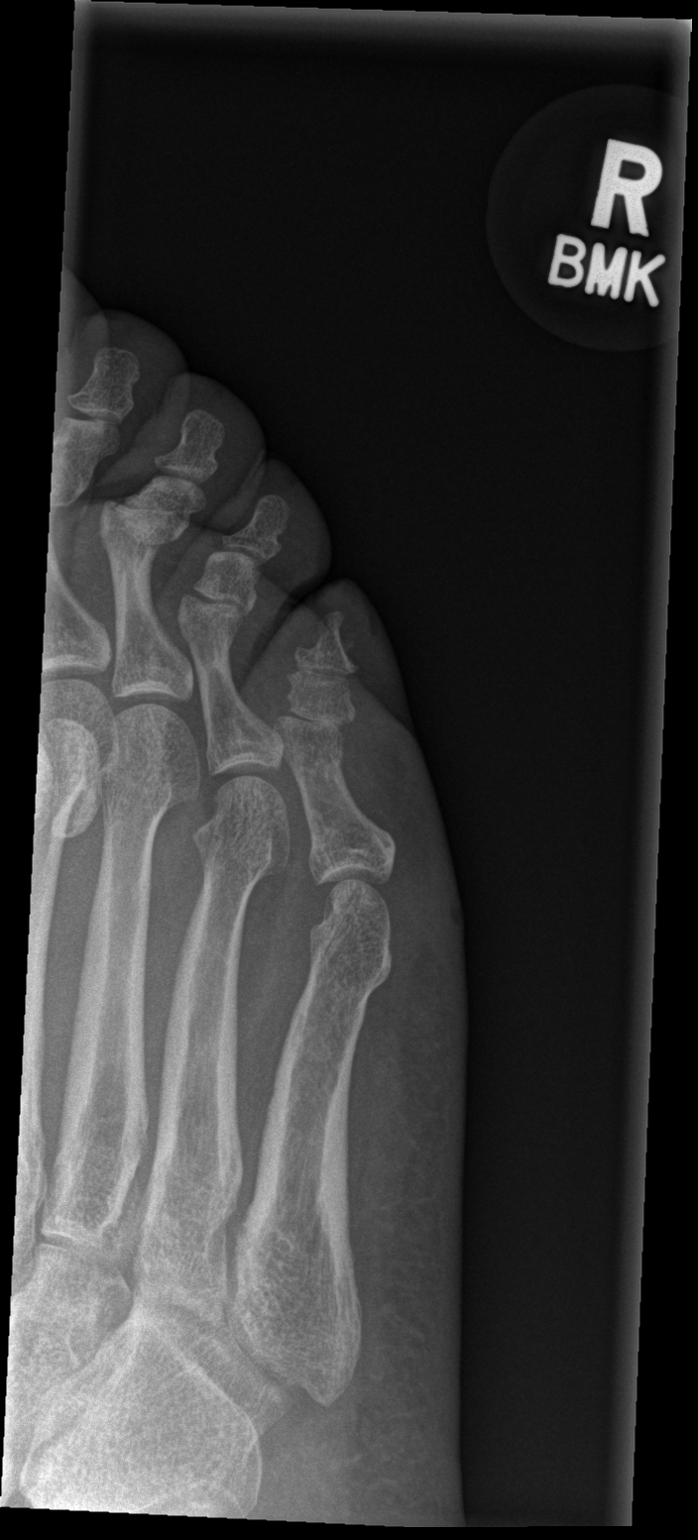
[im 3/4]
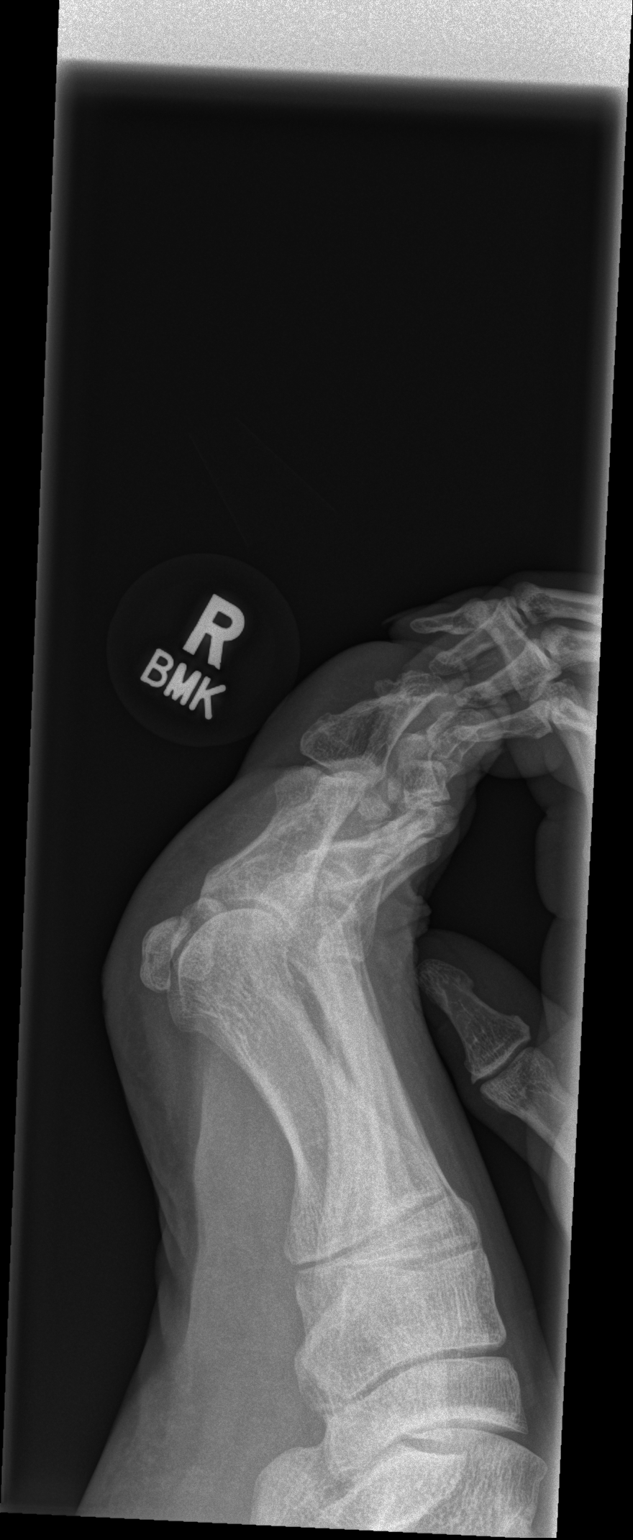
[im 4/4]
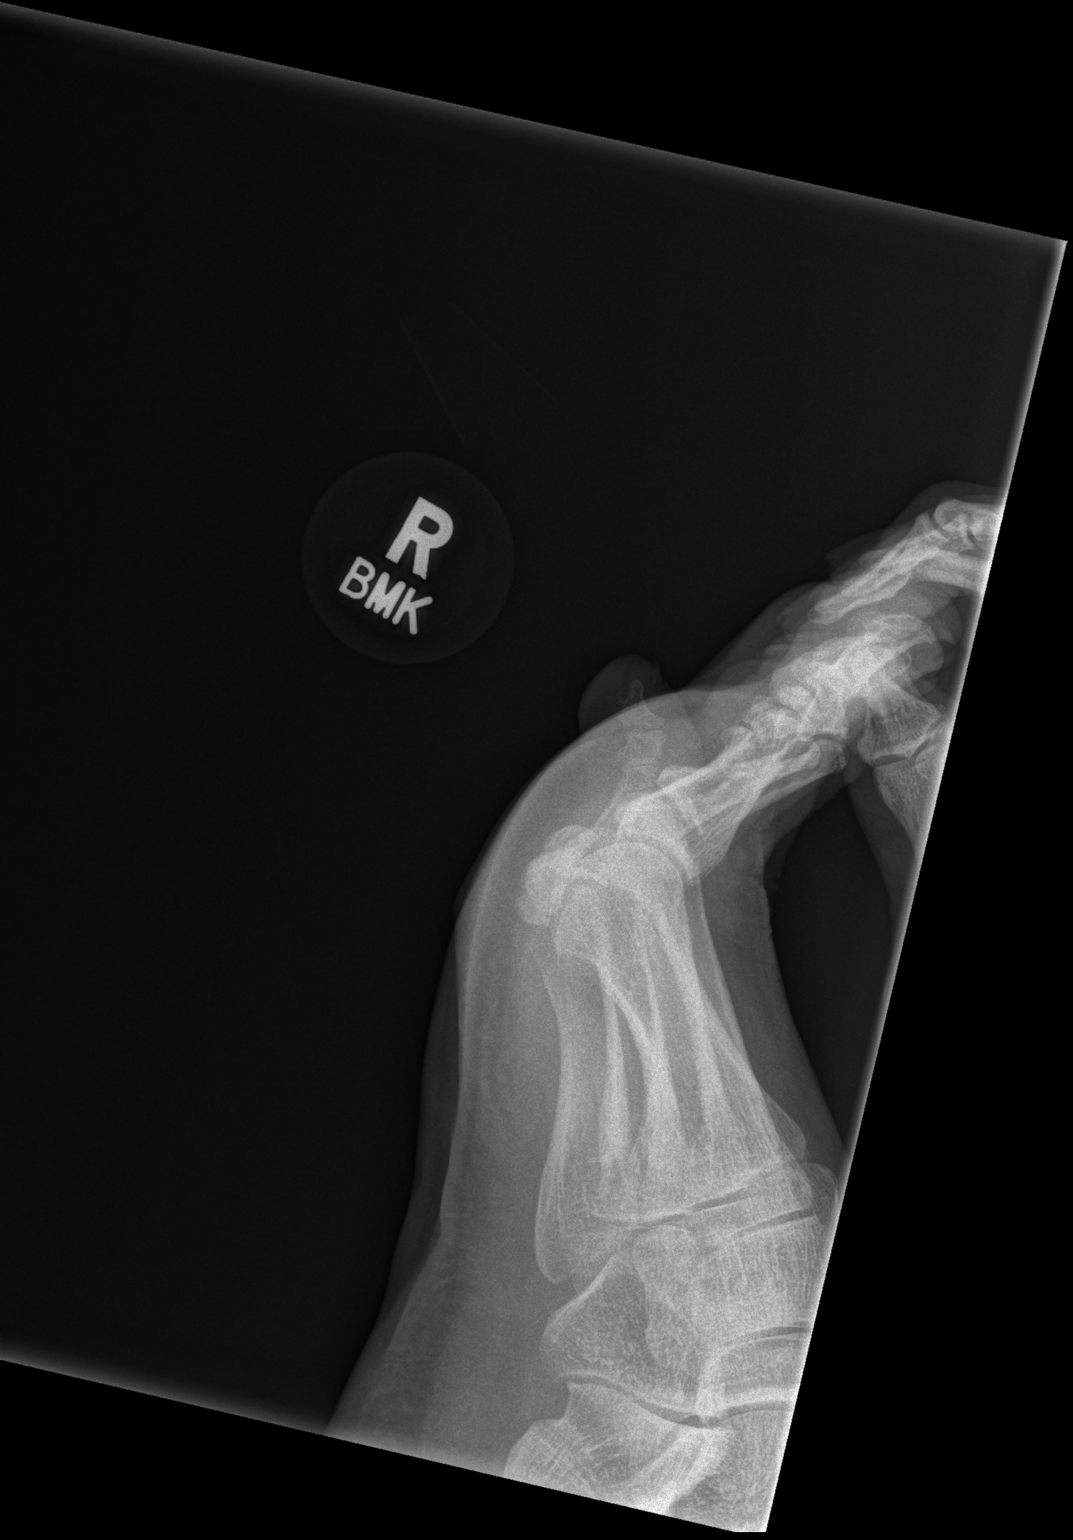

[4 of 4 positions shown; findings below may reference images not displayed]

FINDINGS: No definitive fracture is seen. Soft tissue swelling without
emphysema. Questionable small erosion on the lateral side at the
fifth PIP joint. Question resorptive change at the tuft of the fifth
distal phalanx.
IMPRESSION: 1. Possible mild resorptive change at the fifth distal phalanx tuft
with possible subtle erosion at fifth IP joint, osteomyelitis cannot
be excluded. Consider correlation with MRI.

## 2020-10-24 NOTE — ED Triage Notes (Addendum)
Pt triaged by Lennart Pall RN earlier (notes entered erroneously under wrong pt, re-entered by this nurse; acuity level changed and orders entered)' pt with rt pinkie toe and foul odor for "awhile"; toe dark in color, no open wound, no hx diabetes

## 2020-10-25 ENCOUNTER — Emergency Department: Payer: Self-pay

## 2020-10-25 ENCOUNTER — Emergency Department
Admission: EM | Admit: 2020-10-25 | Discharge: 2020-10-25 | Discharge status: Against Medical Advice | Payer: Self-pay | Attending: Emergency Medicine | Admitting: Emergency Medicine

## 2020-10-25 ENCOUNTER — Other Ambulatory Visit: Payer: Self-pay

## 2020-10-25 ENCOUNTER — Emergency Department
Admission: EM | Admit: 2020-10-25 | Discharge: 2020-10-25 | Disposition: A | Discharge status: Home / Self Care | Payer: Self-pay | Attending: Emergency Medicine | Admitting: Emergency Medicine

## 2020-10-25 DIAGNOSIS — L089 Local infection of the skin and subcutaneous tissue, unspecified: Secondary | ICD-10-CM | POA: Insufficient documentation

## 2020-10-25 DIAGNOSIS — F17210 Nicotine dependence, cigarettes, uncomplicated: Secondary | ICD-10-CM | POA: Insufficient documentation

## 2020-10-25 DIAGNOSIS — M869 Osteomyelitis, unspecified: Secondary | ICD-10-CM

## 2020-10-25 DIAGNOSIS — M79674 Pain in right toe(s): Secondary | ICD-10-CM | POA: Insufficient documentation

## 2020-10-25 LAB — CBC WITH DIFFERENTIAL/PLATELET
Abs Immature Granulocytes: 0.05 10*3/uL (ref 0.00–0.07)
Basophils Absolute: 0 10*3/uL (ref 0.0–0.1)
Basophils Relative: 0 %
Eosinophils Absolute: 0.2 10*3/uL (ref 0.0–0.5)
Eosinophils Relative: 1 %
HCT: 41.8 % (ref 39.0–52.0)
Hemoglobin: 14.6 g/dL (ref 13.0–17.0)
Immature Granulocytes: 0 %
Lymphocytes Relative: 23 %
Lymphs Abs: 3.4 10*3/uL (ref 0.7–4.0)
MCH: 32.7 pg (ref 26.0–34.0)
MCHC: 34.9 g/dL (ref 30.0–36.0)
MCV: 93.7 fL (ref 80.0–100.0)
Monocytes Absolute: 0.9 10*3/uL (ref 0.1–1.0)
Monocytes Relative: 6 %
Neutro Abs: 10.1 10*3/uL — ABNORMAL HIGH (ref 1.7–7.7)
Neutrophils Relative %: 70 %
Platelets: 194 10*3/uL (ref 150–400)
RBC: 4.46 MIL/uL (ref 4.22–5.81)
RDW: 14.4 % (ref 11.5–15.5)
Smear Review: NORMAL
WBC: 14.6 10*3/uL — ABNORMAL HIGH (ref 4.0–10.5)
nRBC: 0 % (ref 0.0–0.2)

## 2020-10-25 LAB — COMPREHENSIVE METABOLIC PANEL
ALT: 27 U/L (ref 0–44)
AST: 23 U/L (ref 15–41)
Albumin: 4.3 g/dL (ref 3.5–5.0)
Alkaline Phosphatase: 72 U/L (ref 38–126)
Anion gap: 7 (ref 5–15)
BUN: 16 mg/dL (ref 6–20)
CO2: 24 mmol/L (ref 22–32)
Calcium: 9.6 mg/dL (ref 8.9–10.3)
Chloride: 107 mmol/L (ref 98–111)
Creatinine, Ser: 1.09 mg/dL (ref 0.61–1.24)
GFR, Estimated: >60 mL/min (ref >60)
Glucose, Bld: 102 mg/dL — ABNORMAL HIGH (ref 70–99)
Potassium: 3.9 mmol/L (ref 3.5–5.1)
Sodium: 138 mmol/L (ref 135–145)
Total Bilirubin: 0.7 mg/dL (ref 0.3–1.2)
Total Protein: 7.9 g/dL (ref 6.5–8.1)

## 2020-10-25 LAB — LACTIC ACID, PLASMA
Lactic Acid, Venous: 0.8 mmol/L (ref 0.5–1.9)
Lactic Acid, Venous: 0.7 mmol/L (ref 0.5–1.9)

## 2020-10-25 IMAGING — MR MR FOOT*R* WO/W CM
9 series · 40 of 40 positions shown · IV contrast (10ml Gadavist)
Comparison: Radiograph dated [DATE]

CLINICAL DATA: Abnormal radiograph, suspected osteomyelitis

EXAM:
MRI OF THE RIGHT FOREFOOT WITHOUT AND WITH CONTRAST
TECHNIQUE: Multiplanar, multisequence MR imaging of the foot was performed
before and after the administration of intravenous contrast.
CONTRAST:  10mL GADAVIST GADOBUTROL 1 MMOL/ML IV SOLN

[Series 4: T1 · coronal · right · 3.0mm · 0.38mm/px · 6 of 45 slices shown (1 of 2)]
[im 1/45]
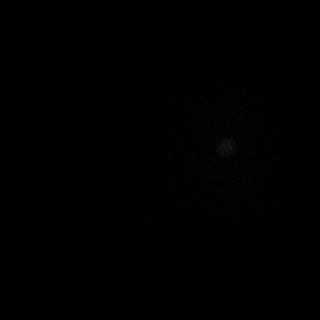
[im 9/45]
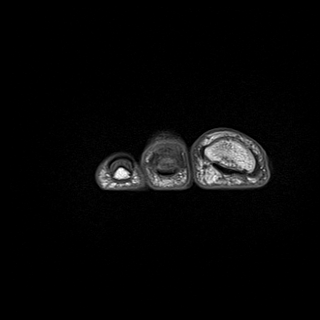
[im 18/45]
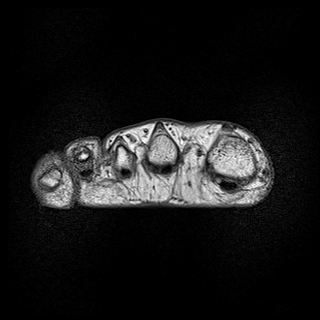
[im 27/45]
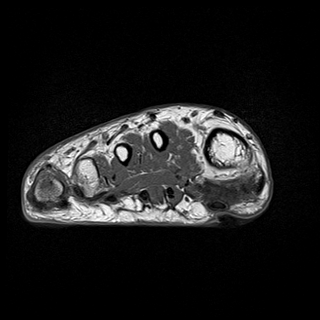
[im 36/45]
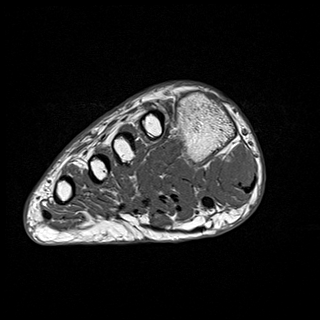
[im 45/45]
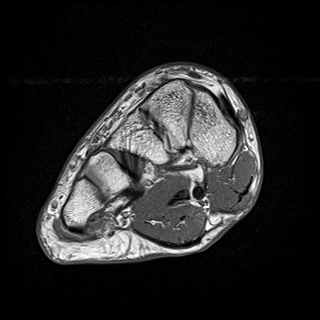

[Series 6: T2 · coronal · right · 3.0mm · 0.38mm/px · 6 of 45 slices shown (1 of 2)]
[im 1/45]
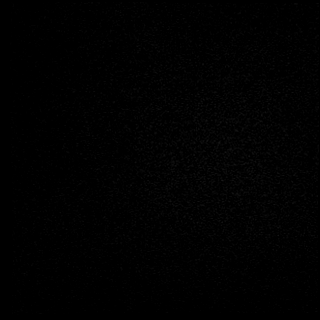
[im 9/45]
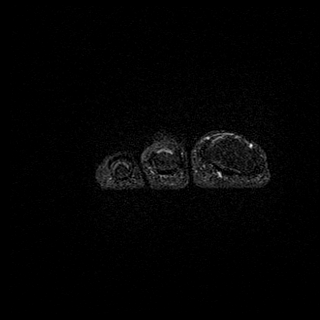
[im 18/45]
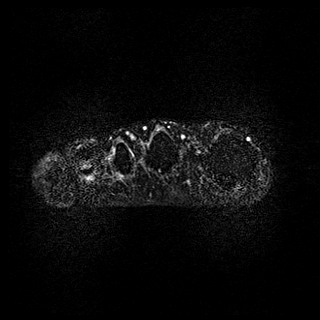
[im 27/45]
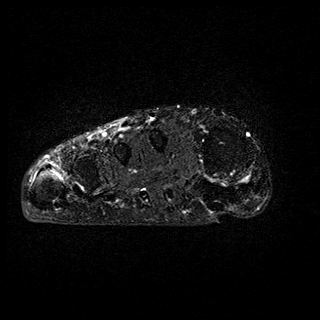
[im 36/45]
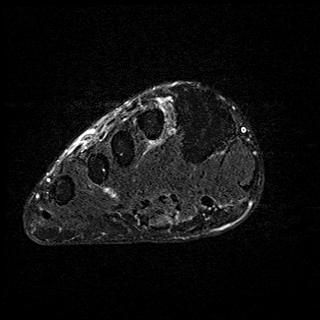
[im 45/45]
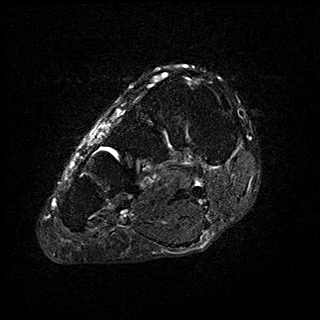

[Series 7: T1 · axial · right · 3.0mm · 0.70mm/px · z∈[-135,-62]mm · 3 of 20 slices shown (2 of 2)]
[im 1/20]
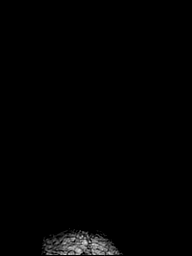
[im 10/20]
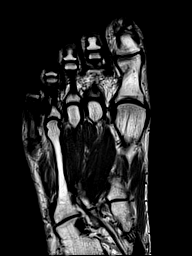
[im 20/20]
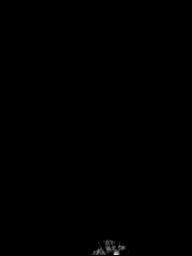

[Series 9: T2 · axial · right · 3.0mm · 0.70mm/px · z∈[-135,-62]mm · 3 of 20 slices shown (2 of 2)]
[im 1/20]
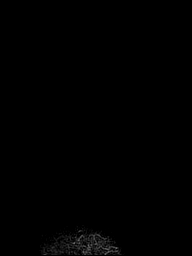
[im 10/20]
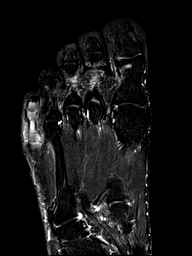
[im 20/20]
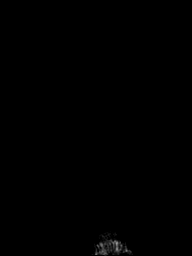

[Series 10: STIR · sagittal · right · 3.0mm · 0.62mm/px · 4 of 29 slices shown]
[im 1/29]
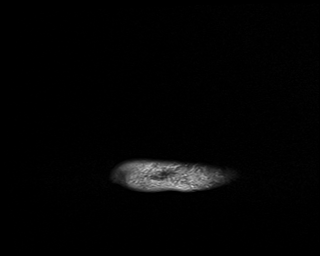
[im 10/29]
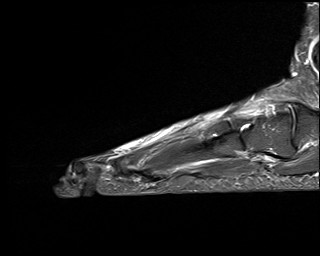
[im 19/29]
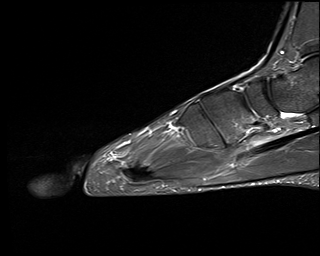
[im 29/29]
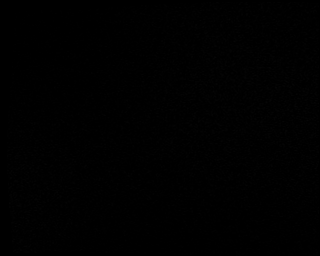

[Series 11: T1 fat-sat · coronal · non-contrast · right · 3.0mm · 0.47mm/px · 6 of 45 slices shown (1 of 3)]
[im 1/45]
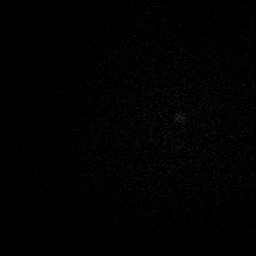
[im 9/45]
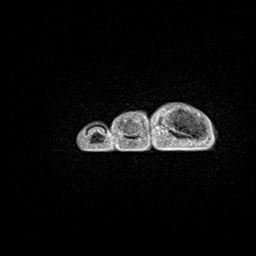
[im 18/45]
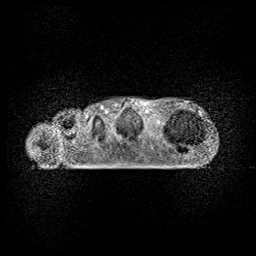
[im 27/45]
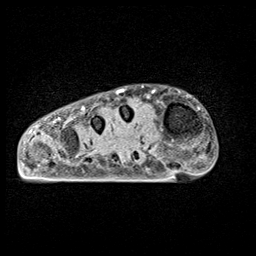
[im 36/45]
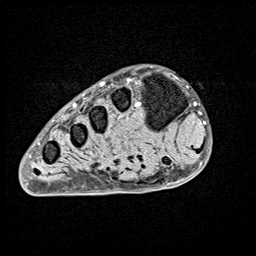
[im 45/45]
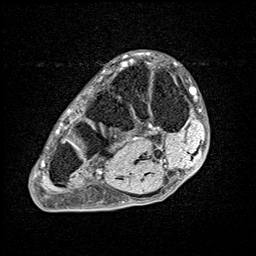

[Series 12: T1 fat-sat post-contrast · coronal · right · 3.0mm · 0.47mm/px · 6 of 45 slices shown]
[im 1/45]
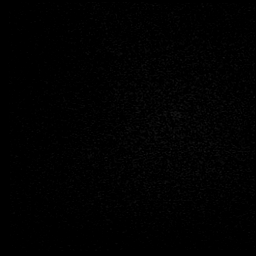
[im 9/45]
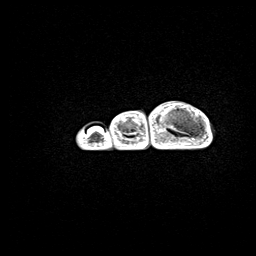
[im 18/45]
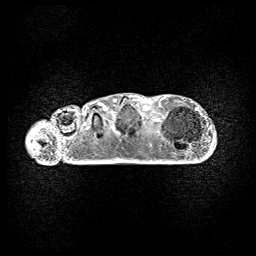
[im 27/45]
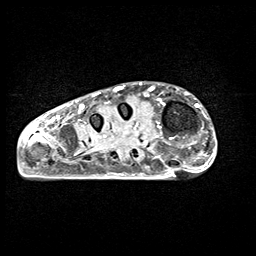
[im 36/45]
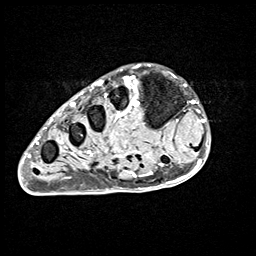
[im 45/45]
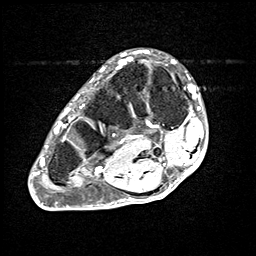

[Series 13: T1 fat-sat · sagittal · right · 3.0mm · 0.62mm/px · 3 of 23 slices shown (2 of 3)]
[im 1/23]
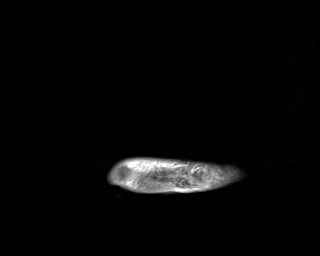
[im 12/23]
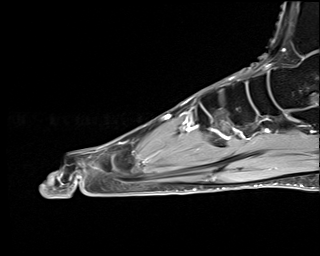
[im 23/23]
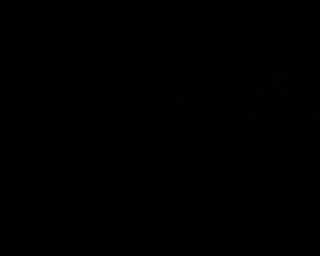

[Series 14: T1 fat-sat · axial · right · 3.0mm · 0.56mm/px · z∈[-135,-62]mm · 3 of 19 slices shown (3 of 3)]
[im 1/19]
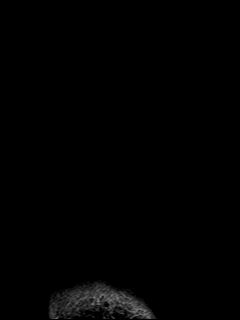
[im 10/19]
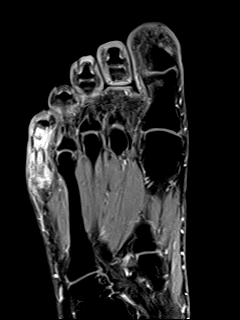
[im 19/19]
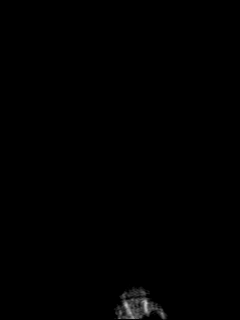

[40 of 40 positions shown; findings below may reference images not displayed]

FINDINGS: Bones/Joint/Cartilage

Bone marrow edema with diffuse enhancement on post-contrast in the
proximal and mid phalanx of the fifth digit.

Ligaments

Intact

Muscles and Tendons

Plantar muscles are normal in size and signal. Visualized tendons
are intact

Soft tissues

Subcutaneous soft tissue edema prominent about the fifth digit
IMPRESSION: Abnormal marrow signal in the proximal and middle phalanges of the
fifth digit. In the presence of adjacent skin wound findings are
suspicious for acute osteomyelitis. Differential includes
nondisplaced fracture, correlate with recent history of trauma.

Prominent subcutaneous soft tissue edema about the fifth digit

## 2020-10-25 MED ORDER — SODIUM CHLORIDE 0.9 % IV SOLN
2.0000 g | Freq: Once | INTRAVENOUS | Status: AC
  Administered 2020-10-25: 2 g via INTRAVENOUS
  Filled 2020-10-25: qty 2

## 2020-10-25 MED ORDER — GADOBUTROL 1 MMOL/ML IV SOLN
10.0000 mL | Freq: Once | INTRAVENOUS | Status: AC | PRN
  Administered 2020-10-25: 10 mL via INTRAVENOUS
  Filled 2020-10-25: qty 10

## 2020-10-25 MED ORDER — IBUPROFEN 400 MG PO TABS
400.0000 mg | ORAL_TABLET | Freq: Once | ORAL | Status: AC
  Administered 2020-10-25: 400 mg via ORAL
  Filled 2020-10-25: qty 1

## 2020-10-25 MED ORDER — DOXYCYCLINE HYCLATE 100 MG PO CAPS: 100.0000 mg | ORAL_CAPSULE | Freq: Two times a day (BID) | ORAL | 0 refills | Status: AC

## 2020-10-25 MED ORDER — VANCOMYCIN HCL 2000 MG/400ML IV SOLN
2000.0000 mg | Freq: Once | INTRAVENOUS | Status: AC
  Administered 2020-10-25: 2000 mg via INTRAVENOUS
  Filled 2020-10-25: qty 400

## 2020-10-25 MED ORDER — ACETAMINOPHEN 500 MG PO TABS
1000.0000 mg | ORAL_TABLET | Freq: Once | ORAL | Status: AC
  Administered 2020-10-25: 1000 mg via ORAL
  Filled 2020-10-25: qty 2

## 2020-10-25 NOTE — Consult Note (Signed)
PHARMACY -  BRIEF ANTIBIOTIC NOTE   Pharmacy has received consult(s) for cefepime and vancomycin from an ED provider.  The patient's profile has been reviewed for ht/wt/allergies/indication/available labs.    One time order(s) placed for  Vancomycin 2 gram Cefepime 2 gram   Further antibiotics/pharmacy consults should be ordered by admitting physician if indicated.                       Thank you, Sharen Hones, PharmD, BCPS Clinical Pharmacist   10/25/2020  12:30 PM

## 2020-10-25 NOTE — ED Triage Notes (Signed)
Pt to ED for for discoloration to right foot pinkie toe for a few months. No hx diabetes. Xray performed yesterday, LWBS d/t wait.

## 2020-10-25 NOTE — ED Notes (Addendum)
Patient returns from MRI, states he is ready to go, he has spent too much time here. Demands to have IV removed. MD at bedside to speak with patient

## 2020-10-25 NOTE — ED Notes (Signed)
Patient to MRI.

## 2020-10-25 NOTE — ED Provider Notes (Signed)
[facility] Emergency Department Provider Note  ____________________________________________   Event Date/Time   First MD Initiated Contact with Patient 10/25/20 1212     (approximate)  I have reviewed the triage vital signs and the nursing notes.   HISTORY  Chief Complaint Wound Infection   HPI Edward Owens is a 45 y.o. male with past medical history of migraine headaches who presents for assessment of some chronic but progressive pain in the right fifth toe.  Patient states he came yesterday but left before being seen because of the long wait.  He does not recall any preceding injuries or falls.  States it has just been getting more sore over the last couple of months.  He denies pain in any of the other toes or foot.  He has no fevers, chills, cough, chest pain, nausea, vomiting, diarrhea, burning with urination, rash anywhere else other than some discoloration around the right fifth toe or any other acute sick symptoms.  He denies any IV drug use or history of HIV or diabetes.  No other acute concerns at this time.  No analgesia prior to arrival.         Past Medical History:  Diagnosis Date   Migraine     Patient Active Problem List   Diagnosis Date Noted   Migraine     Past Surgical History:  Procedure Laterality Date   HERNIA REPAIR     LYMPH NODE DISSECTION Left    groin    Prior to Admission medications   Medication Sig Start Date End Date Taking? Authorizing Provider  ibuprofen (ADVIL) 600 MG tablet Take 1 tablet (600 mg total) by mouth every 6 (six) hours as needed. 07/30/19   Enid Derry, PA-C    Allergies Patient has no known allergies.  No family history on file.  Social History Social History   Tobacco Use   Smoking status: Every Day    Packs/day: 0.50    Years: 15.00    Pack years: 7.50    Types: Cigarettes   Smokeless tobacco: Never  Vaping Use   Vaping Use: Never used  Substance Use Topics   Alcohol use:  No   Drug use: No    Review of Systems  Review of Systems  Constitutional:  Negative for chills and fever.  HENT:  Negative for sore throat.   Eyes:  Negative for pain.  Respiratory:  Negative for cough and stridor.   Cardiovascular:  Negative for chest pain.  Gastrointestinal:  Negative for vomiting.  Genitourinary:  Negative for dysuria.  Musculoskeletal:  Positive for myalgias (R 5th toe).  Skin:  Negative for rash.  Neurological:  Negative for seizures, loss of consciousness and headaches.  Psychiatric/Behavioral:  Negative for suicidal ideas.   All other systems reviewed and are negative.    ____________________________________________   PHYSICAL EXAM:  VITAL SIGNS: ED Triage Vitals  Enc Vitals Group     BP 10/25/20 1026 (!) 151/86     Pulse Rate 10/25/20 1026 96     Resp 10/25/20 1026 20     Temp 10/25/20 1026 98.7 F (37.1 C)     Temp Source 10/25/20 1026 Oral     SpO2 10/25/20 1026 97 %     Weight 10/25/20 1027 200 lb (90.7 kg)     Height 10/25/20 1027 5\' 9"  (1.753 m)     Head Circumference --      Peak Flow --      Pain Score 10/25/20  1027 7     Pain Loc --      Pain Edu? --      Excl. in GC? --    Vitals:   10/25/20 1159 10/25/20 1335  BP: 137/77 125/83  Pulse: 63 64  Resp: 17 16  Temp:    SpO2: 99% 98%   Physical Exam Vitals and nursing note reviewed.  Constitutional:      Appearance: He is well-developed.  HENT:     Head: Normocephalic and atraumatic.     Right Ear: External ear normal.     Left Ear: External ear normal.     Nose: Nose normal.  Eyes:     Conjunctiva/sclera: Conjunctivae normal.  Cardiovascular:     Rate and Rhythm: Normal rate and regular rhythm.     Heart sounds: No murmur heard. Pulmonary:     Effort: Pulmonary effort is normal. No respiratory distress.     Breath sounds: Normal breath sounds.  Abdominal:     Palpations: Abdomen is soft.     Tenderness: There is no abdominal tenderness.  Musculoskeletal:      Cervical back: Neck supple.  Skin:    General: Skin is warm and dry.  Neurological:     Mental Status: He is alert and oriented to person, place, and time.    Patient is fifth toe is edematous tender and discolored slightly erythematous and a darker shade of brown.  There is an erosion and ulceration on the medial aspect without active drainage.  Remainder of toes are unremarkable.  2+ DP pulse.  Sensation is intact to light touch throughout the foot. ____________________________________________   LABS (all labs ordered are listed, but only abnormal results are displayed)  Labs Reviewed  COMPREHENSIVE METABOLIC PANEL - Abnormal; Notable for the following components:      Result Value   Glucose, Bld 102 (*)    All other components within normal limits  CBC WITH DIFFERENTIAL/PLATELET - Abnormal; Notable for the following components:   WBC 14.6 (*)    Neutro Abs 10.1 (*)    All other components within normal limits  CULTURE, BLOOD (SINGLE)  LACTIC ACID, PLASMA  LACTIC ACID, PLASMA   ____________________________________________  EKG  ____________________________________________  RADIOLOGY  ED MD interpretation: X-ray obtained yesterday during patient's initial presentation showed possible changes at the fifth distal phalanx and erosions concerning for possible osteomyelitis.    Official radiology report(s): DG Toe 5th Right  Result Date: 10/24/2020 CLINICAL DATA:  Toe injury dark in color EXAM: RIGHT FIFTH TOE COMPARISON:  None. FINDINGS: No definitive fracture is seen. Soft tissue swelling without emphysema. Questionable small erosion on the lateral side at the fifth PIP joint. Question resorptive change at the tuft of the fifth distal phalanx. IMPRESSION: 1. Possible mild resorptive change at the fifth distal phalanx tuft with possible subtle erosion at fifth IP joint, osteomyelitis cannot be excluded. Consider correlation with MRI. Electronically Signed   By: Jasmine Pang  M.D.   On: 10/24/2020 22:17    ____________________________________________   PROCEDURES  Procedure(s) performed (including Critical Care):  Procedures   ____________________________________________   INITIAL IMPRESSION / ASSESSMENT AND PLAN / ED COURSE        Patient presents with above-stated history exam consistent with chronic and progressive pain in the right fifth toe.  No history of preceding trauma or injuries.  He does not have a history of neuropathy or diabetes he is aware of.  On arrival he is afebrile and hemodynamically stable.  On exam he does have some erythema tenderness and discoloration of the toe and an ulcerated area on the medial aspect.  There is no drainage.  Remainder of the foot is unremarkable and he is otherwise neurovascular intact.  I was able to review his x-ray from yesterday which showed possible osteomyelitis.  No new fractures.  Compartments are soft and Evalose suspicion for compartment syndrome.  I will obtain an MRI to determine if osteomyelitis is present or not.  Also discussed with Dr. Logan Bores on-call for podiatry who agreed with obtaining an MRI and noted that given chronicity and no evidence of sepsis patient can likely follow-up in clinic even if there was some chronic osteomyelitis on MRI.  CMP obtained today shows no significant electrolyte or metabolic derangements.  Lactic acid is nonelevated.  CBC remarkable for WBC count of 14.6 without acute anemia and normal platelets.  Care patient signed over to assuming provider approximately 1500.  Plan is to follow-up MRI and discharge with outpatient podiatry follow-up with antibiotics to cover possible cellulitis versus chronic osteomyelitis.        ____________________________________________   FINAL CLINICAL IMPRESSION(S) / ED DIAGNOSES  Final diagnoses:  Toe infection    Medications  vancomycin (VANCOREADY) IVPB 2000 mg/400 mL (2,000 mg Intravenous New Bag/Given 10/25/20 1334)   acetaminophen (TYLENOL) tablet 1,000 mg (1,000 mg Oral Given 10/25/20 1238)  ibuprofen (ADVIL) tablet 400 mg (400 mg Oral Given 10/25/20 1238)  ceFEPIme (MAXIPIME) 2 g in sodium chloride 0.9 % 100 mL IVPB (0 g Intravenous Stopped 10/25/20 1331)     ED Discharge Orders     None        Note:  This document was prepared using Dragon voice recognition software and may include unintentional dictation errors.    Gilles Chiquito, MD 10/25/20 [phone]

## 2020-10-25 NOTE — ED Notes (Signed)
Patient decided to leave AMA, MD at bedside to discuss risks.

## 2020-10-25 NOTE — ED Provider Notes (Signed)
-----------------------------------------   3:12 PM on 10/25/2020 -----------------------------------------  Blood pressure 125/83, pulse 64, temperature 98.7 F (37.1 C), temperature source Oral, resp. rate 16, height 5\' 9"  (1.753 m), weight 90.7 kg, SpO2 98 %.  Assuming care from Dr. .  In short, Edward Owens is a 45 y.o. male with a chief complaint of Wound Infection .  Refer to the original H&P for additional details.  The current plan of care is to follow-up MRI results for osteomyelitis.  ----------------------------------------- 5:37 PM on 10/25/2020 ----------------------------------------- MRI is consistent with acute osteomyelitis of right fifth toe.  Findings were discussed with Dr. 10/27/2020 from podiatry, who agrees that admission for IV antibiotics is warranted.  I had a long discussion with the patient regarding this, however he is adamant that he is discharged home.  I explained the risks of worsening infection, sepsis, and amputation to him, however he continues to express desire for discharge home.  Given this, patient will be leaving AGAINST MEDICAL ADVICE and Dr. Logan Bores recommends that we prescribe doxycycline.  Patient was counseled to follow-up with podiatry and advised that he may return to the hospital at any time for reevaluation and potential admission.  Patient expresses understanding and has capacity to make this decision at this time.    Logan Bores, MD 10/25/20 [phone]

## 2020-10-30 LAB — CULTURE, BLOOD (SINGLE)
Culture: NO GROWTH
Special Requests: ADEQUATE

## 2020-10-30 LAB — AEROBIC/ANAEROBIC CULTURE W GRAM STAIN (SURGICAL/DEEP WOUND)
Culture: NORMAL
Gram Stain: NONE SEEN

## 2020-11-05 ENCOUNTER — Other Ambulatory Visit: Payer: Self-pay

## 2020-11-05 ENCOUNTER — Emergency Department: Payer: Self-pay

## 2020-11-05 DIAGNOSIS — E871 Hypo-osmolality and hyponatremia: Secondary | ICD-10-CM | POA: Diagnosis present

## 2020-11-05 DIAGNOSIS — G43909 Migraine, unspecified, not intractable, without status migrainosus: Secondary | ICD-10-CM | POA: Diagnosis present

## 2020-11-05 DIAGNOSIS — M609 Myositis, unspecified: Secondary | ICD-10-CM | POA: Diagnosis present

## 2020-11-05 DIAGNOSIS — F17210 Nicotine dependence, cigarettes, uncomplicated: Secondary | ICD-10-CM | POA: Diagnosis present

## 2020-11-05 DIAGNOSIS — Z20822 Contact with and (suspected) exposure to covid-19: Secondary | ICD-10-CM | POA: Diagnosis present

## 2020-11-05 DIAGNOSIS — Z792 Long term (current) use of antibiotics: Secondary | ICD-10-CM

## 2020-11-05 DIAGNOSIS — L84 Corns and callosities: Secondary | ICD-10-CM | POA: Diagnosis present

## 2020-11-05 DIAGNOSIS — R21 Rash and other nonspecific skin eruption: Secondary | ICD-10-CM | POA: Diagnosis present

## 2020-11-05 DIAGNOSIS — L03031 Cellulitis of right toe: Secondary | ICD-10-CM | POA: Diagnosis present

## 2020-11-05 DIAGNOSIS — B070 Plantar wart: Secondary | ICD-10-CM | POA: Diagnosis present

## 2020-11-05 DIAGNOSIS — M009 Pyogenic arthritis, unspecified: Secondary | ICD-10-CM | POA: Diagnosis present

## 2020-11-05 DIAGNOSIS — M869 Osteomyelitis, unspecified: Principal | ICD-10-CM | POA: Diagnosis present

## 2020-11-05 LAB — COMPREHENSIVE METABOLIC PANEL
ALT: 33 U/L (ref 0–44)
AST: 28 U/L (ref 15–41)
Albumin: 4.2 g/dL (ref 3.5–5.0)
Alkaline Phosphatase: 72 U/L (ref 38–126)
Anion gap: 8 (ref 5–15)
BUN: 12 mg/dL (ref 6–20)
CO2: 23 mmol/L (ref 22–32)
Calcium: 9.2 mg/dL (ref 8.9–10.3)
Chloride: 102 mmol/L (ref 98–111)
Creatinine, Ser: 1.2 mg/dL (ref 0.61–1.24)
GFR, Estimated: >60 mL/min (ref >60)
Glucose, Bld: 164 mg/dL — ABNORMAL HIGH (ref 70–99)
Potassium: 3.6 mmol/L (ref 3.5–5.1)
Sodium: 133 mmol/L — ABNORMAL LOW (ref 135–145)
Total Bilirubin: 0.8 mg/dL (ref 0.3–1.2)
Total Protein: 7.9 g/dL (ref 6.5–8.1)

## 2020-11-05 LAB — CBC WITH DIFFERENTIAL/PLATELET
Abs Immature Granulocytes: 0.05 10*3/uL (ref 0.00–0.07)
Basophils Absolute: 0.1 10*3/uL (ref 0.0–0.1)
Basophils Relative: 1 %
Eosinophils Absolute: 0.1 10*3/uL (ref 0.0–0.5)
Eosinophils Relative: 1 %
HCT: 42.1 % (ref 39.0–52.0)
Hemoglobin: 14.1 g/dL (ref 13.0–17.0)
Immature Granulocytes: 1 %
Lymphocytes Relative: 26 %
Lymphs Abs: 2.3 10*3/uL (ref 0.7–4.0)
MCH: 31.6 pg (ref 26.0–34.0)
MCHC: 33.5 g/dL (ref 30.0–36.0)
MCV: 94.4 fL (ref 80.0–100.0)
Monocytes Absolute: 0.5 10*3/uL (ref 0.1–1.0)
Monocytes Relative: 6 %
Neutro Abs: 6 10*3/uL (ref 1.7–7.7)
Neutrophils Relative %: 65 %
Platelets: 177 10*3/uL (ref 150–400)
RBC: 4.46 MIL/uL (ref 4.22–5.81)
RDW: 14.5 % (ref 11.5–15.5)
Smear Review: NORMAL
WBC: 8.9 10*3/uL (ref 4.0–10.5)
nRBC: 0 % (ref 0.0–0.2)

## 2020-11-05 LAB — LACTIC ACID, PLASMA: Lactic Acid, Venous: 2 mmol/L (ref 0.5–1.9)

## 2020-11-05 IMAGING — CR DG FOOT COMPLETE 3+V*R*
1 series · 3 of 3 positions shown · non-contrast
Comparison: [DATE]

CLINICAL DATA: Right foot pain and cellulitis.

EXAM:
RIGHT FOOT COMPLETE - 3+ VIEW

[Series 1: dg foot complete right · 0.14mm/px · 3 of 3 slices shown]
[im 1/3]
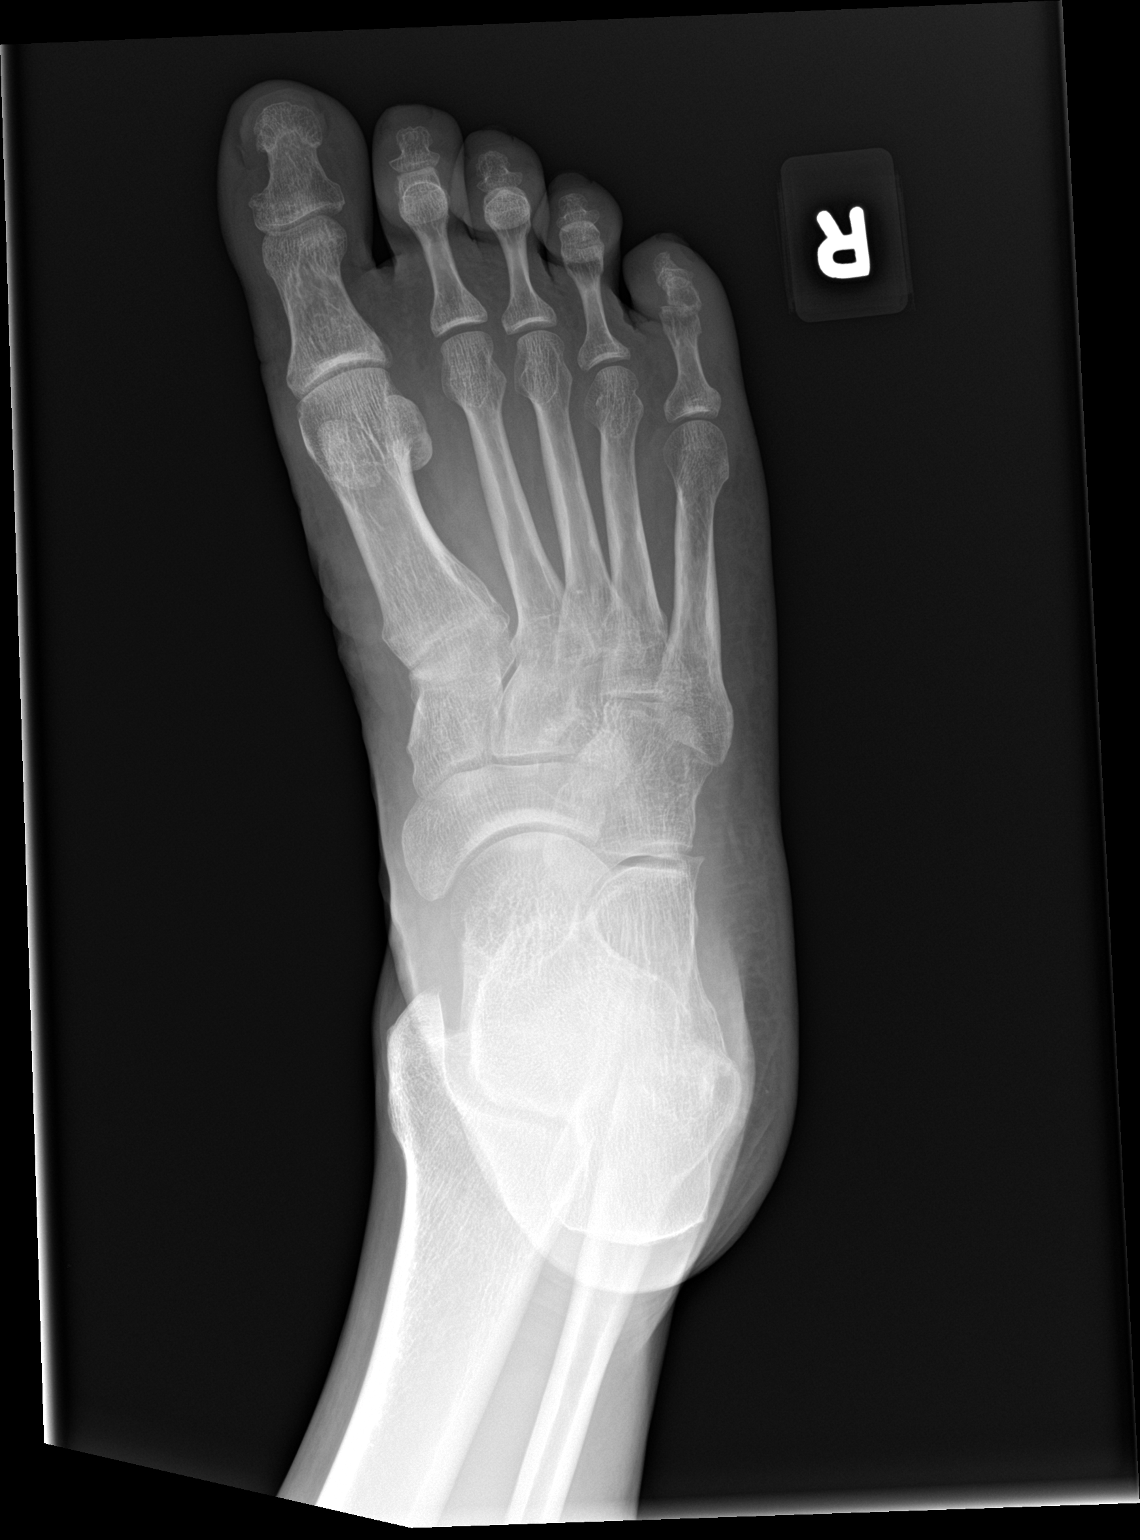
[im 2/3]
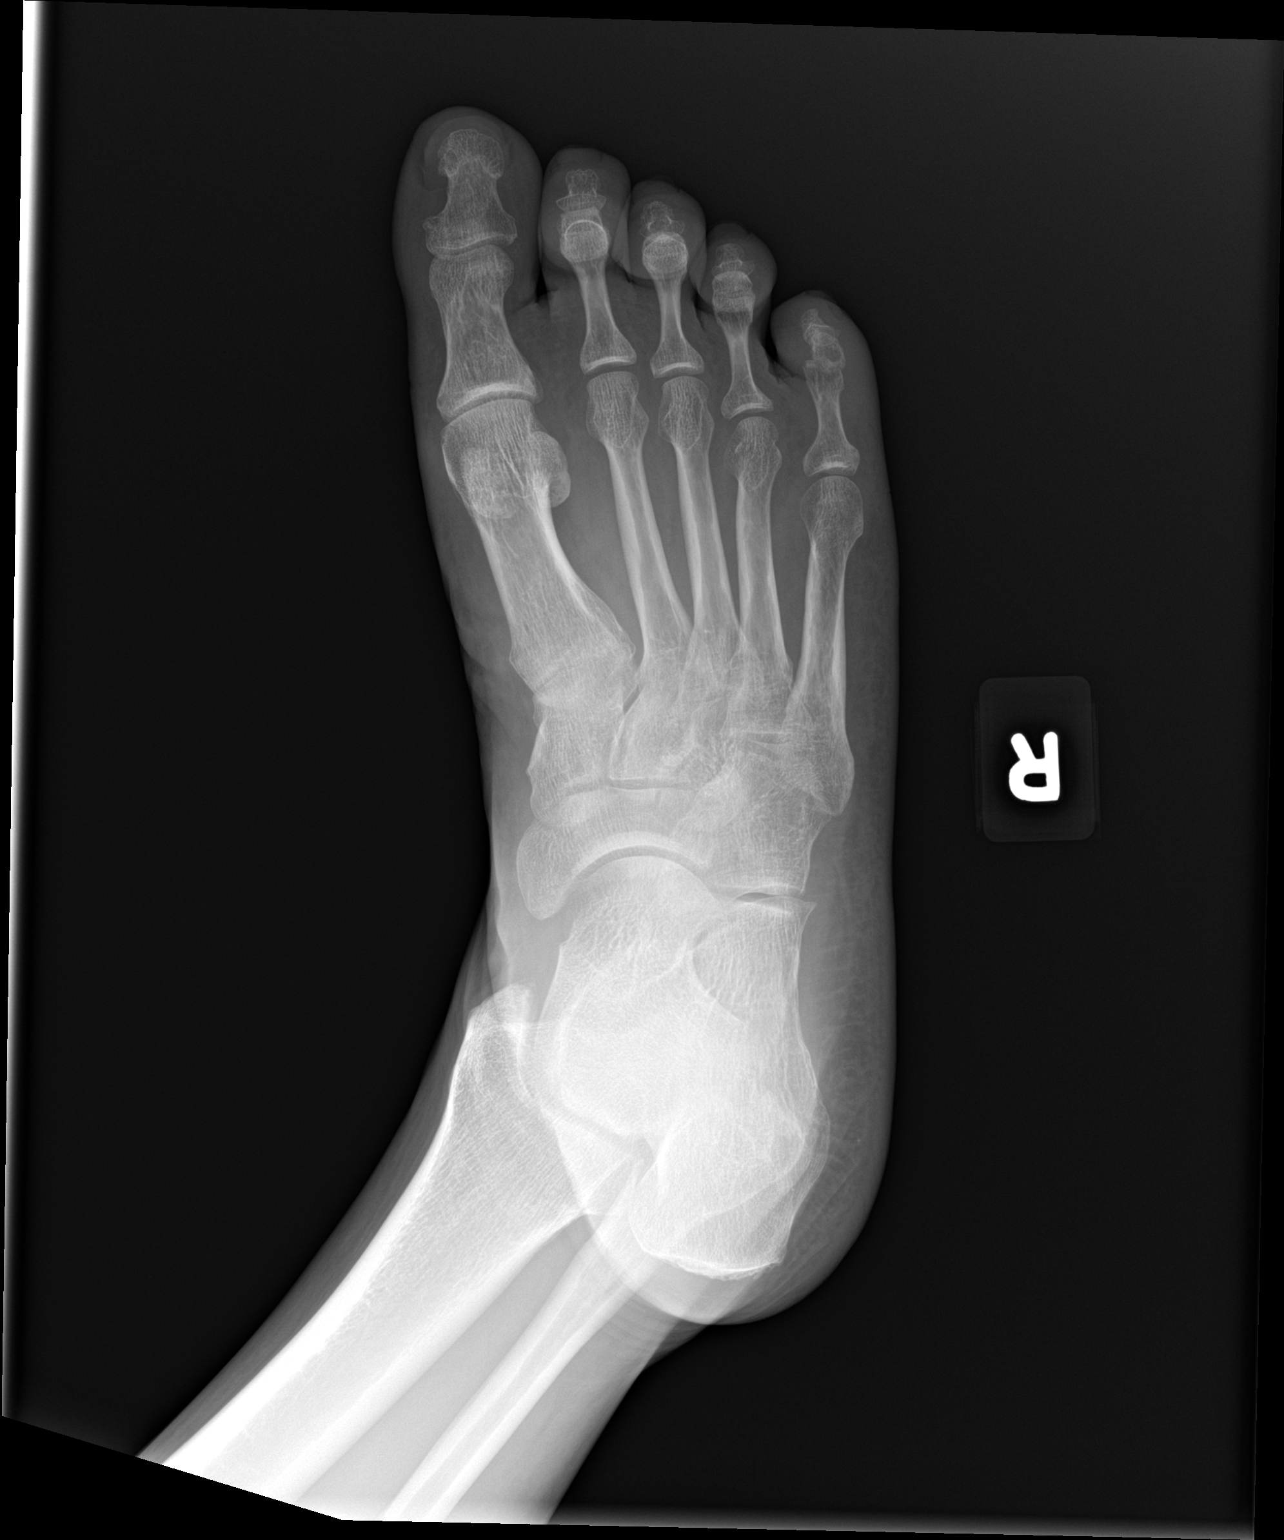
[im 3/3]
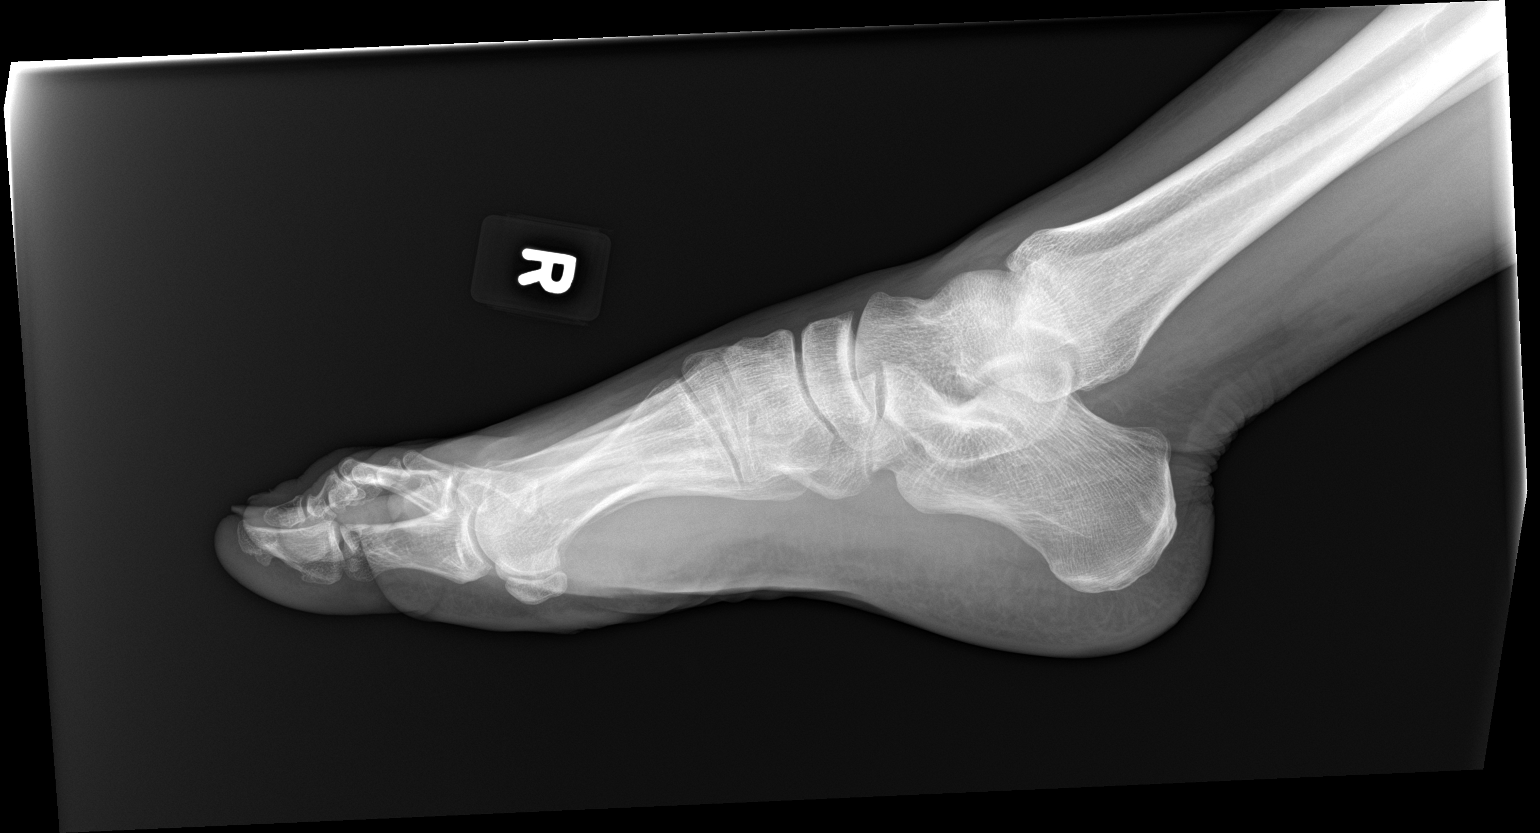

[3 of 3 positions shown; findings below may reference images not displayed]

FINDINGS: No evidence of fracture. Mild degenerative changes and subluxation
is again seen involving the PIP joint of the little toe. Little toe
soft tissue swelling is seen, however there is no evidence of
osteolysis or periostitis.
IMPRESSION: Little toe soft tissue swelling. No radiographic evidence of
osteomyelitis.

Mild degenerative changes and subluxation of little toe PIP joint,
without significant change.

## 2020-11-05 NOTE — ED Provider Notes (Signed)
Emergency Medicine Provider Triage Evaluation Note  Edward Owens , a 45 y.o. male  was evaluated in triage.  Pt complains of right foot pain.  Began having problems 5 months and was suppose to have inpatient IV antibiotics but left AMA.  5 days ago put his shoe on to go to work and pus came out.  Denies prior injury 5 months ago but has warts on his feet he sometimes tries to cut them out.   Review of Systems  Positive: Right 5th toe pain and swelling Negative: No hx of DM  Physical Exam  BP 139/88 (BP Location: Right Arm)   Pulse 98   Temp 98.3 F (36.8 C) (Oral)   Resp 20   Ht 5\' 9"  (1.753 m)   Wt 91.6 kg   SpO2 97%   BMI 29.83 kg/m  Gen:   Awake, no distress   Resp:  Normal effort  MSK:   Right 5th toe swollen.  No active drainage at present Other:    Medical Decision Making  Medically screening exam initiated at 10:21 AM.  Appropriate orders placed.  Edward Owens was informed that the remainder of the evaluation will be completed by another provider, this initial triage assessment does not replace that evaluation, and the importance of remaining in the ED until their evaluation is complete.     Edward Nieves, PA-C 11/05/20 1035    13/05/22, MD 11/05/20 [phone]

## 2020-11-05 NOTE — ED Triage Notes (Signed)
Pt arrived to ed via pov for foot pain and infection. Rt pinky to swollen and dark discoloration noted. Pt previously seen and was to be admitted to hospital for IV antibiotics, pt left ama. Pt returned had follow up with pcp and was told to come back to ED for potential admission to receive IV antibiotics. NAD noted at this time

## 2020-11-06 ENCOUNTER — Encounter: Payer: Self-pay | Admitting: Family Medicine

## 2020-11-06 ENCOUNTER — Inpatient Hospital Stay: Payer: Self-pay

## 2020-11-06 ENCOUNTER — Inpatient Hospital Stay
Admission: EM | Admit: 2020-11-06 | Discharge: 2020-11-08 | DRG: 504 | Disposition: A | Discharge status: Home / Self Care | Payer: Self-pay | Attending: Family Medicine | Admitting: Family Medicine

## 2020-11-06 DIAGNOSIS — L03031 Cellulitis of right toe: Secondary | ICD-10-CM

## 2020-11-06 DIAGNOSIS — Z8669 Personal history of other diseases of the nervous system and sense organs: Secondary | ICD-10-CM

## 2020-11-06 DIAGNOSIS — Z91199 Patient's noncompliance with other medical treatment and regimen due to unspecified reason: Secondary | ICD-10-CM

## 2020-11-06 DIAGNOSIS — M869 Osteomyelitis, unspecified: Principal | ICD-10-CM

## 2020-11-06 DIAGNOSIS — Z720 Tobacco use: Secondary | ICD-10-CM

## 2020-11-06 DIAGNOSIS — Z716 Tobacco abuse counseling: Secondary | ICD-10-CM

## 2020-11-06 LAB — BASIC METABOLIC PANEL
Anion gap: 9 (ref 5–15)
BUN: 15 mg/dL (ref 6–20)
CO2: 23 mmol/L (ref 22–32)
Calcium: 9.4 mg/dL (ref 8.9–10.3)
Chloride: 107 mmol/L (ref 98–111)
Creatinine, Ser: 1.1 mg/dL (ref 0.61–1.24)
GFR, Estimated: >60 mL/min (ref >60)
Glucose, Bld: 102 mg/dL — ABNORMAL HIGH (ref 70–99)
Potassium: 4.1 mmol/L (ref 3.5–5.1)
Sodium: 139 mmol/L (ref 135–145)

## 2020-11-06 LAB — CBC
HCT: 41.2 % (ref 39.0–52.0)
Hemoglobin: 14 g/dL (ref 13.0–17.0)
MCH: 32.3 pg (ref 26.0–34.0)
MCHC: 34 g/dL (ref 30.0–36.0)
MCV: 94.9 fL (ref 80.0–100.0)
Platelets: 174 10*3/uL (ref 150–400)
RBC: 4.34 MIL/uL (ref 4.22–5.81)
RDW: 14.6 % (ref 11.5–15.5)
WBC: 12.7 10*3/uL — ABNORMAL HIGH (ref 4.0–10.5)
nRBC: 0 % (ref 0.0–0.2)

## 2020-11-06 LAB — RESP PANEL BY RT-PCR (FLU A&B, COVID) ARPGX2
Influenza A by PCR: NEGATIVE
Influenza B by PCR: NEGATIVE
SARS Coronavirus 2 by RT PCR: NEGATIVE

## 2020-11-06 LAB — C-REACTIVE PROTEIN: CRP: 0.8 mg/dL (ref <1.0)

## 2020-11-06 LAB — HIV ANTIBODY (ROUTINE TESTING W REFLEX): HIV Screen 4th Generation wRfx: NONREACTIVE

## 2020-11-06 LAB — SEDIMENTATION RATE: Sed Rate: 21 mm/hr — ABNORMAL HIGH (ref 0–15)

## 2020-11-06 LAB — LACTIC ACID, PLASMA: Lactic Acid, Venous: 1 mmol/L (ref 0.5–1.9)

## 2020-11-06 IMAGING — MR MR FOOT*R* W/O CM
5 series · 40 of 40 positions shown · non-contrast
Comparison: [DATE] and radiographs from [DATE]

CLINICAL DATA: Worsening right fifth toe swelling and pain.
Osteomyelitis of the fifth toe shown on MRI from [DATE].

EXAM:
MRI OF THE RIGHT FOREFOOT WITHOUT CONTRAST
TECHNIQUE: Multiplanar, multisequence MR imaging of the right forefoot was
performed. No intravenous contrast was administered.

[Series 3: T1 · coronal · right · 3.0mm · 0.38mm/px · 11 of 45 slices shown (1 of 2)]
[im 1/45]
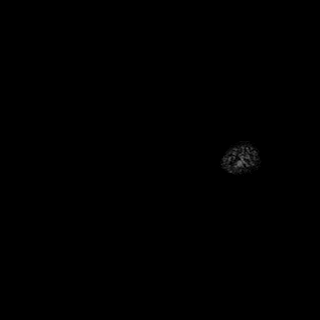
[im 5/45]
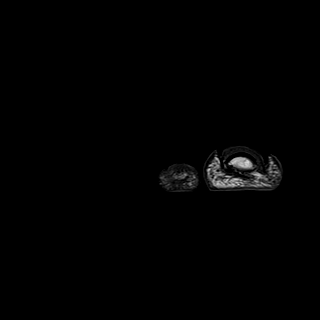
[im 9/45]
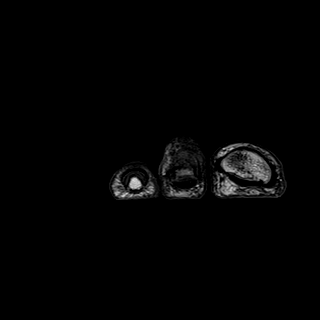
[im 14/45]
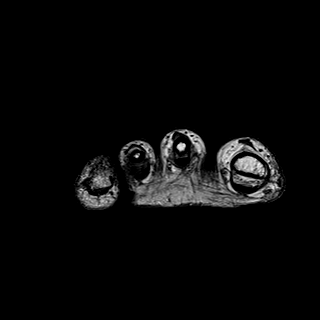
[im 18/45]
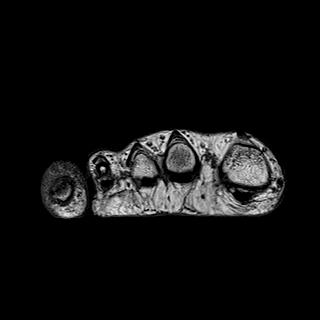
[im 23/45]
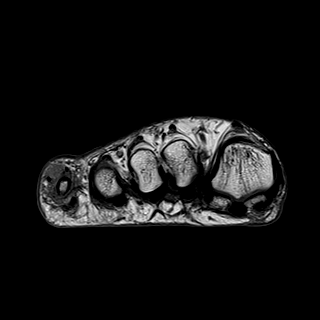
[im 27/45]
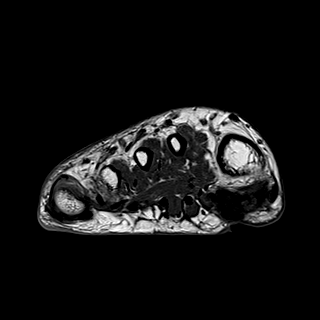
[im 31/45]
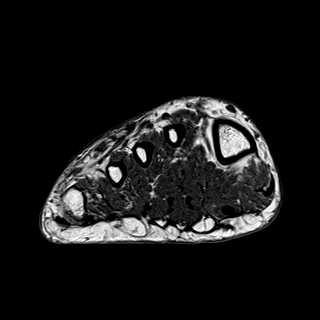
[im 36/45]
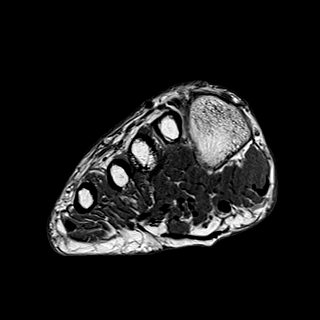
[im 40/45]
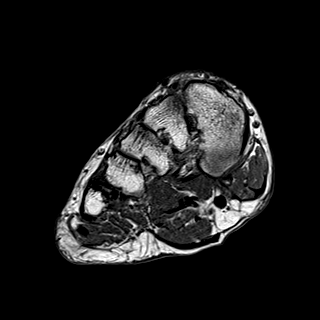
[im 45/45]
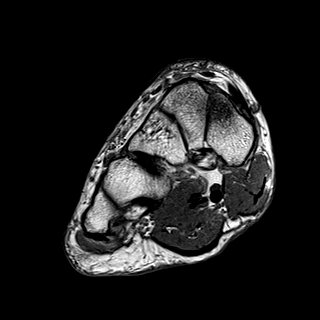

[Series 5: T2 · coronal · right · 3.0mm · 0.38mm/px · 11 of 45 slices shown (1 of 2)]
[im 1/45]
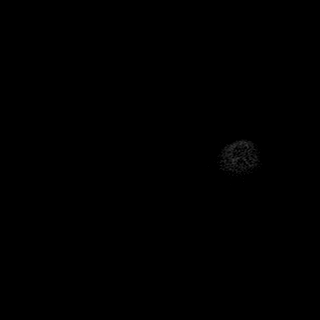
[im 5/45]
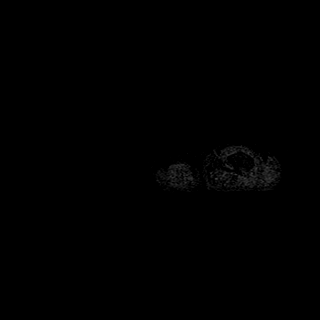
[im 9/45]
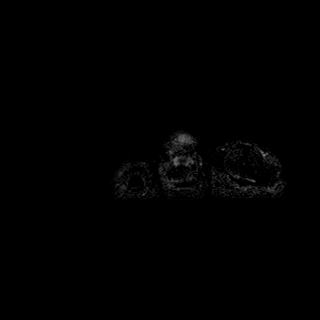
[im 14/45]
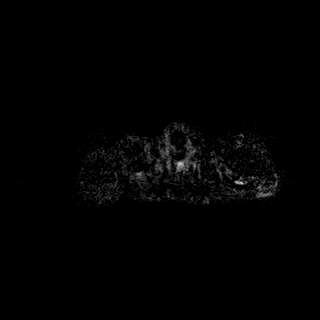
[im 18/45]
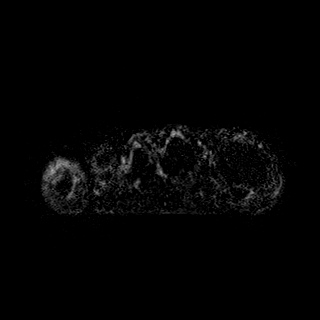
[im 23/45]
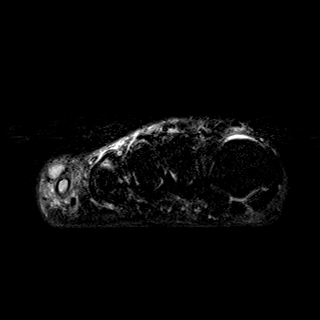
[im 27/45]
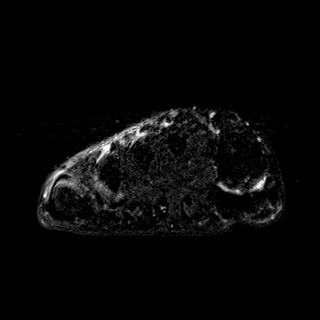
[im 31/45]
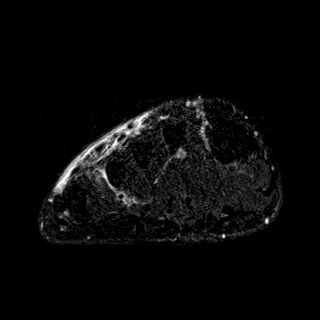
[im 36/45]
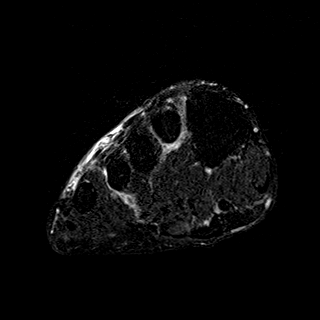
[im 40/45]
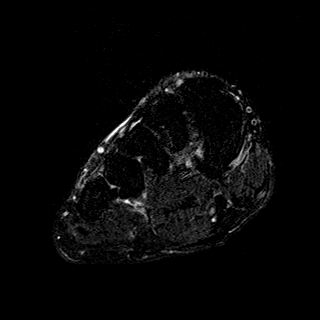
[im 45/45]
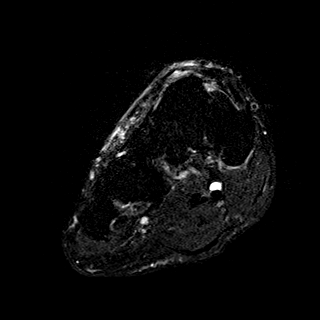

[Series 6: T1 · axial · right · 3.0mm · 0.70mm/px · z∈[-131,-57]mm · 5 of 20 slices shown (2 of 2)]
[im 1/20]
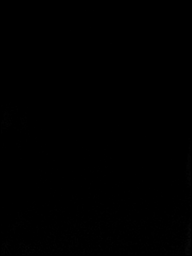
[im 5/20]
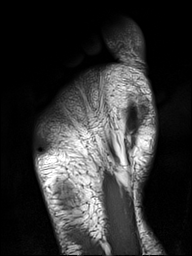
[im 10/20]
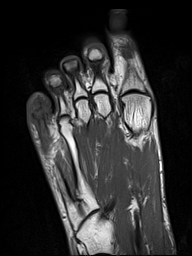
[im 15/20]
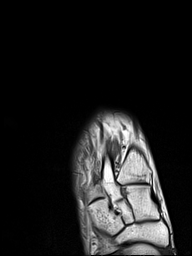
[im 20/20]
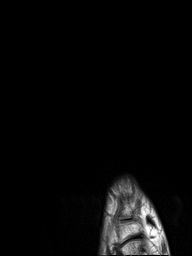

[Series 8: T2 · axial · right · 3.0mm · 0.70mm/px · z∈[-131,-57]mm · 5 of 20 slices shown (2 of 2)]
[im 1/20]
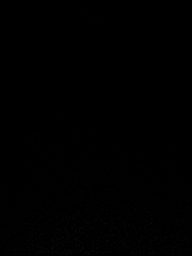
[im 5/20]
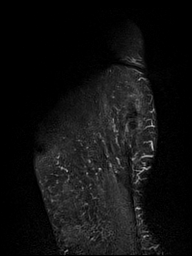
[im 10/20]
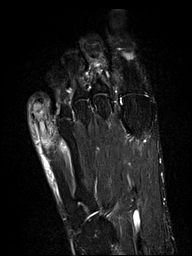
[im 15/20]
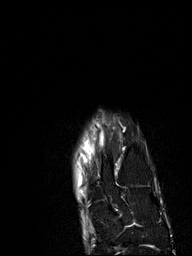
[im 20/20]
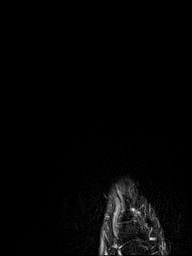

[Series 9: STIR · sagittal · right · 3.0mm · 0.62mm/px · 8 of 31 slices shown]
[im 1/31]
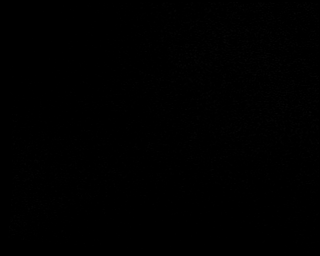
[im 5/31]
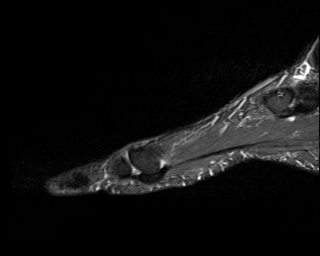
[im 9/31]
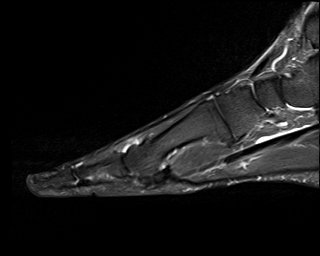
[im 13/31]
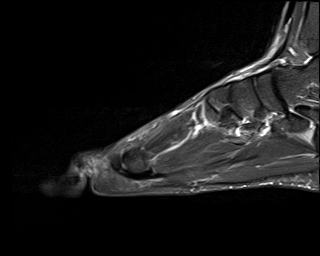
[im 18/31]
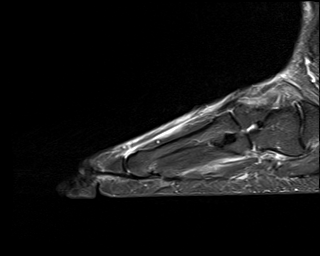
[im 22/31]
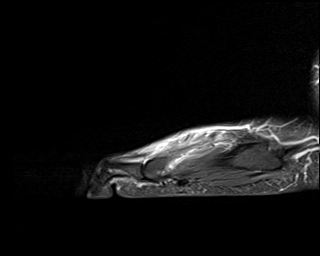
[im 26/31]
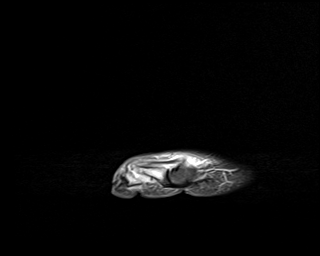
[im 31/31]
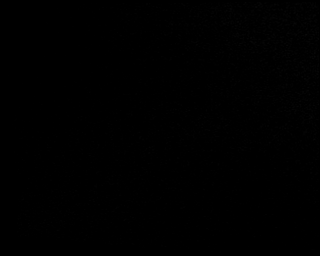

[40 of 40 positions shown; findings below may reference images not displayed]

FINDINGS: Despite efforts by the technologist and patient, motion artifact is
present on today's exam and could not be eliminated. This reduces
exam sensitivity and specificity.

Bones/Joint/Cartilage

Diffuse abnormal osseous edema in the proximal and middle phalanges
of the small toe with an effusion the proximal interphalangeal joint
(image 5, series 9) similar to that shown on [DATE]. Appearance
is compatible with acute osteomyelitis and quite likely septic
proximal interphalangeal joint.

Ligaments

Lisfranc ligament appears intact.

Muscles and Tendons

Trace edema along the interosseous musculature between the distal
fourth and fifth metatarsals, mildly increased from [DATE] and
potentially reflecting local myositis.

Soft tissues

Substantial cellulitis in the fifth toe. Edema tracks back along the
dorsal forefoot, potentially also reflecting cellulitis. This is
overall similar to prior.
IMPRESSION: 1. Acute osteomyelitis of the proximal and middle phalanges of the
small toe, likely septic proximal interphalangeal joint. Surrounding
cellulitis, tracking back into the dorsum of foot. The only
substantial change from [DATE] is that there is slightly greater
edema tracking along the interosseous musculature between the distal
fourth and fifth metatarsals which may reflect early local myositis.

## 2020-11-06 MED ORDER — TRAZODONE HCL 50 MG PO TABS
25.0000 mg | ORAL_TABLET | Freq: Every evening | ORAL | Status: DC | PRN
  Administered 2020-11-07: 25 mg via ORAL
  Filled 2020-11-06: qty 1

## 2020-11-06 MED ORDER — VANCOMYCIN HCL 750 MG/150ML IV SOLN
750.0000 mg | Freq: Three times a day (TID) | INTRAVENOUS | Status: DC
  Administered 2020-11-06: 750 mg via INTRAVENOUS
  Filled 2020-11-06 (×3): qty 150

## 2020-11-06 MED ORDER — DIPHENHYDRAMINE HCL 50 MG/ML IJ SOLN: 25.0000 mg | Freq: Three times a day (TID) | INTRAMUSCULAR | Status: DC | PRN

## 2020-11-06 MED ORDER — VANCOMYCIN HCL IN DEXTROSE 1-5 GM/200ML-% IV SOLN
1000.0000 mg | Freq: Once | INTRAVENOUS | Status: AC
  Administered 2020-11-06: 1000 mg via INTRAVENOUS
  Filled 2020-11-06: qty 200

## 2020-11-06 MED ORDER — ENOXAPARIN SODIUM 60 MG/0.6ML IJ SOSY
0.5000 mg/kg | PREFILLED_SYRINGE | INTRAMUSCULAR | Status: DC
  Administered 2020-11-08: 45 mg via SUBCUTANEOUS
  Filled 2020-11-06 (×2): qty 0.6

## 2020-11-06 MED ORDER — SODIUM CHLORIDE 0.9 % IV SOLN
1.0000 g | Freq: Three times a day (TID) | INTRAVENOUS | Status: DC
  Administered 2020-11-06: 1 g via INTRAVENOUS
  Filled 2020-11-06: qty 1

## 2020-11-06 MED ORDER — VANCOMYCIN HCL 1250 MG/250ML IV SOLN
1250.0000 mg | Freq: Two times a day (BID) | INTRAVENOUS | Status: DC
  Administered 2020-11-06 – 2020-11-07 (×2): 1250 mg via INTRAVENOUS
  Filled 2020-11-06 (×3): qty 250

## 2020-11-06 MED ORDER — NICOTINE POLACRILEX 2 MG MT GUM
2.0000 mg | CHEWING_GUM | OROMUCOSAL | Status: DC | PRN
  Filled 2020-11-06: qty 1

## 2020-11-06 MED ORDER — IBUPROFEN 400 MG PO TABS: 600.0000 mg | ORAL_TABLET | Freq: Four times a day (QID) | ORAL | Status: DC | PRN

## 2020-11-06 MED ORDER — ONDANSETRON HCL 4 MG/2ML IJ SOLN: 4.0000 mg | Freq: Four times a day (QID) | INTRAMUSCULAR | Status: DC | PRN

## 2020-11-06 MED ORDER — SODIUM CHLORIDE 0.9 % IV SOLN
2.0000 g | Freq: Once | INTRAVENOUS | Status: AC
  Administered 2020-11-06: 2 g via INTRAVENOUS
  Filled 2020-11-06: qty 2

## 2020-11-06 MED ORDER — NICOTINE 21 MG/24HR TD PT24
21.0000 mg | MEDICATED_PATCH | Freq: Every day | TRANSDERMAL | Status: DC
  Administered 2020-11-06 – 2020-11-07 (×2): 21 mg via TRANSDERMAL
  Filled 2020-11-06 (×3): qty 1

## 2020-11-06 MED ORDER — MORPHINE SULFATE (PF) 2 MG/ML IV SOLN
2.0000 mg | INTRAVENOUS | Status: DC | PRN
  Administered 2020-11-06 (×2): 2 mg via INTRAVENOUS
  Filled 2020-11-06 (×2): qty 1

## 2020-11-06 MED ORDER — ACETAMINOPHEN 325 MG PO TABS: 650.0000 mg | ORAL_TABLET | Freq: Four times a day (QID) | ORAL | Status: DC | PRN

## 2020-11-06 MED ORDER — HYDROCORTISONE 1 % EX CREA
TOPICAL_CREAM | CUTANEOUS | Status: DC | PRN
  Filled 2020-11-06 (×2): qty 28

## 2020-11-06 MED ORDER — SODIUM CHLORIDE 0.9 % IV SOLN
2.0000 g | Freq: Three times a day (TID) | INTRAVENOUS | Status: DC
  Administered 2020-11-06 – 2020-11-08 (×7): 2 g via INTRAVENOUS
  Filled 2020-11-06 (×10): qty 2

## 2020-11-06 MED ORDER — MAGNESIUM HYDROXIDE 400 MG/5ML PO SUSP: 30.0000 mL | Freq: Every day | ORAL | Status: DC | PRN

## 2020-11-06 MED ORDER — DIPHENHYDRAMINE HCL 50 MG/ML IJ SOLN
25.0000 mg | Freq: Once | INTRAMUSCULAR | Status: AC
Start: 2020-11-06 — End: 2020-11-06
  Administered 2020-11-06: 25 mg via INTRAVENOUS
  Filled 2020-11-06: qty 1

## 2020-11-06 MED ORDER — POTASSIUM CHLORIDE IN NACL 20-0.9 MEQ/L-% IV SOLN
INTRAVENOUS | Status: AC
  Filled 2020-11-06 (×3): qty 1000

## 2020-11-06 MED ORDER — CHLORHEXIDINE GLUCONATE 4 % EX LIQD
60.0000 mL | Freq: Once | CUTANEOUS | Status: AC
  Administered 2020-11-07: 4 via TOPICAL

## 2020-11-06 MED ORDER — VANCOMYCIN HCL IN DEXTROSE 1-5 GM/200ML-% IV SOLN: 1000.0000 mg | Freq: Once | INTRAVENOUS | Status: DC

## 2020-11-06 MED ORDER — METRONIDAZOLE 500 MG PO TABS
500.0000 mg | ORAL_TABLET | Freq: Two times a day (BID) | ORAL | Status: DC
  Administered 2020-11-06 – 2020-11-08 (×4): 500 mg via ORAL
  Filled 2020-11-06 (×4): qty 1

## 2020-11-06 MED ORDER — ONDANSETRON HCL 4 MG PO TABS: 4.0000 mg | ORAL_TABLET | Freq: Four times a day (QID) | ORAL | Status: DC | PRN

## 2020-11-06 MED ORDER — SODIUM CHLORIDE 0.9 % IV SOLN: INTRAVENOUS | Status: AC

## 2020-11-06 MED ORDER — SODIUM CHLORIDE 0.9 % IV SOLN: 1.0000 g | Freq: Once | INTRAVENOUS | Status: DC

## 2020-11-06 MED ORDER — ACETAMINOPHEN 650 MG RE SUPP: 650.0000 mg | Freq: Four times a day (QID) | RECTAL | Status: DC | PRN

## 2020-11-06 MED ORDER — DIPHENHYDRAMINE HCL 25 MG PO CAPS: 25.0000 mg | ORAL_CAPSULE | Freq: Three times a day (TID) | ORAL | Status: DC | PRN

## 2020-11-06 NOTE — ED Provider Notes (Signed)
[facility] Emergency Department Provider Note  ____________________________________________   Event Date/Time   First MD Initiated Contact with Patient 11/06/20 0153     (approximate)  I have reviewed the triage vital signs and the nursing notes.   HISTORY  Chief Complaint Foot Pain    HPI Edward Owens is a 45 y.o. male with history of migraines who presents to the emergency department with osteomyelitis of the right fifth toe.  Patient has had the pain, discoloration and swelling of this toe for days, possibly weeks that is progressively worsened.  He was seen in the emergency department on 10/25/2020 and had an MRI that was concerning for osteomyelitis.  At that time admission to the hospital was recommended but patient refused and left AGAINST MEDICAL ADVICE.  Was discharged on doxycycline and given podiatry follow-up.  Patient states he never saw the podiatrist but did see his primary care doctor who put him on Levaquin and convinced him to come back to the hospital.  Patient denies any history of diabetes.  No known injury to the foot.  No fevers.  No vomiting.        Past Medical History:  Diagnosis Date   Migraine     Patient Active Problem List   Diagnosis Date Noted   Osteomyelitis of fifth toe of right foot (HCC) 11/06/2020   Migraine     Past Surgical History:  Procedure Laterality Date   HERNIA REPAIR     LYMPH NODE DISSECTION Left    groin    Prior to Admission medications   Medication Sig Start Date End Date Taking? Authorizing Provider  doxycycline (VIBRA-TABS) 100 MG tablet Take 100 mg by mouth 2 (two) times daily. 11/03/20  Yes [provider]  ibuprofen (ADVIL) 600 MG tablet Take 1 tablet (600 mg total) by mouth every 6 (six) hours as needed. 07/30/19  Yes Enid Derry, PA-C  levofloxacin (LEVAQUIN) 750 MG tablet Take 750 mg by mouth daily. 11/03/20  Yes [provider]    Allergies Patient has no known  allergies.  History reviewed. No pertinent family history.  Social History Social History   Tobacco Use   Smoking status: Every Day    Packs/day: 0.50    Years: 15.00    Pack years: 7.50    Types: Cigarettes   Smokeless tobacco: Never  Vaping Use   Vaping Use: Never used  Substance Use Topics   Alcohol use: No   Drug use: No    Review of Systems Constitutional: No fever. Eyes: No visual changes. ENT: No sore throat. Cardiovascular: Denies chest pain. Respiratory: Denies shortness of breath. Gastrointestinal: No nausea, vomiting, diarrhea. Genitourinary: Negative for dysuria. Musculoskeletal: Negative for back pain. Skin: Negative for rash. Neurological: Negative for focal weakness or numbness.  ____________________________________________   PHYSICAL EXAM:  VITAL SIGNS: ED Triage Vitals  Enc Vitals Group     BP 11/05/20 1009 139/88     Pulse Rate 11/05/20 1009 98     Resp 11/05/20 1009 20     Temp 11/05/20 1009 98.3 F (36.8 C)     Temp Source 11/05/20 1009 Oral     SpO2 11/05/20 1009 97 %     Weight 11/05/20 1009 202 lb (91.6 kg)     Height 11/05/20 1009 5\' 9"  (1.753 m)     Head Circumference --      Peak Flow --      Pain Score 11/05/20 1020 7  Pain Loc --      Pain Edu? --      Excl. in GC? --    CONSTITUTIONAL: Alert and oriented and responds appropriately to questions. Well-appearing; well-nourished HEAD: Normocephalic EYES: Conjunctivae clear, pupils appear equal, EOM appear intact ENT: normal nose; moist mucous membranes NECK: Supple, normal ROM CARD: RRR; S1 and S2 appreciated; no murmurs, no clicks, no rubs, no gallops RESP: Normal chest excursion without splinting or tachypnea; breath sounds clear and equal bilaterally; no wheezes, no rhonchi, no rales, no hypoxia or respiratory distress, speaking full sentences ABD/GI: Normal bowel sounds; non-distended; soft, non-tender, no rebound, no guarding, no peritoneal signs, no  hepatosplenomegaly BACK: The back appears normal EXT: Normal ROM in all joints; swelling and mild discoloration to the right fifth toe with a small wound to inside of this toe without any drainage.  2+ DP pulses bilaterally.  No joint effusion.  Compartments soft.  Extremities warm and well-perfused. SKIN: Normal color for age and race; warm; no rash on exposed skin NEURO: Moves all extremities equally PSYCH: The patient's mood and manner are appropriate.  ____________________________________________   LABS (all labs ordered are listed, but only abnormal results are displayed)  Labs Reviewed  COMPREHENSIVE METABOLIC PANEL - Abnormal; Notable for the following components:      Result Value   Sodium 133 (*)    Glucose, Bld 164 (*)    All other components within normal limits  LACTIC ACID, PLASMA - Abnormal; Notable for the following components:   Lactic Acid, Venous 2.0 (*)    All other components within normal limits  RESP PANEL BY RT-PCR (FLU A&B, COVID) ARPGX2  CULTURE, BLOOD (ROUTINE X 2)  CULTURE, BLOOD (ROUTINE X 2)  CBC WITH DIFFERENTIAL/PLATELET  LACTIC ACID, PLASMA  HIV ANTIBODY (ROUTINE TESTING W REFLEX)  BASIC METABOLIC PANEL  CBC   ____________________________________________  EKG   ____________________________________________  RADIOLOGY I, Shali Vesey, personally viewed and evaluated these images (plain radiographs) as part of my medical decision making, as well as reviewing the written report by the radiologist.  ED MD interpretation: Right foot x-ray shows no acute abnormality.  Official radiology report(s): DG Foot Complete Right  Result Date: 11/05/2020 CLINICAL DATA:  Right foot pain and cellulitis. EXAM: RIGHT FOOT COMPLETE - 3+ VIEW COMPARISON:  10/24/2020 FINDINGS: No evidence of fracture. Mild degenerative changes and subluxation is again seen involving the PIP joint of the little toe. Little toe soft tissue swelling is seen, however there is no  evidence of osteolysis or periostitis. IMPRESSION: Little toe soft tissue swelling. No radiographic evidence of osteomyelitis. Mild degenerative changes and subluxation of little toe PIP joint, without significant change. Electronically Signed   By: Danae Orleans M.D.   On: 11/05/2020 12:04    ____________________________________________   PROCEDURES  Procedure(s) performed (including Critical Care):  Procedures   ____________________________________________   INITIAL IMPRESSION / ASSESSMENT AND PLAN / ED COURSE  As part of my medical decision making, I reviewed the following data within the electronic MEDICAL RECORD NUMBER History obtained from family, Labs reviewed , Old chart reviewed, Radiograph reviewed , Discussed with admitting physician , and Notes from prior ED visits         Patient here with osteomyelitis of the right fifth toe.  Has been on outpatient antibiotics and finally agreed to come back to the hospital for admission for IV antibiotics after discussion with his primary care doctor.  He is not a diabetic.  Does not appear toxic, septic today.  No systemic symptoms.  Will give broad coverage with IV vancomycin and cefepime.  Will discuss with hospitalist for admission.  Patient is comfortable with this plan.  ED PROGRESS    2:00 AM  Discussed patient's case with hospitalist, Dr. Arville Care.  I have recommended admission and patient (and family if present) agree with this plan. Admitting physician will place admission orders.   I reviewed all nursing notes, vitals, pertinent previous records and reviewed/interpreted all EKGs, lab and urine results, imaging (as available).  ____________________________________________   FINAL CLINICAL IMPRESSION(S) / ED DIAGNOSES  Final diagnoses:  Osteomyelitis of fifth toe of right foot [facility])     ED Discharge Orders     None       *Please note:  CHIVAS NOTZ was evaluated in Emergency Department on 11/06/2020 for the symptoms  described in the history of present illness. He was evaluated in the context of the global COVID-19 pandemic, which necessitated consideration that the patient might be at risk for infection with the SARS-CoV-2 virus that causes COVID-19. Institutional protocols and algorithms that pertain to the evaluation of patients at risk for COVID-19 are in a state of rapid change based on information released by regulatory bodies including the CDC and federal and state organizations. These policies and algorithms were followed during the patient's care in the ED.  Some ED evaluations and interventions may be delayed as a result of limited staffing during and the pandemic.*   Note:  This document was prepared using Dragon voice recognition software and may include unintentional dictation errors.    Tayon Parekh, Layla Maw, DO 11/06/20 [phone]

## 2020-11-06 NOTE — Progress Notes (Signed)
Per podiatry, NPO after breakfast on 11/07/20 for procedure around 1700 11/7.

## 2020-11-06 NOTE — [place] Notes (Signed)
PROGRESS NOTE    KAPONO LUHN  ZHG:[id number] DOB: Oct 25, 1975 DOA: 11/06/2020 PCP: Patient, No Pcp Per (Inactive)  Chief Complaint  Patient presents with   Foot Pain    Brief Narrative:  45 y.o. African-American male with medical history significant for migraine, ongoing tobacco abuse and recent right fifth toe cellulitis and osteomyelitis.  He's returned to the ED due to recommendation from his PCP after initially leaving the ED on 10/25 AMA.    Assessment & Plan:   Active Problems:   Osteomyelitis of fifth toe of right foot ([place])  1.  Cellulitis and osteomyelitis of the right fifth toe -- hemodynamically stable, will hold abx until decision regarding procedure -- will repeat MRI R foot -- CRP, ESR pending collection -- palpable DP pulse -- podiatry c/s -- ID c/s, based on my discussion with pt, I think he'll prefer to defer Frans Valente surgical intervention - though we'll see after repeat imaging and labs and discussion with poidatry  2.  Ongoing tobacco abuse. - nicotine patch, nicotine gum -- discussed importance of smoking cessation especially with regards to wound healing/infections -- he's open to idea of quitting smoking - discussed option of meds, he's open to further discussion  # Plantar warts -- consider outpatient management with PCP, derm, or podiatry  3.  History of migraine. - She has no current headaches.  DVT prophylaxis: lovenox Code Status: full Family Communication: sig other at bedside Disposition:   Status is: Inpatient  Remains inpatient appropriate because: need for ID and podiatry eval       Consultants:  ID podiatry  Procedures:  none  Antimicrobials:  Anti-infectives (From admission, onward)    Start     Dose/Rate Route Frequency Ordered Stop   11/06/20 0500  vancomycin (VANCOREADY) IVPB 750 mg/150 mL  Status:  Discontinued        750 mg 150 mL/hr over 60 Minutes Intravenous Every 8 hours 11/06/20 0317 11/06/20 1101   11/06/20  0400  meropenem (MERREM) 1 g in sodium chloride 0.9 % 100 mL IVPB  Status:  Discontinued        1 g 200 mL/hr over 30 Minutes Intravenous Every 8 hours 11/06/20 0313 11/06/20 1101   11/06/20 0215  vancomycin (VANCOCIN) IVPB 1000 mg/200 mL premix  Status:  Discontinued        1,000 mg 200 mL/hr over 60 Minutes Intravenous  Once 11/06/20 0209 11/06/20 0238   11/06/20 0215  meropenem (MERREM) 1 g in sodium chloride 0.9 % 100 mL IVPB  Status:  Discontinued        1 g 200 mL/hr over 30 Minutes Intravenous  Once 11/06/20 0209 11/06/20 0312   11/06/20 0200  vancomycin (VANCOCIN) IVPB 1000 mg/200 mL premix        1,000 mg 200 mL/hr over 60 Minutes Intravenous  Once 11/06/20 0154 11/06/20 0239   11/06/20 0200  ceFEPIme (MAXIPIME) 2 g in sodium chloride 0.9 % 100 mL IVPB        2 g 200 mL/hr over 30 Minutes Intravenous  Once 11/06/20 0154 11/06/20 0212          Subjective: No pain, came due to recommendation from PCP  Objective: Vitals:   11/06/20 0300 11/06/20 0530 11/06/20 0600 11/06/20 0630  BP: 129/79 100/65 (!) 96/55 (!) 90/54  Pulse: 81 79 69 70  Resp:    16  Temp:      TempSrc:      SpO2: 100% 100% 98% 100%  Weight:  Height:        Intake/Output Summary (Last 24 hours) at 11/06/2020 1101 Last data filed at 11/06/2020 0730 Gross per 24 hour  Intake 238.16 ml  Output --  Net 238.16 ml   Filed Weights   11/05/20 1009 11/05/20 1020  Weight: 91.6 kg 91.6 kg    Examination:  General exam: Appears calm and comfortable  Respiratory system: unlabored Cardiovascular system: RRR Gastrointestinal system: Abdomen is nondistended, soft and nontender Central nervous system: Alert and oriented. No focal neurological deficits. Extremities: R foot with swollen and discolored 5th toe, strong DP pulse    Data Reviewed: I have personally reviewed following labs and imaging studies  CBC: Recent Labs  Lab 11/05/20 1035 11/06/20 0140  WBC 8.9 12.7*  NEUTROABS 6.0  --   HGB  14.1 14.0  HCT 42.1 41.2  MCV 94.4 94.9  PLT 177 165    Basic Metabolic Panel: Recent Labs  Lab 11/05/20 1035 11/06/20 0140  NA 133* 139  K 3.6 4.1  CL 102 107  CO2 23 23  GLUCOSE 164* 102*  BUN 12 15  CREATININE 1.20 1.10  CALCIUM 9.2 9.4    GFR: Estimated Creatinine Clearance: 93.9 mL/min (by C-G formula based on SCr of 1.1 mg/dL).  Liver Function Tests: Recent Labs  Lab 11/05/20 1035  AST 28  ALT 33  ALKPHOS 72  BILITOT 0.8  PROT 7.9  ALBUMIN 4.2    CBG: No results for input(s): GLUCAP in the last 168 hours.   Recent Results (from the past 240 hour(s))  Culture, blood (Routine X 2) w Reflex to ID Panel     Status: None (Preliminary result)   Collection Time: 11/06/20  1:40 AM   Specimen: BLOOD  Result Value Ref Range Status   Specimen Description BLOOD RFA  Final   Special Requests   Final    BOTTLES DRAWN AEROBIC AND ANAEROBIC Blood Culture adequate volume   Culture   Final    NO GROWTH < 12 HOURS Performed at [facility], [address], [place], [place] [address]    Report Status PENDING  Incomplete  Culture, blood (Routine X 2) w Reflex to ID Panel     Status: None (Preliminary result)   Collection Time: 11/06/20  1:40 AM   Specimen: BLOOD  Result Value Ref Range Status   Specimen Description BLOOD RFA  Final   Special Requests   Final    BOTTLES DRAWN AEROBIC AND ANAEROBIC Blood Culture results may not be optimal due to an inadequate volume of blood received in culture bottles   Culture   Final    NO GROWTH < 12 HOURS Performed at [facility], [address] [place], [place] [address]    Report Status PENDING  Incomplete  Resp Panel by RT-PCR (Flu Tahliyah Anagnos&B, Covid) Nasopharyngeal Swab     Status: None   Collection Time: 11/06/20  1:55 AM   Specimen: Nasopharyngeal Swab; Nasopharyngeal(NP) swabs in vial transport medium  Result Value Ref Range Status   SARS Coronavirus 2 by RT PCR NEGATIVE NEGATIVE Final    Comment:  (NOTE) SARS-CoV-2 target nucleic acids are NOT DETECTED.  The SARS-CoV-2 RNA is generally detectable in upper respiratory specimens during the acute phase of infection. The lowest concentration of SARS-CoV-2 viral copies this assay can detect is 138 copies/mL. Sherleen Pangborn negative result does not preclude SARS-Cov-2 infection and should not be used as the sole basis for treatment or other patient management decisions. Nakisha Chai negative result  may occur with  improper specimen collection/handling, submission of specimen other than nasopharyngeal swab, presence of viral mutation(s) within the areas targeted by this assay, and inadequate number of viral copies(<138 copies/mL). Drew Lips negative result must be combined with clinical observations, patient history, and epidemiological information. The expected result is Negative.  Fact Sheet for Patients:  EntrepreneurPulse.com.au  Fact Sheet for Healthcare Providers:  IncredibleEmployment.be  This test is no t yet approved or cleared by the [place] FDA and  has been authorized for detection and/or diagnosis of SARS-CoV-2 by FDA under an Emergency Use Authorization (EUA). This EUA will remain  in effect (meaning this test can be used) for the duration of the COVID-19 declaration under Section 564(b)(1) of the Act, 21 U.S.C.section 360bbb-3(b)(1), unless the authorization is terminated  or revoked sooner.       Influenza Tilak Oakley by PCR NEGATIVE NEGATIVE Final   Influenza B by PCR NEGATIVE NEGATIVE Final    Comment: (NOTE) The Xpert Xpress SARS-CoV-2/FLU/RSV plus assay is intended as an aid in the diagnosis of influenza from Nasopharyngeal swab specimens and should not be used as Amando Ishikawa sole basis for treatment. Nasal washings and aspirates are unacceptable for Xpert Xpress SARS-CoV-2/FLU/RSV testing.  Fact Sheet for Patients: EntrepreneurPulse.com.au  Fact Sheet for Healthcare  Providers: IncredibleEmployment.be  This test is not yet approved or cleared by the [place] FDA and has been authorized for detection and/or diagnosis of SARS-CoV-2 by FDA under an Emergency Use Authorization (EUA). This EUA will remain in effect (meaning this test can be used) for the duration of the COVID-19 declaration under Section 564(b)(1) of the Act, 21 U.S.C. section 360bbb-3(b)(1), unless the authorization is terminated or revoked.  Performed at [facility], [address], [place], [place] [address]          Radiology Studies: DG Foot Complete Right  Result Date: 11/05/2020 CLINICAL DATA:  Right foot pain and cellulitis. EXAM: RIGHT FOOT COMPLETE - 3+ VIEW COMPARISON:  10/24/2020 FINDINGS: No evidence of fracture. Mild degenerative changes and subluxation is again seen involving the PIP joint of the little toe. Little toe soft tissue swelling is seen, however there is no evidence of osteolysis or periostitis. IMPRESSION: Little toe soft tissue swelling. No radiographic evidence of osteomyelitis. Mild degenerative changes and subluxation of little toe PIP joint, without significant change. Electronically Signed   By: Marlaine Hind M.D.   On: 11/05/2020 12:04        Scheduled Meds:  enoxaparin (LOVENOX) injection  0.5 mg/kg Subcutaneous Q24H   nicotine  21 mg Transdermal Daily   Continuous Infusions:  0.9 % NaCl with KCl 20 mEq / L 100 mL/hr at 11/06/20 0244     LOS: 0 days    Time spent: over 30 min    Fayrene Helper, MD [facility]   To contact the attending provider between 7A-7P or the covering provider during after hours 7P-7A, please log into the web site [email or web address] and access using universal [place] password for that web site. If you do not have the password, please call the hospital operator.  11/06/2020, 11:01 AM

## 2020-11-06 NOTE — [place] Notes (Signed)
PHARMACIST - PHYSICIAN COMMUNICATION  CONCERNING:  Enoxaparin (Lovenox) for DVT Prophylaxis    RECOMMENDATION: Patient was prescribed enoxaprin 40mg  q24 hours for VTE prophylaxis.   Filed Weights   11/05/20 1009 11/05/20 1020  Weight: 91.6 kg (202 lb) 91.6 kg (202 lb)    Body mass index is 29.83 kg/m.  Estimated Creatinine Clearance: 86.1 mL/min (by C-G formula based on SCr of 1.2 mg/dL).   Based on [facility] policy patient is candidate for enoxaparin 0.5mg /kg TBW SQ every 24 hours based on BMI being >30.  DESCRIPTION: Pharmacy has adjusted enoxaparin dose per [facility] policy.  Patient is now receiving enoxaparin 0.5 mg/kg every 24 hours   [facility], PharmD, [facility] 11/06/2020 2:37 AM

## 2020-11-06 NOTE — Consult Note (Signed)
PODIATRY / FOOT AND ANKLE SURGERY CONSULTATION NOTE  Requesting Physician: Eugenie Norrie MD  Reason for consult: Right toe infection  Chief Complaint: Right fifth toe swelling, pain   HPI: Edward Owens is a 45 y.o. male who presents with a painful and swollen right fifth toe.  Patient was seen in the emergency room around 2 weeks ago for similar issue.  Patient notes that several weeks ago he was trimming a callus area between his toes on the right foot and cut a little deep and subsequently afterwards noticed a lot of redness and swelling to the toe concerning for infection.  Patient went to the emergency room after little bit of time and did have x-rays taken as well as an MRI which did show concerning signs of osteomyelitis to the right fifth toe.  Patient was advised to stay for further treatment but left AGAINST MEDICAL ADVICE.  Patient returns today at the request of his PCP due to worsening signs of infection to the right fifth toe including redness, swelling.  Patient has not seen any wounds open currently but notes that when the wound was open it was in the webspace area around the PIPJ.  Patient complains of multiple calluses to the left foot as well today.  Patient does have a history of smoking.  PMHx:  Past Medical History:  Diagnosis Date   Migraine     Surgical Hx:  Past Surgical History:  Procedure Laterality Date   HERNIA REPAIR     LYMPH NODE DISSECTION Left    groin    FHx: History reviewed. No pertinent family history.  Social History:  reports that he has been smoking cigarettes. He has a 7.50 pack-year smoking history. He has never used smokeless tobacco. He reports that he does not drink alcohol and does not use drugs.  Allergies: No Known Allergies  (Not in a hospital admission)   Physical Exam: General: Alert and oriented.  No apparent distress.  Vascular: DP/PT pulses palpable bilateral.  Capillary fill time appears to be intact to digits less than 3  seconds bilateral.  Moderate swelling and mild erythema noted to the right fifth toe.  Neuro: Light touch sensation intact to digits bilateral.  Derm: No obvious open wounds present to the right fifth toe.  Appears to have dry scab formation and callus type tissue at the medial PIPJ of the right fifth toe, mild maceration, no drainage present, no odor.  Nucleated hyperkeratotic lesions to the left foot subfirst and fifth metatarsal phalangeal joints and plantar lateral third toe DIPJ.  MSK: 5/5 strength present to bilateral lower extremities.  Hammertoe contractures digits 2 through 5 bilateral.  Limited ankle joint dorsiflexion noted with knee extended which is mildly improved knee flexion bilateral.  Results for orders placed or performed during the hospital encounter of 11/06/20 (from the past 48 hour(s))  CBC with Differential     Status: None   Collection Time: 11/05/20 10:35 AM  Result Value Ref Range   WBC 8.9 4.0 - 10.5 K/uL    Comment: WHITE COUNT CONFIRMED ON SMEAR   RBC 4.46 4.22 - 5.81 MIL/uL   Hemoglobin 14.1 13.0 - 17.0 g/dL   HCT 42.1 39.0 - 52.0 %   MCV 94.4 80.0 - 100.0 fL   MCH 31.6 26.0 - 34.0 pg   MCHC 33.5 30.0 - 36.0 g/dL   RDW 14.5 11.5 - 15.5 %   Platelets 177 150 - 400 K/uL   nRBC 0.0 0.0 - 0.2 %  Neutrophils Relative % 65 %   Neutro Abs 6.0 1.7 - 7.7 K/uL   Lymphocytes Relative 26 %   Lymphs Abs 2.3 0.7 - 4.0 K/uL   Monocytes Relative 6 %   Monocytes Absolute 0.5 0.1 - 1.0 K/uL   Eosinophils Relative 1 %   Eosinophils Absolute 0.1 0.0 - 0.5 K/uL   Basophils Relative 1 %   Basophils Absolute 0.1 0.0 - 0.1 K/uL   WBC Morphology MORPHOLOGY UNREMARKABLE    RBC Morphology MORPHOLOGY UNREMARKABLE    Smear Review Normal platelet morphology    Immature Granulocytes 1 %   Abs Immature Granulocytes 0.05 0.00 - 0.07 K/uL    Comment: Performed at [facility], [address] [place], [place] [address]  Comprehensive metabolic panel     Status:  Abnormal   Collection Time: 11/05/20 10:35 AM  Result Value Ref Range   Sodium 133 (L) 135 - 145 mmol/L   Potassium 3.6 3.5 - 5.1 mmol/L   Chloride 102 98 - 111 mmol/L   CO2 23 22 - 32 mmol/L   Glucose, Bld 164 (H) 70 - 99 mg/dL    Comment: Glucose reference range applies only to samples taken after fasting for at least 8 hours.   BUN 12 6 - 20 mg/dL   Creatinine, Ser 1.20 0.61 - 1.24 mg/dL   Calcium 9.2 8.9 - 10.3 mg/dL   Total Protein 7.9 6.5 - 8.1 g/dL   Albumin 4.2 3.5 - 5.0 g/dL   AST 28 15 - 41 U/L   ALT 33 0 - 44 U/L   Alkaline Phosphatase 72 38 - 126 U/L   Total Bilirubin 0.8 0.3 - 1.2 mg/dL   GFR, Estimated >60 >60 mL/min    Comment: (NOTE) Calculated using the CKD-EPI Creatinine Equation (2021)    Anion gap 8 5 - 15    Comment: Performed at [facility], [address] [place], [place] [address]  Lactic acid, plasma     Status: Abnormal   Collection Time: 11/05/20 10:35 AM  Result Value Ref Range   Lactic Acid, Venous 2.0 (HH) 0.5 - 1.9 mmol/L    Comment: CRITICAL RESULT CALLED TO, READ BACK BY AND VERIFIED WITH TIFFANY JOHNSON ON 11/05/20 AT 1124 QSD Performed at [facility], [address] [place], [place] [address]   Culture, blood (Routine X 2) w Reflex to ID Panel     Status: None (Preliminary result)   Collection Time: 11/06/20  1:40 AM   Specimen: BLOOD  Result Value Ref Range   Specimen Description BLOOD RFA    Special Requests      BOTTLES DRAWN AEROBIC AND ANAEROBIC Blood Culture adequate volume   Culture      NO GROWTH < 12 HOURS Performed at [facility], [address], [place], [place] [address]    Report Status PENDING   Culture, blood (Routine X 2) w Reflex to ID Panel     Status: None (Preliminary result)   Collection Time: 11/06/20  1:40 AM   Specimen: BLOOD  Result Value Ref Range   Specimen Description BLOOD RFA    Special Requests      BOTTLES DRAWN AEROBIC AND ANAEROBIC Blood Culture results may not be  optimal due to an inadequate volume of blood received in culture bottles   Culture      NO GROWTH < 12 HOURS Performed at [facility], [address], [place], [place] [address]    Report Status PENDING  HIV Antibody (routine testing w rflx)     Status: None   Collection Time: 11/06/20  1:40 AM  Result Value Ref Range   HIV Screen 4th Generation wRfx Non Reactive Non Reactive    Comment: Performed at [facility], [address], [place], [place] [id number]  Basic metabolic panel     Status: Abnormal   Collection Time: 11/06/20  1:40 AM  Result Value Ref Range   Sodium 139 135 - 145 mmol/L   Potassium 4.1 3.5 - 5.1 mmol/L    Comment: HEMOLYSIS AT THIS LEVEL MAY AFFECT RESULT   Chloride 107 98 - 111 mmol/L   CO2 23 22 - 32 mmol/L   Glucose, Bld 102 (H) 70 - 99 mg/dL    Comment: Glucose reference range applies only to samples taken after fasting for at least 8 hours.   BUN 15 6 - 20 mg/dL   Creatinine, Ser 1.10 0.61 - 1.24 mg/dL   Calcium 9.4 8.9 - 10.3 mg/dL   GFR, Estimated >60 >60 mL/min    Comment: (NOTE) Calculated using the CKD-EPI Creatinine Equation (2021)    Anion gap 9 5 - 15    Comment: Performed at [facility], [address] [place], [place] [address]  CBC     Status: Abnormal   Collection Time: 11/06/20  1:40 AM  Result Value Ref Range   WBC 12.7 (H) 4.0 - 10.5 K/uL   RBC 4.34 4.22 - 5.81 MIL/uL   Hemoglobin 14.0 13.0 - 17.0 g/dL   HCT 41.2 39.0 - 52.0 %   MCV 94.9 80.0 - 100.0 fL   MCH 32.3 26.0 - 34.0 pg   MCHC 34.0 30.0 - 36.0 g/dL   RDW 14.6 11.5 - 15.5 %   Platelets 174 150 - 400 K/uL   nRBC 0.0 0.0 - 0.2 %    Comment: Performed at [facility], [address] [place], [place] [address]  Lactic acid, plasma     Status: None   Collection Time: 11/06/20  1:55 AM  Result Value Ref Range   Lactic Acid, Venous 1.0 0.5 - 1.9 mmol/L    Comment: Performed at [facility], [address] [place], [place]  [address]  Resp Panel by RT-PCR (Flu A&B, Covid) Nasopharyngeal Swab     Status: None   Collection Time: 11/06/20  1:55 AM   Specimen: Nasopharyngeal Swab; Nasopharyngeal(NP) swabs in vial transport medium  Result Value Ref Range   SARS Coronavirus 2 by RT PCR NEGATIVE NEGATIVE    Comment: (NOTE) SARS-CoV-2 target nucleic acids are NOT DETECTED.  The SARS-CoV-2 RNA is generally detectable in upper respiratory specimens during the acute phase of infection. The lowest concentration of SARS-CoV-2 viral copies this assay can detect is 138 copies/mL. A negative result does not preclude SARS-Cov-2 infection and should not be used as the sole basis for treatment or other patient management decisions. A negative result may occur with  improper specimen collection/handling, submission of specimen other than nasopharyngeal swab, presence of viral mutation(s) within the areas targeted by this assay, and inadequate number of viral copies(<138 copies/mL). A negative result must be combined with clinical observations, patient history, and epidemiological information. The expected result is Negative.  Fact Sheet for Patients:  EntrepreneurPulse.com.au  Fact Sheet for Healthcare Providers:  IncredibleEmployment.be  This test is no t yet approved or cleared by the [place] FDA and  has been authorized for detection and/or diagnosis of SARS-CoV-2 by FDA under  an Emergency Use Authorization (EUA). This EUA will remain  in effect (meaning this test can be used) for the duration of the COVID-19 declaration under Section 564(b)(1) of the Act, 21 U.S.C.section 360bbb-3(b)(1), unless the authorization is terminated  or revoked sooner.       Influenza A by PCR NEGATIVE NEGATIVE   Influenza B by PCR NEGATIVE NEGATIVE    Comment: (NOTE) The Xpert Xpress SARS-CoV-2/FLU/RSV plus assay is intended as an aid in the diagnosis of influenza from Nasopharyngeal swab  specimens and should not be used as a sole basis for treatment. Nasal washings and aspirates are unacceptable for Xpert Xpress SARS-CoV-2/FLU/RSV testing.  Fact Sheet for Patients: EntrepreneurPulse.com.au  Fact Sheet for Healthcare Providers: IncredibleEmployment.be  This test is not yet approved or cleared by the [place] FDA and has been authorized for detection and/or diagnosis of SARS-CoV-2 by FDA under an Emergency Use Authorization (EUA). This EUA will remain in effect (meaning this test can be used) for the duration of the COVID-19 declaration under Section 564(b)(1) of the Act, 21 U.S.C. section 360bbb-3(b)(1), unless the authorization is terminated or revoked.  Performed at [facility], [address] [place], [place] [address]   Sedimentation rate     Status: Abnormal   Collection Time: 11/06/20 11:08 AM  Result Value Ref Range   Sed Rate 21 (H) 0 - 15 mm/hr    Comment: Performed at [facility], [address] [place], [place] [address]   MR FOOT RIGHT WO CONTRAST  Result Date: 11/06/2020 CLINICAL DATA:  Worsening right fifth toe swelling and pain. Osteomyelitis of the fifth toe shown on MRI from 10/25/2020. EXAM: MRI OF THE RIGHT FOREFOOT WITHOUT CONTRAST TECHNIQUE: Multiplanar, multisequence MR imaging of the right forefoot was performed. No intravenous contrast was administered. COMPARISON:  10/25/2020 and radiographs from 11/05/2020 FINDINGS: Despite efforts by the technologist and patient, motion artifact is present on today's exam and could not be eliminated. This reduces exam sensitivity and specificity. Bones/Joint/Cartilage Diffuse abnormal osseous edema in the proximal and middle phalanges of the small toe with an effusion the proximal interphalangeal joint (image 5, series 9) similar to that shown on 10/25/2020. Appearance is compatible with acute osteomyelitis and quite likely septic proximal  interphalangeal joint. Ligaments Lisfranc ligament appears intact. Muscles and Tendons Trace edema along the interosseous musculature between the distal fourth and fifth metatarsals, mildly increased from 10/25/2020 and potentially reflecting local myositis. Soft tissues Substantial cellulitis in the fifth toe. Edema tracks back along the dorsal forefoot, potentially also reflecting cellulitis. This is overall similar to prior. IMPRESSION: 1. Acute osteomyelitis of the proximal and middle phalanges of the small toe, likely septic proximal interphalangeal joint. Surrounding cellulitis, tracking back into the dorsum of foot. The only substantial change from 10/25/2020 is that there is slightly greater edema tracking along the interosseous musculature between the distal fourth and fifth metatarsals which may reflect early local myositis. Electronically Signed   By: Van Clines M.D.   On: 11/06/2020 12:16   DG Foot Complete Right  Result Date: 11/05/2020 CLINICAL DATA:  Right foot pain and cellulitis. EXAM: RIGHT FOOT COMPLETE - 3+ VIEW COMPARISON:  10/24/2020 FINDINGS: No evidence of fracture. Mild degenerative changes and subluxation is again seen involving the PIP joint of the little toe. Little toe soft tissue swelling is seen, however there is no evidence of osteolysis or periostitis. IMPRESSION: Little toe soft tissue swelling. No radiographic evidence of osteomyelitis. Mild degenerative changes and subluxation of little toe PIP  joint, without significant change. Electronically Signed   By: Danae Orleans M.D.   On: 11/05/2020 12:04    Blood pressure (!) 90/54, pulse 70, temperature 98.3 F (36.8 C), temperature source Oral, resp. rate 16, height 5\' 9"  (1.753 m), weight 91.6 kg, SpO2 100 %.  Assessment Right fifth toe osteomyelitis with potential septic joint PIPJ Cellulitis right fifth toe History of smoking Hammertoes both feet Nucleated porokeratotic skin lesions  Plan -Patient seen and  examined today. -Previous x-ray imaging and MRI imaging as well as today's MRI reviewed and discussed with patient in detail.  Appears to show osteomyelitis to the right fifth toe at the proximal phalanx and middle phalanx.  Likely septic joint at the PIPJ with corresponding surrounding cellulitis tracking from this area to the dorsal foot, no evidence of abscess.  Relatively unchanged overall compared to previous MRI imaging. -Wound culture taken today from the interspace area, no obvious openings present today to the area though. -Discussed findings with patient detail.  Clinically patient has a soft edge type digit to the right fifth toe and does have evidence of osteomyelitis and does have a previous wound that was open to the area that likely probe to bone.  Discussed with patient that bone infection is now present as well as septic joint to the PIPJ and this is most likely why the toe was so swollen and erythematous as well as painful. -Discussed treatment options in detail.  Discussed conservative and surgical attempts at correction.  Discussed conservatively patient would be required to be on IV antibiotics for a prolonged period of time about for 6 weeks followed by oral antibiotics after that time.  Discussed that this still could potentially not get rid of the infection in the toe and could subsequently need amputation still in the future.  Discussed more aggressive types of surgical procedures consisting of right fifth toe amputation.  Believe that this would definitively get rid of the infection in the toe and could prevent him from being on antibiotics for a long period of time.  Had long discussion with patient today, patient considered options. -All treatment options were discussed with the patient both conservative and surgical attempts at correction include potential risks and complications at this time patient has elected for surgical procedure consisting of right fifth toe amputation.   Discussed postoperative course in great detail consisting of between 2 to 3 weeks of partial weightbearing with heel contact and patient will need to keep dressing on after that time as well.  Patient will need to ambulate in a surgical shoe.  Orders written for surgical shoe today.  Patient has given consent for procedure, no guarantees given. -Discussed nucleated calluses to the plantar aspect of the left foot and left third toe.  Discussed that we could treat this on an outpatient basis.  We will likely also perform paring of calluses tomorrow at surgical procedure time.  Discussed long-term the patient may have to use some degree of self-care for these lesions when they do occur consisting of changes in shoe gear, orthoses, paring of calluses himself with usage of potentially over-the-counter topical solutions such as salicylic acid type treatments or Compound W, Kerasal cream. -N.p.o. order placed for tomorrow at 0800, patient can have breakfast before that time. -Patient to have surgery at some point tomorrow likely around 5:30-6 PM or slightly later depending on OR schedule/add-ons for the day.  , DPM 11/06/2020, 12:56 PM

## 2020-11-06 NOTE — H&P (Addendum)
[place]   PATIENT NAME: Edward Owens    MR#:  [id number]  DATE OF BIRTH:  1975-03-22  DATE OF ADMISSION:  11/06/2020  PRIMARY CARE PHYSICIAN: Patient, No Pcp Per (Inactive)   Patient is coming from: Home  REQUESTING/REFERRING PHYSICIAN: Ward, Layla Maw, DO  CHIEF COMPLAINT:   Chief Complaint  Patient presents with   Foot Pain    HISTORY OF PRESENT ILLNESS:  Edward Owens is a 45 y.o. African-American male with medical history significant for migraine, ongoing tobacco abuse and recent right fifth toe cellulitis and osteomyelitis, who presented to the emergency room with acute onset of worsening right fifth toe swelling and pain.  His infection started when he tried to remove a callus with a nail clipper 5 months ago.  He did not seek medical attention until 10/25 when he was seen in the ER and refused to be admitted.  He was given a prescription for doxycycline which he took with minimal improvement.  He was then seen yesterday by his primary care physician and prescribed Levaquin.  He was advised to come to the ER for IV antibiotic therapy.  He denied any fever or chills.  No nausea or vomiting or abdominal pain.  No chest pain or dyspnea or cough or wheezing.  ED Course: When he came to the ER, blood pressure was 139/88 and later 143/93 with otherwise normal vital signs.  Labs revealed unremarkable CBC.  His CMP revealed mild hyponatremia.  Blood glucose was 164 creatinine 1.2.  Lactic acid was 2.  Imaging: On 10/25 he had an MRI that showed abnormal marrow signal in the proximal and middle phalanges of the right foot with adjacent skin wound findings suspicious for acute osteomyelitis.  There was prominent subcutaneous soft tissue edema about the fifth digit. Foot x-ray right foot x-ray today showed little toe soft tissue swelling and mild degenerative changes and subluxation of the little toe PIP without significant change.  The patient was given IV vancomycin and  cefepime.  He will be admitted to a medical bed for further evaluation and management. PAST MEDICAL HISTORY:   Past Medical History:  Diagnosis Date   Migraine   -Ongoing tobacco abuse  PAST SURGICAL HISTORY:   Past Surgical History:  Procedure Laterality Date   HERNIA REPAIR     LYMPH NODE DISSECTION Left    groin    SOCIAL HISTORY:   Social History   Tobacco Use   Smoking status: Every Day    Packs/day: 0.50    Years: 15.00    Pack years: 7.50    Types: Cigarettes   Smokeless tobacco: Never  Substance Use Topics   Alcohol use: No    FAMILY HISTORY:  Positive for diabetes mellitus in his father as well as cancer.  DRUG ALLERGIES:  No Known Allergies  REVIEW OF SYSTEMS:   ROS As per history of present illness. All pertinent systems were reviewed above. Constitutional, HEENT, cardiovascular, respiratory, GI, GU, musculoskeletal, neuro, psychiatric, endocrine, integumentary and hematologic systems were reviewed and are otherwise negative/unremarkable except for positive findings mentioned above in the HPI.   MEDICATIONS AT HOME:   Prior to Admission medications   Medication Sig Start Date End Date Taking? Authorizing Provider  doxycycline (VIBRA-TABS) 100 MG tablet Take 100 mg by mouth 2 (two) times daily. 11/03/20  Yes [provider]  ibuprofen (ADVIL) 600 MG tablet Take 1 tablet (600 mg total) by mouth every 6 (six) hours as needed. 07/30/19  Yes Enid Derry, PA-C  levofloxacin (LEVAQUIN) 750 MG tablet Take 750 mg by mouth daily. 11/03/20  Yes [provider]      VITAL SIGNS:  Blood pressure (!) 143/93, pulse 85, temperature 98.3 F (36.8 C), temperature source Oral, resp. rate 16, height 5\' 9"  (1.753 m), weight 91.6 kg, SpO2 98 %.  PHYSICAL EXAMINATION:  Physical Exam  GENERAL:  45 y.o.-year-old African-American male patient lying in the bed with no acute distress.  EYES: Pupils equal, round, reactive to light and accommodation. No  scleral icterus. Extraocular muscles intact.  HEENT: Head atraumatic, normocephalic. Oropharynx and nasopharynx clear.  NECK:  Supple, no jugular venous distention. No thyroid enlargement, no tenderness.  LUNGS: Normal breath sounds bilaterally, no wheezing, rales,rhonchi or crepitation. No use of accessory muscles of respiration.  CARDIOVASCULAR: Regular rate and rhythm, S1, S2 normal. No murmurs, rubs, or gallops.  ABDOMEN: Soft, nondistended, nontender. Bowel sounds present. No organomegaly or mass.  EXTREMITIES: No pedal edema, cyanosis, or clubbing.  NEUROLOGIC: Cranial nerves II through XII are intact. Muscle strength 5/5 in all extremities. Sensation intact. Gait not checked.  PSYCHIATRIC: The patient is alert and oriented x 3.  Normal affect and good eye contact. SKIN: Right diffuse fifth toe swelling with erythema and mild discoloration with associated bony tenderness without soft tissue fluctuation.  LABORATORY PANEL:   CBC Recent Labs  Lab 11/05/20 1035  WBC 8.9  HGB 14.1  HCT 42.1  PLT 177   ------------------------------------------------------------------------------------------------------------------  Chemistries  Recent Labs  Lab 11/05/20 1035  NA 133*  K 3.6  CL 102  CO2 23  GLUCOSE 164*  BUN 12  CREATININE 1.20  CALCIUM 9.2  AST 28  ALT 33  ALKPHOS 72  BILITOT 0.8   ------------------------------------------------------------------------------------------------------------------  Cardiac Enzymes No results for input(s): TROPONINI in the last 168 hours. ------------------------------------------------------------------------------------------------------------------  RADIOLOGY:  DG Foot Complete Right  Result Date: 11/05/2020 CLINICAL DATA:  Right foot pain and cellulitis. EXAM: RIGHT FOOT COMPLETE - 3+ VIEW COMPARISON:  10/24/2020 FINDINGS: No evidence of fracture. Mild degenerative changes and subluxation is again seen involving the PIP joint of  the little toe. Little toe soft tissue swelling is seen, however there is no evidence of osteolysis or periostitis. IMPRESSION: Little toe soft tissue swelling. No radiographic evidence of osteomyelitis. Mild degenerative changes and subluxation of little toe PIP joint, without significant change. Electronically Signed   By: 10/26/2020 M.D.   On: 11/05/2020 12:04      IMPRESSION AND PLAN:  Active Problems:   Osteomyelitis of fifth toe of right foot (HCC)  1.  Cellulitis and osteomyelitis of the right fifth toe with noncompliance, secondary to clipping toe callus and infected wound. - The patient will be admitted to a medical bed. - We will continue antibiotic therapy with IV vancomycin and meropenem for severe nonpurulent cellulitis and osteomyelitis. - Pain management will be provided. - Warm compresses will be utilized. - We will follow lactic acid level - Podiatry consult will be obtained.  2.  Ongoing tobacco abuse. - He was counseled for smoking cessation and will receive further counseling here.  3.  History of migraine. - She has no current headaches.  DVT prophylaxis: Lovenox. Code Status: full code. Family Communication:  The plan of care was discussed in details with the patient (and family). I answered all questions. The patient agreed to proceed with the above mentioned plan. Further management will depend upon hospital course. Disposition Plan: Back to previous home environment Consults called:  Podiatry.  All the records are reviewed and case discussed with ED provider.  Status is: Inpatient   Remains inpatient appropriate because:Ongoing diagnostic testing needed not appropriate for outpatient work up, Unsafe d/c plan, IV treatments appropriate due to intensity of illness or inability to take PO, and Inpatient level of care appropriate due to severity of illness   Dispo: The patient is from: Home              Anticipated d/c is to: Home              Patient  currently is not medically stable to d/c.              Difficult to place patient: No  TOTAL TIME TAKING CARE OF THIS PATIENT: 55 minutes.     Hannah Beat M.D on 11/06/2020 at 2:16 AM  [facility]   From 7 PM-7 AM, contact night-coverage [email or web address]  CC: Primary care physician; Patient, No Pcp Per (Inactive)

## 2020-11-06 NOTE — Progress Notes (Signed)
Pharmacy Antibiotic Note  Edward Owens is a 45 y.o. male admitted on 11/06/2020 with cellulitis.  Pharmacy has been consulted for Meropenem and Vancomycin dosing for 7 days.  Plan: Meropenem 1 gm q8h per indication and renal fxn.  Vancomycin Pt given initial dose of Vancomycin 1 gm Vancomycin 750 mg IV Q 8 hrs.  Goal AUC 400-550. Expected AUC: 499.9 SCr used: 1.2  Pharmacy will continue to follow and adjust abx dosing when warranted.   Height: 5\' 9"  (175.3 cm) Weight: 91.6 kg (202 lb) IBW/kg (Calculated) : 70.7  Temp (24hrs), Avg:98.3 F (36.8 C), Min:98.3 F (36.8 C), Max:98.3 F (36.8 C)  Recent Labs  Lab 11/05/20 1035 11/06/20 0140 11/06/20 0155  WBC 8.9 12.7*  --   CREATININE 1.20 1.10  --   LATICACIDVEN 2.0*  --  1.0    Estimated Creatinine Clearance: 93.9 mL/min (by C-G formula based on SCr of 1.1 mg/dL).    No Known Allergies  Antimicrobials this admission: 11/6 Cefepime >> x 1 11/6 Meropenem >>  11/6 Vancomycin >>  Microbiology results: 11/6 BCx: Pending  Thank you for allowing pharmacy to be a part of this patient's care.  13/6, PharmD, [facility] 11/06/2020 4:58 AM

## 2020-11-06 NOTE — Consult Note (Signed)
Pharmacy Antibiotic Note  Edward Owens is a 45 y.o. male admitted on 11/06/2020 with  cellulitis and OM of right fifth toe . Podiatry and ID have been consulted.  Pharmacy has been consulted for vancomycin and cefepime dosing. Patient is also ordered metronidazole.   Plan:  Cefepime 2 g IV q8h  Patient received vancomycin 1.75 g this AM (~19 mg/kg)  Will start maintenance regimen of vancomycin 1.25 g IV q12h this evening --Calculated AUC: 512, Cmin 14.8 --Daily Scr per protocol --Levels at steady state as clinically indicated  Height: 5\' 9"  (175.3 cm) Weight: 91.6 kg (202 lb) IBW/kg (Calculated) : 70.7  No data recorded.  Recent Labs  Lab 11/05/20 1035 11/06/20 0140 11/06/20 0155  WBC 8.9 12.7*  --   CREATININE 1.20 1.10  --   LATICACIDVEN 2.0*  --  1.0    Estimated Creatinine Clearance: 93.9 mL/min (by C-G formula based on SCr of 1.1 mg/dL).    No Known Allergies  Antimicrobials this admission: Meropenem 11/6 >>  Cefepime 11/6 >>  Vancomycin 11/6 >>  Metronidazole 11/6 >>   Dose adjustments this admission: N/A  Microbiology results: 11/6 WCx: pending 11/6 BCx: NGTD  Thank you for allowing pharmacy to be a part of this patient's care.  13/6 11/06/2020 1:02 PM

## 2020-11-06 NOTE — Significant Event (Signed)
The nurse notified me that patient developed rash in upper and lower extremity with itching.  No difficulty swallowing or any tongue swelling or angioedema.  Patient has been receiving vancomycin and cefepime.  I discussed with pharmacist.  As per the pharmacist patient has had received vancomycin and cephalosporin IV previously during prior admissions.  At this time we have ordered Benadryl 25 mg IV 1 dose and pharmacy is planning to dose of vancomycin as a slow infusion and giving Benadryl prior to the next doses.  We will be closely observing for any recurrence of the rash or any worsening.  Midge Minium

## 2020-11-07 ENCOUNTER — Encounter
Admission: EM | Disposition: A | Discharge status: Home / Self Care | Payer: Self-pay | Source: Home / Self Care | Attending: Family Medicine

## 2020-11-07 ENCOUNTER — Inpatient Hospital Stay: Payer: Self-pay | Admitting: Anesthesiology

## 2020-11-07 DIAGNOSIS — Z716 Tobacco abuse counseling: Secondary | ICD-10-CM

## 2020-11-07 DIAGNOSIS — Z720 Tobacco use: Secondary | ICD-10-CM

## 2020-11-07 HISTORY — PX: AMPUTATION TOE: SHX6595

## 2020-11-07 LAB — CBC WITH DIFFERENTIAL/PLATELET
Abs Immature Granulocytes: 0.06 10*3/uL (ref 0.00–0.07)
Basophils Absolute: 0 10*3/uL (ref 0.0–0.1)
Basophils Relative: 0 %
Eosinophils Absolute: 0.3 10*3/uL (ref 0.0–0.5)
Eosinophils Relative: 3 %
HCT: 42 % (ref 39.0–52.0)
Hemoglobin: 13.9 g/dL (ref 13.0–17.0)
Immature Granulocytes: 1 %
Lymphocytes Relative: 11 %
Lymphs Abs: 1.2 10*3/uL (ref 0.7–4.0)
MCH: 31 pg (ref 26.0–34.0)
MCHC: 33.1 g/dL (ref 30.0–36.0)
MCV: 93.8 fL (ref 80.0–100.0)
Monocytes Absolute: 0.6 10*3/uL (ref 0.1–1.0)
Monocytes Relative: 5 %
Neutro Abs: 9.1 10*3/uL — ABNORMAL HIGH (ref 1.7–7.7)
Neutrophils Relative %: 80 %
Platelets: 175 10*3/uL (ref 150–400)
RBC: 4.48 MIL/uL (ref 4.22–5.81)
RDW: 14.3 % (ref 11.5–15.5)
WBC: 11.3 10*3/uL — ABNORMAL HIGH (ref 4.0–10.5)
nRBC: 0 % (ref 0.0–0.2)

## 2020-11-07 LAB — COMPREHENSIVE METABOLIC PANEL
ALT: 26 U/L (ref 0–44)
AST: 20 U/L (ref 15–41)
Albumin: 3.5 g/dL (ref 3.5–5.0)
Alkaline Phosphatase: 72 U/L (ref 38–126)
Anion gap: 8 (ref 5–15)
BUN: 13 mg/dL (ref 6–20)
CO2: 23 mmol/L (ref 22–32)
Calcium: 9.2 mg/dL (ref 8.9–10.3)
Chloride: 108 mmol/L (ref 98–111)
Creatinine, Ser: 1.06 mg/dL (ref 0.61–1.24)
GFR, Estimated: >60 mL/min (ref >60)
Glucose, Bld: 89 mg/dL (ref 70–99)
Potassium: 4.2 mmol/L (ref 3.5–5.1)
Sodium: 139 mmol/L (ref 135–145)
Total Bilirubin: 0.8 mg/dL (ref 0.3–1.2)
Total Protein: 7.1 g/dL (ref 6.5–8.1)

## 2020-11-07 LAB — PHOSPHORUS: Phosphorus: 2.7 mg/dL (ref 2.5–4.6)

## 2020-11-07 LAB — MAGNESIUM: Magnesium: 2 mg/dL (ref 1.7–2.4)

## 2020-11-07 SURGERY — AMPUTATION, TOE
Anesthesia: General | Site: Toe | Laterality: Right

## 2020-11-07 MED ORDER — MIDAZOLAM HCL 2 MG/2ML IJ SOLN
INTRAMUSCULAR | Status: AC
  Filled 2020-11-07: qty 2

## 2020-11-07 MED ORDER — PROMETHAZINE HCL 25 MG/ML IJ SOLN: 6.2500 mg | INTRAMUSCULAR | Status: DC | PRN

## 2020-11-07 MED ORDER — MIDAZOLAM HCL 2 MG/2ML IJ SOLN
INTRAMUSCULAR | Status: DC | PRN
  Administered 2020-11-07: 2 mg via INTRAVENOUS

## 2020-11-07 MED ORDER — FENTANYL CITRATE (PF) 100 MCG/2ML IJ SOLN: 25.0000 ug | INTRAMUSCULAR | Status: DC | PRN

## 2020-11-07 MED ORDER — ACETAMINOPHEN 500 MG PO TABS
1000.0000 mg | ORAL_TABLET | Freq: Three times a day (TID) | ORAL | Status: DC
  Administered 2020-11-07 – 2020-11-08 (×3): 1000 mg via ORAL
  Filled 2020-11-07 (×3): qty 2

## 2020-11-07 MED ORDER — LIDOCAINE HCL (CARDIAC) PF 100 MG/5ML IV SOSY
PREFILLED_SYRINGE | INTRAVENOUS | Status: DC | PRN
  Administered 2020-11-07: 100 mg via INTRAVENOUS

## 2020-11-07 MED ORDER — OXYCODONE HCL 5 MG PO TABS: 5.0000 mg | ORAL_TABLET | Freq: Once | ORAL | Status: DC | PRN

## 2020-11-07 MED ORDER — MORPHINE SULFATE (PF) 2 MG/ML IV SOLN
2.0000 mg | INTRAVENOUS | Status: DC | PRN
  Administered 2020-11-08: 2 mg via INTRAVENOUS
  Filled 2020-11-07: qty 1

## 2020-11-07 MED ORDER — FENTANYL CITRATE (PF) 100 MCG/2ML IJ SOLN
INTRAMUSCULAR | Status: DC | PRN
  Administered 2020-11-07 (×2): 50 ug via INTRAVENOUS

## 2020-11-07 MED ORDER — ACETAMINOPHEN 325 MG PO TABS: 650.0000 mg | ORAL_TABLET | Freq: Four times a day (QID) | ORAL | Status: DC | PRN

## 2020-11-07 MED ORDER — LACTATED RINGERS IV SOLN: INTRAVENOUS | Status: DC | PRN

## 2020-11-07 MED ORDER — DEXMEDETOMIDINE (PRECEDEX) IN NS 20 MCG/5ML (4 MCG/ML) IV SYRINGE
PREFILLED_SYRINGE | INTRAVENOUS | Status: DC | PRN
  Administered 2020-11-07 (×2): 8 ug via INTRAVENOUS
  Administered 2020-11-07: 4 ug via INTRAVENOUS

## 2020-11-07 MED ORDER — OXYCODONE HCL 5 MG/5ML PO SOLN: 5.0000 mg | Freq: Once | ORAL | Status: DC | PRN

## 2020-11-07 MED ORDER — FENTANYL CITRATE (PF) 100 MCG/2ML IJ SOLN
INTRAMUSCULAR | Status: AC
  Filled 2020-11-07: qty 2

## 2020-11-07 MED ORDER — PROPOFOL 10 MG/ML IV BOLUS
INTRAVENOUS | Status: DC | PRN
  Administered 2020-11-07: 50 mg via INTRAVENOUS
  Administered 2020-11-07: 150 mg via INTRAVENOUS

## 2020-11-07 MED ORDER — LIDOCAINE HCL (PF) 1 % IJ SOLN
INTRAMUSCULAR | Status: DC | PRN
  Administered 2020-11-07: 20 mL

## 2020-11-07 MED ORDER — ONDANSETRON HCL 4 MG/2ML IJ SOLN
INTRAMUSCULAR | Status: DC | PRN
  Administered 2020-11-07: 4 mg via INTRAVENOUS

## 2020-11-07 MED ORDER — PROPOFOL 10 MG/ML IV BOLUS
INTRAVENOUS | Status: AC
  Filled 2020-11-07: qty 20

## 2020-11-07 MED ORDER — ACETAMINOPHEN 10 MG/ML IV SOLN: 1000.0000 mg | Freq: Once | INTRAVENOUS | Status: DC | PRN

## 2020-11-07 MED ORDER — DEXAMETHASONE SODIUM PHOSPHATE 10 MG/ML IJ SOLN
INTRAMUSCULAR | Status: DC | PRN
  Administered 2020-11-07: 10 mg via INTRAVENOUS

## 2020-11-07 SURGICAL SUPPLY — 45 items
BLADE MED AGGRESSIVE (BLADE) ×2 IMPLANT
BLADE OSC/SAGITTAL MD 5.5X18 (BLADE) IMPLANT
BLADE SURG 15 STRL LF DISP TIS (BLADE) IMPLANT
BLADE SURG 15 STRL SS (BLADE)
BLADE SURG MINI STRL (BLADE) IMPLANT
BNDG CONFORM 2 STRL LF (GAUZE/BANDAGES/DRESSINGS) IMPLANT
BNDG ELASTIC 4X5.8 VLCR STR LF (GAUZE/BANDAGES/DRESSINGS) ×2 IMPLANT
BNDG ESMARK 4X12 TAN STRL LF (GAUZE/BANDAGES/DRESSINGS) ×2 IMPLANT
BNDG GAUZE ELAST 4 BULKY (GAUZE/BANDAGES/DRESSINGS) ×1 IMPLANT
CNTNR SPEC 2.5X3XGRAD LEK (MISCELLANEOUS) ×1
CONT SPEC 4OZ STER OR WHT (MISCELLANEOUS) ×1
CONTAINER SPEC 2.5X3XGRAD LEK (MISCELLANEOUS) ×1 IMPLANT
CUFF TOURN SGL QUICK 12 (TOURNIQUET CUFF) IMPLANT
CUFF TOURN SGL QUICK 18X4 (TOURNIQUET CUFF) ×1 IMPLANT
DRAPE FLUOR MINI C-ARM 54X84 (DRAPES) IMPLANT
DURAPREP 26ML APPLICATOR (WOUND CARE) ×2 IMPLANT
ELECT REM PT RETURN 9FT ADLT (ELECTROSURGICAL) ×2
ELECTRODE REM PT RTRN 9FT ADLT (ELECTROSURGICAL) ×1 IMPLANT
GAUZE 4X4 16PLY ~~LOC~~+RFID DBL (SPONGE) ×2 IMPLANT
GAUZE SPONGE 4X4 12PLY STRL (GAUZE/BANDAGES/DRESSINGS) ×3 IMPLANT
GAUZE XEROFORM 1X8 LF (GAUZE/BANDAGES/DRESSINGS) ×2 IMPLANT
GLOVE SURG ENC MOIS LTX SZ7 (GLOVE) ×2 IMPLANT
GLOVE SURG UNDER LTX SZ7 (GLOVE) ×2 IMPLANT
GOWN STRL REUS W/ TWL LRG LVL3 (GOWN DISPOSABLE) ×2 IMPLANT
GOWN STRL REUS W/TWL LRG LVL3 (GOWN DISPOSABLE) ×3
HANDPIECE VERSAJET DEBRIDEMENT (MISCELLANEOUS) IMPLANT
IV NS IRRIG 3000ML ARTHROMATIC (IV SOLUTION) IMPLANT
KIT TURNOVER KIT A (KITS) ×2 IMPLANT
LABEL OR SOLS (LABEL) ×1 IMPLANT
MANIFOLD NEPTUNE II (INSTRUMENTS) ×2 IMPLANT
NDL FILTER BLUNT 18X1 1/2 (NEEDLE) ×1 IMPLANT
NDL HYPO 25X1 1.5 SAFETY (NEEDLE) ×2 IMPLANT
NEEDLE FILTER BLUNT 18X 1/2SAF (NEEDLE) ×1
NEEDLE FILTER BLUNT 18X1 1/2 (NEEDLE) ×1 IMPLANT
NEEDLE HYPO 25X1 1.5 SAFETY (NEEDLE) ×4 IMPLANT
NS IRRIG 500ML POUR BTL (IV SOLUTION) ×2 IMPLANT
PACK EXTREMITY ARMC (MISCELLANEOUS) ×2 IMPLANT
STOCKINETTE STRL 6IN 960660 (GAUZE/BANDAGES/DRESSINGS) ×1 IMPLANT
SUT ETHILON 3-0 FS-10 30 BLK (SUTURE) ×4
SUT VIC AB 3-0 SH 27 (SUTURE) ×1
SUT VIC AB 3-0 SH 27X BRD (SUTURE) ×1 IMPLANT
SUTURE EHLN 3-0 FS-10 30 BLK (SUTURE) ×2 IMPLANT
SWAB CULTURE AMIES ANAERIB BLU (MISCELLANEOUS) IMPLANT
SYR 10ML LL (SYRINGE) ×2 IMPLANT
WATER STERILE IRR 500ML POUR (IV SOLUTION) ×2 IMPLANT

## 2020-11-07 NOTE — Transfer of Care (Signed)
Immediate Anesthesia Transfer of Care Note  Patient: TEVEN MITTMAN  Procedure(s) Performed: AMPUTATION TOE-5th Toe (Right: Toe)  Patient Location: PACU  Anesthesia Type:General  Level of Consciousness: drowsy  Airway & Oxygen Therapy: Patient Spontanous Breathing and Patient connected to face mask oxygen  Post-op Assessment: Report given to RN and Post -op Vital signs reviewed and stable  Post vital signs: Reviewed and stable  Last Vitals:  Vitals Value Taken Time  BP    Temp    Pulse 72 11/07/20 2033  Resp    SpO2 98 % 11/07/20 2033  Vitals shown include unvalidated device data.  Last Pain:  Vitals:   11/07/20 1621  TempSrc:   PainSc: 0-No pain         Complications: No notable events documented.

## 2020-11-07 NOTE — Op Note (Signed)
PODIATRY / FOOT AND ANKLE SURGERY OPERATIVE REPORT    SURGEON: Rosetta Posner, DPM  PRE-OPERATIVE DIAGNOSIS:  1.  Osteomyelitis right fifth toe secondary to ulceration at the medial PIPJ 2.  History of smoking  POST-OPERATIVE DIAGNOSIS: Same  PROCEDURE(S): Right fifth toe amputation  HEMOSTASIS: Right ankle tourniquet  ANESTHESIA: general  ESTIMATED BLOOD LOSS: 10 cc  FINDING(S): 1.  Osteomyelitis/septic joint right PIPJ, right fifth toe  PATHOLOGY/SPECIMEN(S): Right fifth toe wound culture, right fifth toe bone culture PIPJ, path specimen with proximal margin marked in purple ink right fifth toe  INDICATIONS:   Edward Owens is a 45 y.o. male who presents with an infection to the right fifth toe.  Patient was removing a callus type area that was occurring between his toes due to rubbing and friction between fourth and fifth toes and patient cut too deep unfortunately.  Patient was seen in the emergency room for this a few weeks ago due to concerns for infection.  Patient had x-ray imaging as well as MRI which did show osteomyelitis present as well as potentially septic joint to the PIPJ of the right fifth toe.  Patient left AGAINST MEDICAL ADVICE and was given antibiotic therapy.  Patient was seen by his PCP and sent to the hospital again due to concerns for infection to the area.  Patient had a repeat MRI as well as x-ray imaging was once again showed osteomyelitis present.  Discussed all treatment options with the patient both conservative and surgical attempts at correction clean potential risks and complications at this time patient is elected for surgical procedure consisting of right fifth toe amputation..  DESCRIPTION: After obtaining full informed written consent, the patient was brought back to the operating room and placed supine upon the operating table.  The patient received IV antibiotics prior to induction.  After obtaining adequate anesthesia, 20 cc of 1% lidocaine plain  was injected about the right fifth ray of the right block.  The patient was prepped and draped in the standard fashion.  An Esmarch bandage was used to exsanguinate the right lower extremity and the pneumatic ankle tourniquet was inflated.  Attention was directed to the right fifth toe where a racquet type of incision was made around the toe at the base of the proximal phalanx with normal extending over the dorsal lateral aspect of the fifth metatarsal phalangeal joint area.  The incision was made straight to bone and was made in such a way to excise the ulceration present at the fourth webspace area.  At this time extensor tenotomy and capsulotomy was performed at the fifth metatarsal phalangeal joint and a release was performed of the collateral and suspensory ligaments as well as plantar plate connection and flexor tendon.  The fifth digit was disarticulated and passed off the operative site.  A wound culture was taken from the area of the wound in which there was purulent discharge that was present as well as serous fluid.  A bone culture was also taken from the head of the proximal phalanx of the right fifth toe.  The right fifth toe had the proximal margin marked in purple ink and was sent off to pathology.  The surgical site was flushed with copious amounts normal sterile saline.  The flexor and extensor tendons were cut as far proximally as possible and a flush was performed once again.  Any bleeding vessels were cauterized with electrocautery.  Hemostasis appeared to be well achieved.  Deep closure was then obtained with 3-0  Vicryl and skin was then reapproximated well coapted with 3-0 nylon in a combination of simple and horizontal mattress type stitching.  The pneumatic ankle tourniquet was deflated and a prompt hyperemic response was noted all digits of the right foot, hemostasis appeared to be well achieved.  A postoperative dressing was applied consisting of Xeroform followed by 4 x 4 gauze, Kerlix,  Ace wrap.  Patient tolerated the procedure and anesthesia well transferred to recovery room vital signs stable vascular status intact all toes the right foot.  Following a period of postoperative monitoring patient be discharged back to the inpatient room with the appropriate orders and follow-up instructions.  We will likely see patient tomorrow for dressing change and will probably sign off at that point for patient to go home on oral antibiotics based on culture results.  COMPLICATIONS: None  CONDITION: Good, stable  Rosetta Posner, DPM

## 2020-11-07 NOTE — Anesthesia Postprocedure Evaluation (Signed)
Anesthesia Post Note  Patient: Edward Owens  Procedure(s) Performed: AMPUTATION TOE-5th Toe (Right: Toe)  Patient location during evaluation: PACU Anesthesia Type: General Level of consciousness: awake and alert Pain management: pain level controlled Vital Signs Assessment: post-procedure vital signs reviewed and stable Respiratory status: spontaneous breathing, nonlabored ventilation and respiratory function stable Cardiovascular status: blood pressure returned to baseline and stable Postop Assessment: no apparent nausea or vomiting Anesthetic complications: no   No notable events documented.   Last Vitals:  Vitals:   11/07/20 2100 11/07/20 2133  BP: 126/88 140/88  Pulse: 89 74  Resp: (!) 24 17  Temp: 37.1 C 36.5 C  SpO2: 96% 99%    Last Pain:  Vitals:   11/07/20 2100  TempSrc:   PainSc: 0-No pain                 Foye Deer

## 2020-11-07 NOTE — Anesthesia Preprocedure Evaluation (Addendum)
Anesthesia Evaluation  Patient identified by MRN, date of birth, ID band Patient awake    Reviewed: Allergy & Precautions, NPO status , Patient's Chart, lab work & pertinent test results  Airway Mallampati: I  TM Distance: >3 FB Neck ROM: Full  Mouth opening: Limited Mouth Opening  Dental no notable dental hx.    Pulmonary Current Smoker and Patient abstained from smoking.,    Pulmonary exam normal        Cardiovascular Exercise Tolerance: Good negative cardio ROS Normal cardiovascular exam     Neuro/Psych  Headaches, negative psych ROS   GI/Hepatic negative GI ROS, Neg liver ROS,   Endo/Other  negative endocrine ROS  Renal/GU negative Renal ROS  negative genitourinary   Musculoskeletal negative musculoskeletal ROS (+)   Abdominal Normal abdominal exam  (+)   Peds negative pediatric ROS (+)  Hematology negative hematology ROS (+)   Anesthesia Other Findings Osteomyelitis of fifth toe of right foot     Reproductive/Obstetrics negative OB ROS                            Anesthesia Physical Anesthesia Plan  ASA: 2  Anesthesia Plan: General   Post-op Pain Management:    Induction: Intravenous  PONV Risk Score and Plan: TIVA  Airway Management Planned: Nasal Cannula and Natural Airway  Additional Equipment:   Intra-op Plan:   Post-operative Plan:   Informed Consent: I have reviewed the patients History and Physical, chart, labs and discussed the procedure including the risks, benefits and alternatives for the proposed anesthesia with the patient or authorized representative who has indicated his/her understanding and acceptance.     Dental advisory given  Plan Discussed with: CRNA and Anesthesiologist  Anesthesia Plan Comments:         Anesthesia Quick Evaluation

## 2020-11-07 NOTE — TOC Progression Note (Signed)
Transition of Care [facility]) - Progression Note    Patient Details  Name: Edward Owens MRN: [id number] Date of Birth: July 04, 1975  [facility]) CM/SW Contact  Marlowe Sax, RN Phone Number: 11/07/2020, 9:44 AM  Clinical Narrative:   Patient to have amputation today, no insurance will possibly need HH and Medication assistance, TOC to follow         Expected Discharge Plan and Services                                                 Social Determinants of Health (SDOH) Interventions    Readmission Risk Interventions No flowsheet data found.

## 2020-11-07 NOTE — Anesthesia Procedure Notes (Signed)
Procedure Name: LMA Insertion Date/Time: 11/07/2020 7:50 PM Performed by: Hezzie Bump, CRNA Pre-anesthesia Checklist: Patient identified, Patient being monitored, Timeout performed, Emergency Drugs available and Suction available Patient Re-evaluated:Patient Re-evaluated prior to induction Oxygen Delivery Method: Circle system utilized Preoxygenation: Pre-oxygenation with 100% oxygen Induction Type: IV induction Ventilation: Mask ventilation without difficulty LMA: LMA inserted LMA Size: 4.0 Tube type: Oral Number of attempts: 1 Placement Confirmation: positive ETCO2 and breath sounds checked- equal and bilateral Tube secured with: Tape Dental Injury: Teeth and Oropharynx as per pre-operative assessment

## 2020-11-07 NOTE — Consult Note (Addendum)
Pharmacy Antibiotic Note  Edward Owens is a 45 y.o. male admitted on 11/06/2020 with  cellulitis and OM of right fifth toe . Podiatry and ID have been consulted.  Pharmacy has been consulted for vancomycin and cefepime dosing. Patient is also ordered metronidazole.   MRI concerning for osteomyelitis with likely septic joint. Of note, on 11/6 PM, pt developed rash, possibly infusion related reaction. Giving vancomycin 1250 mg over 150 minutes and Benadryl prior to infusion.  Vancomycin has been discontinued.   Plan: Continue cefepime 2 g IV q8h  Monitor renal function, any new s/sx of rash, clinical course and LOT.  Height: 5\' 9"  (175.3 cm) Weight: 91.6 kg (202 lb) IBW/kg (Calculated) : 70.7  Temp (24hrs), Avg:98.3 F (36.8 C), Min:97.8 F (36.6 C), Max:98.5 F (36.9 C)  Recent Labs  Lab 11/05/20 1035 11/06/20 0140 11/06/20 0155 11/07/20 0543  WBC 8.9 12.7*  --  11.3*  CREATININE 1.20 1.10  --  1.06  LATICACIDVEN 2.0*  --  1.0  --      Estimated Creatinine Clearance: 97.4 mL/min (by C-G formula based on SCr of 1.06 mg/dL).    No Known Allergies  Antimicrobials this admission: Meropenem 11/6 >> 11/6 Cefepime 11/6 >>  Vancomycin 11/6 >>  Metronidazole 11/6 >>   Dose adjustments this admission: N/A  Microbiology results: 11/6 WCx: moderate GPR, rare GPCs 11/6 BCx: NGTD  Thank you for allowing pharmacy to be a part of this patient's care.  13/6, PharmD Pharmacy Resident  11/07/2020 10:41 AM

## 2020-11-07 NOTE — H&P (Signed)
HISTORY AND PHYSICAL INTERVAL NOTE:  11/07/2020  7:24 PM  Edward Owens  has presented today for surgery, with the diagnosis of 5th Toe osteomyelitis.  The various methods of treatment have been discussed with the patient.  No guarantees were given.  After consideration of risks, benefits and other options for treatment, the patient has consented to surgery.  I have reviewed the patients' chart and labs.    PROCEDURE: RIGHT 5TH TOE AMPUTATION   A history and physical examination was performed in the hospital.  The patient was reexamined.  There have been no changes to this history and physical examination.  Rosetta Posner, DPM

## 2020-11-07 NOTE — Progress Notes (Signed)
PROGRESS NOTE    Edward Owens  XFG:[id number] DOB: May 30, 1975 DOA: 11/06/2020 PCP: Patient, No Pcp Per (Inactive)  Chief Complaint  Patient presents with   Foot Pain    Brief Narrative:  45 y.o. African-American male with medical history significant for migraine, ongoing tobacco abuse and recent right fifth toe cellulitis and osteomyelitis.  He's returned to the ED due to recommendation from his PCP after initially leaving the ED on 10/25 AMA.    Assessment & Plan:   Active Problems:   Osteomyelitis of fifth toe of right foot ([place])  1.  Cellulitis and osteomyelitis of the right fifth toe -- hemodynamically stable, will hold abx until decision regarding procedure -- will repeat MRI R foot -> with acute osteo of proximal and middle phalanges of small toe, likely septic proximal IP joint - surrounding cellulitis, tracking back into dorsum of foot - possible early local myositis -- CRP, ESR not that elevated -- palpable DP pulse -- podiatry c/s, planning for amputation -- given plan for amputation, will hold off on ID c/s - ID recommending d/c vanc for now with negative cx from 10/25  # Rash - seems to have improved after benadryl  - unclear etiology, has received and tolerated vanc/cefepime before - vanc now d/c'd  2.  Ongoing tobacco abuse. - nicotine patch, nicotine gum -- discussed importance of smoking cessation especially with regards to wound healing/infections, continued discussion today -- he's open to idea of quitting smoking - discussed option of meds, nicotine patch working well right now, will plan to prescribe NRT at discharge - can consider other meds outpatient if needs.  4 min spent discussing.  # Nucleated Porokeratotic Skin Lesions -- podiatry planning to pair in OR  3.  History of migraine. - no current headaches.  DVT prophylaxis: lovenox Code Status: full Family Communication: none at bedside Disposition:   Status is: Inpatient  Remains  inpatient appropriate because: need for ID and podiatry eval       Consultants:  ID podiatry  Procedures:  none  Antimicrobials:  Anti-infectives (From admission, onward)    Start     Dose/Rate Route Frequency Ordered Stop   11/06/20 2200  vancomycin (VANCOREADY) IVPB 1250 mg/250 mL  Status:  Discontinued        1,250 mg 100 mL/hr over 150 Minutes Intravenous Every 12 hours 11/06/20 1301 11/07/20 1321   11/06/20 1400  ceFEPIme (MAXIPIME) 2 g in sodium chloride 0.9 % 100 mL IVPB        2 g 200 mL/hr over 30 Minutes Intravenous Every 8 hours 11/06/20 1259     11/06/20 1300  metroNIDAZOLE (FLAGYL) tablet 500 mg        500 mg Oral Every 12 hours 11/06/20 1255     11/06/20 0500  vancomycin (VANCOREADY) IVPB 750 mg/150 mL  Status:  Discontinued        750 mg 150 mL/hr over 60 Minutes Intravenous Every 8 hours 11/06/20 0317 11/06/20 1101   11/06/20 0400  meropenem (MERREM) 1 g in sodium chloride 0.9 % 100 mL IVPB  Status:  Discontinued        1 g 200 mL/hr over 30 Minutes Intravenous Every 8 hours 11/06/20 0313 11/06/20 1101   11/06/20 0215  vancomycin (VANCOCIN) IVPB 1000 mg/200 mL premix  Status:  Discontinued        1,000 mg 200 mL/hr over 60 Minutes Intravenous  Once 11/06/20 0209 11/06/20 0238   11/06/20 0215  meropenem (MERREM) 1 g  in sodium chloride 0.9 % 100 mL IVPB  Status:  Discontinued        1 g 200 mL/hr over 30 Minutes Intravenous  Once 11/06/20 0209 11/06/20 0312   11/06/20 0200  vancomycin (VANCOCIN) IVPB 1000 mg/200 mL premix        1,000 mg 200 mL/hr over 60 Minutes Intravenous  Once 11/06/20 0154 11/06/20 0239   11/06/20 0200  ceFEPIme (MAXIPIME) 2 g in sodium chloride 0.9 % 100 mL IVPB        2 g 200 mL/hr over 30 Minutes Intravenous  Once 11/06/20 0154 11/06/20 0212          Subjective: Anxious, appreciative  Objective: Vitals:   11/06/20 2039 11/07/20 0440 11/07/20 0857 11/07/20 1518  BP: 132/90 119/78 132/85 137/80  Pulse: 74 71 92 72  Resp: $Remo'20  20 14 16  'RKynW$ Temp: 98.4 F (36.9 C) 97.8 F (36.6 C) 98.5 F (36.9 C) 98 F (36.7 C)  TempSrc:      SpO2: 98% 100% 100% 100%  Weight:      Height:        Intake/Output Summary (Last 24 hours) at 11/07/2020 1527 Last data filed at 11/07/2020 0455 Gross per 24 hour  Intake 485.26 ml  Output 400 ml  Net 85.26 ml   Filed Weights   11/05/20 1009 11/05/20 1020  Weight: 91.6 kg 91.6 kg    Examination:  General: No acute distress. Cardiovascular: RRR Lungs: unlabored Abdomen: Soft, nontender, nondistended Neurological: Alert and oriented 3. Moves all extremities 4 . Cranial nerves II through XII grossly intact. Skin: Warm and dry. No rashes or lesions. Extremities: L 5th toe swollen     Data Reviewed: I have personally reviewed following labs and imaging studies  CBC: Recent Labs  Lab 11/05/20 1035 11/06/20 0140 11/07/20 0543  WBC 8.9 12.7* 11.3*  NEUTROABS 6.0  --  9.1*  HGB 14.1 14.0 13.9  HCT 42.1 41.2 42.0  MCV 94.4 94.9 93.8  PLT 177 174 427    Basic Metabolic Panel: Recent Labs  Lab 11/05/20 1035 11/06/20 0140 11/07/20 0543  NA 133* 139 139  K 3.6 4.1 4.2  CL 102 107 108  CO2 $Re'23 23 23  'geF$ GLUCOSE 164* 102* 89  BUN $Re'12 15 13  'xUf$ CREATININE 1.20 1.10 1.06  CALCIUM 9.2 9.4 9.2  MG  --   --  2.0  PHOS  --   --  2.7    GFR: Estimated Creatinine Clearance: 97.4 mL/min (by C-G formula based on SCr of 1.06 mg/dL).  Liver Function Tests: Recent Labs  Lab 11/05/20 1035 11/07/20 0543  AST 28 20  ALT 33 26  ALKPHOS 72 72  BILITOT 0.8 0.8  PROT 7.9 7.1  ALBUMIN 4.2 3.5    CBG: No results for input(s): GLUCAP in the last 168 hours.   Recent Results (from the past 240 hour(s))  Culture, blood (Routine X 2) w Reflex to ID Panel     Status: None (Preliminary result)   Collection Time: 11/06/20  1:40 AM   Specimen: BLOOD  Result Value Ref Range Status   Specimen Description BLOOD RFA  Final   Special Requests   Final    BOTTLES DRAWN AEROBIC AND  ANAEROBIC Blood Culture adequate volume   Culture   Final    NO GROWTH 1 DAY Performed at [facility], [address] [place], [place] [address]    Report Status PENDING  Incomplete  Culture, blood (Routine X 2)  w Reflex to ID Panel     Status: None (Preliminary result)   Collection Time: 11/06/20  1:40 AM   Specimen: BLOOD  Result Value Ref Range Status   Specimen Description BLOOD RFA  Final   Special Requests   Final    BOTTLES DRAWN AEROBIC AND ANAEROBIC Blood Culture results may not be optimal due to an inadequate volume of blood received in culture bottles   Culture   Final    NO GROWTH 1 DAY Performed at [facility], [address] [place], [place] [address]    Report Status PENDING  Incomplete  Resp Panel by RT-PCR (Flu Nancee Brownrigg&B, Covid) Nasopharyngeal Swab     Status: None   Collection Time: 11/06/20  1:55 AM   Specimen: Nasopharyngeal Swab; Nasopharyngeal(NP) swabs in vial transport medium  Result Value Ref Range Status   SARS Coronavirus 2 by RT PCR NEGATIVE NEGATIVE Final    Comment: (NOTE) SARS-CoV-2 target nucleic acids are NOT DETECTED.  The SARS-CoV-2 RNA is generally detectable in upper respiratory specimens during the acute phase of infection. The lowest concentration of SARS-CoV-2 viral copies this assay can detect is 138 copies/mL. Onnie Hatchel negative result does not preclude SARS-Cov-2 infection and should not be used as the sole basis for treatment or other patient management decisions. Jacquilyn Seldon negative result may occur with  improper specimen collection/handling, submission of specimen other than nasopharyngeal swab, presence of viral mutation(s) within the areas targeted by this assay, and inadequate number of viral copies(<138 copies/mL). Gedalia Mcmillon negative result must be combined with clinical observations, patient history, and epidemiological information. The expected result is Negative.  Fact Sheet for Patients:   BloggerCourse.com  Fact Sheet for Healthcare Providers:  SeriousBroker.it  This test is no t yet approved or cleared by the [place] FDA and  has been authorized for detection and/or diagnosis of SARS-CoV-2 by FDA under an Emergency Use Authorization (EUA). This EUA will remain  in effect (meaning this test can be used) for the duration of the COVID-19 declaration under Section 564(b)(1) of the Act, 21 U.S.C.section 360bbb-3(b)(1), unless the authorization is terminated  or revoked sooner.       Influenza Kasi Lasky by PCR NEGATIVE NEGATIVE Final   Influenza B by PCR NEGATIVE NEGATIVE Final    Comment: (NOTE) The Xpert Xpress SARS-CoV-2/FLU/RSV plus assay is intended as an aid in the diagnosis of influenza from Nasopharyngeal swab specimens and should not be used as Jeyren Danowski sole basis for treatment. Nasal washings and aspirates are unacceptable for Xpert Xpress SARS-CoV-2/FLU/RSV testing.  Fact Sheet for Patients: BloggerCourse.com  Fact Sheet for Healthcare Providers: SeriousBroker.it  This test is not yet approved or cleared by the [place] FDA and has been authorized for detection and/or diagnosis of SARS-CoV-2 by FDA under an Emergency Use Authorization (EUA). This EUA will remain in effect (meaning this test can be used) for the duration of the COVID-19 declaration under Section 564(b)(1) of the Act, 21 U.S.C. section 360bbb-3(b)(1), unless the authorization is terminated or revoked.  Performed at [facility], [address] [place], [place] [address]   Aerobic/Anaerobic Culture w Gram Stain (surgical/deep wound)     Status: None (Preliminary result)   Collection Time: 11/06/20  3:24 PM   Specimen: Toe; Wound  Result Value Ref Range Status   Specimen Description   Final    TOE R Performed at [facility], [address], [place], [place] [address]     Special Requests   Final  NONE Performed at [facility], [address] [place], [place] [address]    Gram Stain   Final    NO WBC SEEN MODERATE GRAM POSITIVE RODS RARE GRAM POSITIVE COCCI    Culture   Final    CULTURE REINCUBATED FOR BETTER GROWTH Performed at [facility], [place] [address], [place], [place] [address]    Report Status PENDING  Incomplete         Radiology Studies: MR FOOT RIGHT WO CONTRAST  Result Date: 11/06/2020 CLINICAL DATA:  Worsening right fifth toe swelling and pain. Osteomyelitis of the fifth toe shown on MRI from 10/25/2020. EXAM: MRI OF THE RIGHT FOREFOOT WITHOUT CONTRAST TECHNIQUE: Multiplanar, multisequence MR imaging of the right forefoot was performed. No intravenous contrast was administered. COMPARISON:  10/25/2020 and radiographs from 11/05/2020 FINDINGS: Despite efforts by the technologist and patient, motion artifact is present on today's exam and could not be eliminated. This reduces exam sensitivity and specificity. Bones/Joint/Cartilage Diffuse abnormal osseous edema in the proximal and middle phalanges of the small toe with an effusion the proximal interphalangeal joint (image 5, series 9) similar to that shown on 10/25/2020. Appearance is compatible with acute osteomyelitis and quite likely septic proximal interphalangeal joint. Ligaments Lisfranc ligament appears intact. Muscles and Tendons Trace edema along the interosseous musculature between the distal fourth and fifth metatarsals, mildly increased from 10/25/2020 and potentially reflecting local myositis. Soft tissues Substantial cellulitis in the fifth toe. Edema tracks back along the dorsal forefoot, potentially also reflecting cellulitis. This is overall similar to prior. IMPRESSION: 1. Acute osteomyelitis of the proximal and middle phalanges of the small toe, likely septic proximal interphalangeal joint. Surrounding cellulitis, tracking back into the dorsum of foot. The  only substantial change from 10/25/2020 is that there is slightly greater edema tracking along the interosseous musculature between the distal fourth and fifth metatarsals which may reflect early local myositis. Electronically Signed   By: Van Clines M.D.   On: 11/06/2020 12:16        Scheduled Meds:  enoxaparin (LOVENOX) injection  0.5 mg/kg Subcutaneous Q24H   metroNIDAZOLE  500 mg Oral Q12H   nicotine  21 mg Transdermal Daily   Continuous Infusions:  ceFEPime (MAXIPIME) IV 2 g (11/07/20 1329)     LOS: 1 day    Time spent: over 30 min    Fayrene Helper, MD [facility]   To contact the attending provider between 7A-7P or the covering provider during after hours 7P-7A, please log into the web site [email or web address] and access using universal [place] password for that web site. If you do not have the password, please call the hospital operator.  11/07/2020, 3:27 PM

## 2020-11-08 ENCOUNTER — Encounter: Payer: Self-pay | Admitting: Podiatry

## 2020-11-08 LAB — CBC WITH DIFFERENTIAL/PLATELET
Abs Immature Granulocytes: 0.06 10*3/uL (ref 0.00–0.07)
Basophils Absolute: 0 10*3/uL (ref 0.0–0.1)
Basophils Relative: 0 %
Eosinophils Absolute: 0 10*3/uL (ref 0.0–0.5)
Eosinophils Relative: 0 %
HCT: 41.8 % (ref 39.0–52.0)
Hemoglobin: 14 g/dL (ref 13.0–17.0)
Immature Granulocytes: 0 %
Lymphocytes Relative: 5 %
Lymphs Abs: 0.7 10*3/uL (ref 0.7–4.0)
MCH: 31.3 pg (ref 26.0–34.0)
MCHC: 33.5 g/dL (ref 30.0–36.0)
MCV: 93.3 fL (ref 80.0–100.0)
Monocytes Absolute: 0.2 10*3/uL (ref 0.1–1.0)
Monocytes Relative: 1 %
Neutro Abs: 13.2 10*3/uL — ABNORMAL HIGH (ref 1.7–7.7)
Neutrophils Relative %: 94 %
Platelets: 186 10*3/uL (ref 150–400)
RBC: 4.48 MIL/uL (ref 4.22–5.81)
RDW: 14.1 % (ref 11.5–15.5)
WBC: 14.2 10*3/uL — ABNORMAL HIGH (ref 4.0–10.5)
nRBC: 0 % (ref 0.0–0.2)

## 2020-11-08 LAB — COMPREHENSIVE METABOLIC PANEL
ALT: 27 U/L (ref 0–44)
AST: 25 U/L (ref 15–41)
Albumin: 4.1 g/dL (ref 3.5–5.0)
Alkaline Phosphatase: 73 U/L (ref 38–126)
Anion gap: 12 (ref 5–15)
BUN: 13 mg/dL (ref 6–20)
CO2: 21 mmol/L — ABNORMAL LOW (ref 22–32)
Calcium: 9.6 mg/dL (ref 8.9–10.3)
Chloride: 104 mmol/L (ref 98–111)
Creatinine, Ser: 1.12 mg/dL (ref 0.61–1.24)
GFR, Estimated: >60 mL/min (ref >60)
Glucose, Bld: 165 mg/dL — ABNORMAL HIGH (ref 70–99)
Potassium: 3.9 mmol/L (ref 3.5–5.1)
Sodium: 137 mmol/L (ref 135–145)
Total Bilirubin: 0.7 mg/dL (ref 0.3–1.2)
Total Protein: 7.5 g/dL (ref 6.5–8.1)

## 2020-11-08 LAB — MAGNESIUM: Magnesium: 2.1 mg/dL (ref 1.7–2.4)

## 2020-11-08 LAB — PHOSPHORUS: Phosphorus: 3.3 mg/dL (ref 2.5–4.6)

## 2020-11-08 MED ORDER — IBUPROFEN 400 MG PO TABS: 400.0000 mg | ORAL_TABLET | Freq: Four times a day (QID) | ORAL | Status: DC | PRN

## 2020-11-08 MED ORDER — OXYCODONE HCL 5 MG PO TABS: 5.0000 mg | ORAL_TABLET | Freq: Four times a day (QID) | ORAL | 0 refills | Status: AC | PRN

## 2020-11-08 MED ORDER — OXYCODONE HCL 5 MG PO TABS
10.0000 mg | ORAL_TABLET | ORAL | Status: DC | PRN
  Filled 2020-11-08: qty 2

## 2020-11-08 MED ORDER — AMOXICILLIN-POT CLAVULANATE 875-125 MG PO TABS: 1.0000 | ORAL_TABLET | Freq: Two times a day (BID) | ORAL | 0 refills | Status: AC

## 2020-11-08 MED ORDER — NICOTINE POLACRILEX 2 MG MT GUM: 2.0000 mg | CHEWING_GUM | OROMUCOSAL | 1 refills | Status: AC | PRN

## 2020-11-08 MED ORDER — OXYCODONE HCL 5 MG PO TABS
5.0000 mg | ORAL_TABLET | ORAL | Status: DC | PRN
  Administered 2020-11-08: 5 mg via ORAL

## 2020-11-08 MED ORDER — NICOTINE 21 MG/24HR TD PT24: 21.0000 mg | MEDICATED_PATCH | Freq: Every day | TRANSDERMAL | 1 refills | Status: AC

## 2020-11-08 NOTE — [place] Notes (Signed)
Discharge Note: Reviewed discharge instructions with pt.  PT verbalized understanding. Pt discharged with post op wedge shoe, rolling walker, and personal belongings. IV cath intact upon removal. Staff wheeled pt out. Pt transported to home via family private vehicle.

## 2020-11-08 NOTE — Discharge Instructions (Signed)
Keep dressings clean, dry, and intact to the right foot.  Continue with partial weightbearing with heel contact in a surgical shoe.  Try to avoid any weight being put on the front part of the foot.  Try to keep weightbearing to a minimum until incision heals, will likely take around 2 to 3 weeks for the incision to heal.  Take antibiotics as prescribed until gone.  Take pain medication as prescribed as well, you can supplement your pain medication with ibuprofen or Tylenol.

## 2020-11-08 NOTE — TOC Progression Note (Incomplete)
Transition of Care [facility]) - Progression Note    Patient Details  Name: Edward Owens MRN: [id number] Date of Birth: 1975/08/07  Transition of Care [facility]) CM/SW [place], RN Phone Number: 11/08/2020, 1:43 PM  Clinical Narrative:    Met with the patient in the room to discuss DC plan and needs He lives at home with his girlfriend He normally walks independently but since the amputation will need to have a rolling walker for stability, I called and spoke to [place] with [employer], it will be delivered to the room prior to DC thru the charity program, the patient will need to obtain medication at [facility], He goes to [facility] care clinic for PCP,     Expected Discharge Plan: Home/Self Care Barriers to Discharge: Continued Medical Work up  Expected Discharge Plan and Services Expected Discharge Plan: Home/Self Care   Discharge Planning Services: CM Consult, Medication Assistance   Living arrangements for the past 2 months: Single Family Home                 DME Arranged: Walker rolling DME Agency: [employer] Date DME Agency Contacted: 11/08/20 Time DME Agency Contacted: 1341 Representative spoke with at DME Agency: Freda Munro [facility] Arranged: NA           Social Determinants of Health ([place]) Interventions    Readmission Risk Interventions No flowsheet data found.

## 2020-11-08 NOTE — Evaluation (Signed)
Physical Therapy Evaluation Patient Details Name: Edward Owens MRN: [id number] DOB: 12-03-1975 Today's Date: 11/08/2020  History of Present Illness  Pt is a 45 y.o. African-American male with medical history significant for migraine, ongoing tobacco abuse and recent right fifth toe cellulitis and osteomyelitis, who presented to the emergency room with acute onset of worsening right fifth toe swelling and pain. Pt diagnosed with osteomyelitis right fifth toe secondary to ulceration at the medial PIPJ and is s/p right fifth toe amputation.   Clinical Impression  Pt was pleasant and motivated to participate during the session and put forth good effort throughout. Pt education provided on proper sequencing for WB compliance with tranfers, gait, and stairs. Pt was abel to demonstrate excellent control, stability, and carry over of proper sequencing throughout. Pt will not require skilled PT services at discharge. Will complete PT orders at this time but will reassess pt pending change in status upon receipt of new PT orders.      Recommendations for follow up therapy are one component of a multi-disciplinary discharge planning process, led by the attending physician.  Recommendations may be updated based on patient status, additional functional criteria and insurance authorization.  Follow Up Recommendations No PT follow up    Assistance Recommended at Discharge Intermittent Supervision/Assistance  Functional Status Assessment Patient has had a recent decline in their functional status and demonstrates the ability to make significant improvements in function in a reasonable and predictable amount of time.  Equipment Recommendations  Rolling walker (2 wheels)    Recommendations for Other Services       Precautions / Restrictions Restrictions Weight Bearing Restrictions: Yes RLE Weight Bearing: Partial weight bearing Other Position/Activity Restrictions: RLE PWB to heel only with ortho wedge  shoe for functional distances only      Mobility  Bed Mobility               General bed mobility comments: NT, in chair    Transfers Overall transfer level: Modified independent Equipment used: Rolling walker (2 wheels)                    Ambulation/Gait Ambulation/Gait assistance: Modified independent (Device/Increase time) Gait Distance (Feet): 25 Feet Assistive device: Rolling walker (2 wheels) Gait Pattern/deviations: Step-to pattern Gait velocity: decreased     General Gait Details: Min verbal and visual cues for PWB sequencing through the R heel  Stairs Stairs: Yes Stairs assistance: Modified independent (Device/Increase time) Stair Management: Two rails;Step to pattern;Forwards;Alternating pattern Number of Stairs: 4 General stair comments: Min verbal and visual cues for WB compliance with pt able to demonstrate good carry over  Wheelchair Mobility    Modified Rankin (Stroke Patients Only)       Balance Overall balance assessment: No apparent balance deficits (not formally assessed)                                           Pertinent Vitals/Pain Pain Assessment: 0-10 Pain Score: 5  Pain Location: R foot Pain Descriptors / Indicators: Sore Pain Intervention(s): Premedicated before session;Repositioned;Monitored during session    Home Living Family/patient expects to be discharged to:: Private residence Living Arrangements: Non-relatives/Friends (Girlfriend) Available Help at Discharge: Friend(s);Available 24 hours/day Type of Home: House Home Access: Stairs to enter Entrance Stairs-Rails: Can reach both;Right;Left Entrance Stairs-Number of Steps: 3   Home Layout: One level Home Equipment: None  Prior Function Prior Level of Function : Independent/Modified Independent             Mobility Comments: Independent ambulation without AD, comunity distacnes, full time job at [employer] with ambulation throughout  day ADLs Comments: Independent, no fall history     Hand Dominance        Extremity/Trunk Assessment   Upper Extremity Assessment Upper Extremity Assessment: Overall WFL for tasks assessed    Lower Extremity Assessment Lower Extremity Assessment: Overall WFL for tasks assessed       Communication   Communication: No difficulties  Cognition Arousal/Alertness: Awake/alert Behavior During Therapy: WFL for tasks assessed/performed Overall Cognitive Status: Within Functional Limits for tasks assessed                                          General Comments      Exercises Other Exercises Other Exercises: Pt education providing on limiting transfers and ambulation to necessary functional distances only   Assessment/Plan    PT Assessment Patient does not need any further PT services  PT Problem List         PT Treatment Interventions      PT Goals (Current goals can be found in the Care Plan section)  Acute Rehab PT Goals PT Goal Formulation: All assessment and education complete, DC therapy    Frequency     Barriers to discharge        Co-evaluation               AM-PAC PT "6 Clicks" Mobility  Outcome Measure Help needed turning from your back to your side while in a flat bed without using bedrails?: None Help needed moving from lying on your back to sitting on the side of a flat bed without using bedrails?: None Help needed moving to and from a bed to a chair (including a wheelchair)?: None Help needed standing up from a chair using your arms (e.g., wheelchair or bedside chair)?: None Help needed to walk in hospital room?: None Help needed climbing 3-5 steps with a railing? : None 6 Click Score: 24    End of Session Equipment Utilized During Treatment: Gait belt Activity Tolerance: Patient tolerated treatment well Patient left: in chair;with call bell/phone within reach Nurse Communication: Mobility status;Weight bearing  status PT Visit Diagnosis: Difficulty in walking, not elsewhere classified (R26.2)    Time: 0092-3300 PT Time Calculation (min) (ACUTE ONLY): 25 min   Charges:              Hildred Alamin SPT 11/08/20, 4:11 PM

## 2020-11-08 NOTE — [place] Notes (Signed)
Daily Progress Note   Subjective  - 1 Day Post-Op  Status post partial right fifth toe amputation.  Doing well.  Objective Vitals:   11/07/20 2319 11/08/20 0457 11/08/20 0725 11/08/20 1059  BP: 132/75 124/86 129/68 134/80  Pulse: 78 93 79 80  Resp: 17 17 16 16   Temp: 97.7 F (36.5 C) 97.9 F (36.6 C) 97.8 F (36.6 C) 98.2 F (36.8 C)  TempSrc:   Oral   SpO2: 100% 98% 99% 99%  Weight:      Height:        Physical Exam: Wound is well coapted.  No stress to the incision at all.  No signs of residual infection.  No drainage.  No erythema.  Laboratory CBC    Component Value Date/Time   WBC 14.2 (H) 11/08/2020 0639   HGB 14.0 11/08/2020 0639   HGB 14.6 03/26/2014 1104   HCT 41.8 11/08/2020 0639   HCT 44.8 03/26/2014 1104   PLT 186 11/08/2020 0639   PLT 151 03/26/2014 1104    BMET    Component Value Date/Time   NA 137 11/08/2020 0639   NA 139 03/26/2014 1104   K 3.9 11/08/2020 0639   K 4.0 03/26/2014 1104   CL 104 11/08/2020 0639   CL 108 03/26/2014 1104   CO2 21 (L) 11/08/2020 0639   CO2 24 03/26/2014 1104   GLUCOSE 165 (H) 11/08/2020 0639   GLUCOSE 91 03/26/2014 1104   BUN 13 11/08/2020 0639   BUN 10 03/26/2014 1104   CREATININE 1.12 11/08/2020 0639   CREATININE 1.12 03/26/2014 1104   CALCIUM 9.6 11/08/2020 0639   CALCIUM 9.5 03/26/2014 1104   GFRNONAA >60 11/08/2020 0639   GFRNONAA >60 03/26/2014 1104   GFRAA >60 04/09/2019 0919   GFRAA >60 03/26/2014 1104    Assessment/Planning: Status post fifth toe amputation  Dressing change.  Can keep dressing clean dry and intact. Patient is using a walker.  He feels comfortable with this.  Maintain minimal weightbearing to the foot.  Okay to use heel weightbearing for transfer and minimal ambulation as needed. Wound culture with gram-positive cocci at this point.  Recommend oral antibiotics.  I would recommend Augmentin.  We can change as needed based on cultures outpatient. Follow-up with Dr. 03/28/2014 in 1  week. Okay to DC from podiatry standpoint.  Excell Seltzer A  11/08/2020, 1:46 PM

## 2020-11-08 NOTE — Discharge Summary (Signed)
Physician Discharge Summary  Edward Owens HRC:[id number] DOB: 12-Feb-1975 DOA: 11/06/2020  PCP: Patient, No Pcp Per (Inactive)  Admit date: 11/06/2020 Discharge date: 11/08/2020  Time spent: 40 minutes  Recommendations for Outpatient Follow-up:  Follow outpatient CBC/CMP Follow with podiatry outpatient Follow final surgical cultures and pathology Follow with PCP outpatient   Encourage smoking cessation Follow allergic reaction outpatient Elevated BG this morning on day of discharge, suspect related to steroids, follow a1c outpatient  Discharge Diagnoses:  Active Problems:   Osteomyelitis of fifth toe of right foot ([place])   Tobacco abuse counseling   Tobacco abuse   Discharge Condition: stable  Diet recommendation: heart healthy  Filed Weights   11/05/20 1009 11/05/20 1020  Weight: 91.6 kg 91.6 kg    History of present illness:   45 y.o. African-American male with medical history significant for migraine, ongoing tobacco abuse and recent right fifth toe cellulitis and osteomyelitis.  He's returned to the ED due to recommendation from his PCP after initially leaving the ED on 10/25 AMA.    He's now s/p R 5th toe amputation.  Plan for discharge with abx and outpatient podiatry follow up.  Hospital Course:  1.  Cellulitis and osteomyelitis of the right fifth toe -- s/p R 5th toe amputation -- follow pending surgical cultures and pathology - per discussion with podiatry, suspect no residual osteo, but will send with abx for soft tissue infxn -- need to follow final results of surgical cultures and path -- d/c with augmentin  -- will repeat MRI R foot -> with acute osteo of proximal and middle phalanges of small toe, likely septic proximal IP joint - surrounding cellulitis, tracking back into dorsum of foot - possible early local myositis -- CRP, ESR not that elevated -- palpable DP pulse -- 11/6 wound cx with normal skin flora -- surgical cultures from 11/7 pending --- few  gram positive cocci -- weight bearing to heel   # Rash - seems to have improved after benadryl  - unclear etiology, has received and tolerated vanc/cefepime before - vanc now d/c'd - follow outpatient   2.  Ongoing tobacco abuse. - nicotine patch, nicotine gum -- NRT at discharge, discussed importance of quitting   # Nucleated Porokeratotic Skin Lesions -- podiatry planning to pair in OR  3.  History of migraine. - no current headaches.  Procedures: Right fifth toe amputation  Consultations: podiatry  Discharge Exam: Vitals:   11/08/20 1059 11/08/20 1459  BP: 134/80 (!) 153/98  Pulse: 80 70  Resp: 16 16  Temp: 98.2 F (36.8 C) 98.4 F (36.9 C)  SpO2: 99% 99%   Eager to discharge No complaints  General: No acute distress. Cardiovascular: RRR Lungs:unlabored Abdomen: Soft, nontender, nondistended  Neurological: Alert and oriented 3. Moves all extremities 4 . Cranial nerves II through XII grossly intact. Skin: Warm and dry. No rashes or lesions. Extremities: RLE with intact dressing  Discharge Instructions   Discharge Instructions     Call MD for:  difficulty breathing, headache or visual disturbances   Complete by: As directed    Call MD for:  extreme fatigue   Complete by: As directed    Call MD for:  hives   Complete by: As directed    Call MD for:  persistant dizziness or light-headedness   Complete by: As directed    Call MD for:  persistant nausea and vomiting   Complete by: As directed    Call MD for:  redness, tenderness, or  signs of infection (pain, swelling, redness, odor or green/yellow discharge around incision site)   Complete by: As directed    Call MD for:  severe uncontrolled pain   Complete by: As directed    Call MD for:  temperature >100.4   Complete by: As directed    Diet - low sodium heart healthy   Complete by: As directed    Discharge instructions   Complete by: As directed    You were seen for Edward Owens bone infection.  This  was treated with amputation.  We'll send you with 10 days of antibiotics.  You should follow up with podiatry within about Edward Owens week.  You can weight bear on the heel only.    We'll send pain medicines to use as needed for post op pain.  Try to use this only for pain not resolved with tylenol or ibuprofen.   Smoking cessation is extremely important.  I'll send prescriptions for the nicotine patches and gum.  Smoking can affect wound healing and put you at further risk of infection.  Wellbutrin and chantix are other options that can help with smoking cessation if the nicotine replacement alone isn't helping.  You had an allergic reaction during your hospitalization, but we're not sure what it was to.  Follow up with your outpatient provider and consider follow up with allergy doctors outpatient if you continue to have issues.  Your blood sugars were high this morning due to steroids you received yesterday.  Follow this up with your PCP, ask for an A1c test.  Return for new, recurrent, or worsening symptoms.  Please ask your PCP to request records from this hospitalization so they know what was done and what the next steps will be.   Discharge wound care:   Complete by: As directed    Per podiatry   Increase activity slowly   Complete by: As directed       Allergies as of 11/08/2020   No Known Allergies      Medication List     STOP taking these medications    doxycycline 100 MG tablet Commonly known as: VIBRA-TABS   levofloxacin 750 MG tablet Commonly known as: LEVAQUIN       TAKE these medications    amoxicillin-clavulanate 875-125 MG tablet Commonly known as: Augmentin Take 1 tablet by mouth 2 (two) times daily for 10 days.   ibuprofen 400 MG tablet Commonly known as: ADVIL Take 1 tablet (400 mg total) by mouth every 6 (six) hours as needed. What changed:  medication strength how much to take   nicotine 21 mg/24hr patch Commonly known as: NICODERM CQ - dosed in  mg/24 hours Place 1 patch (21 mg total) onto the skin daily. Start taking on: November 09, 2020   nicotine polacrilex 2 MG gum Commonly known as: NICORETTE Take 1 each (2 mg total) by mouth as needed for smoking cessation.   oxyCODONE 5 MG immediate release tablet Commonly known as: Oxy IR/ROXICODONE Take 1 tablet (5 mg total) by mouth every 6 (six) hours as needed for up to 5 days for moderate pain.               Durable Medical Equipment  (From admission, onward)           Start     Ordered   11/08/20 1630  DME Walker  Once       Question Answer Comment  Walker: With 5 Inch Wheels   Patient needs Kayd Launer walker  to treat with the following condition Osteomyelitis [facility])      11/08/20 1642   11/08/20 1334  For home use only DME Walker rolling  Once       Question Answer Comment  Walker: With [place]   Patient needs Zong Mcquarrie walker to treat with the following condition Weight loss, non-intentional      11/08/20 1334              Discharge Care Instructions  (From admission, onward)           Start     Ordered   11/08/20 0000  Discharge wound care:       Comments: Per podiatry   11/08/20 1642           No Known Allergies  Follow-up Information     Caroline More, DPM. Schedule an appointment as soon as possible for Edward Owens visit in 1 week(s).   Specialty: Podiatry Contact information: [place] [phone]                  The results of significant diagnostics from this hospitalization (including imaging, microbiology, ancillary and laboratory) are listed below for reference.    Significant Diagnostic Studies: MR FOOT RIGHT WO CONTRAST  Result Date: 11/06/2020 CLINICAL DATA:  Worsening right fifth toe swelling and pain. Osteomyelitis of the fifth toe shown on MRI from 10/25/2020. EXAM: MRI OF THE RIGHT FOREFOOT WITHOUT CONTRAST TECHNIQUE: Multiplanar, multisequence MR imaging of the right forefoot was  performed. No intravenous contrast was administered. COMPARISON:  10/25/2020 and radiographs from 11/05/2020 FINDINGS: Despite efforts by the technologist and patient, motion artifact is present on today's exam and could not be eliminated. This reduces exam sensitivity and specificity. Bones/Joint/Cartilage Diffuse abnormal osseous edema in the proximal and middle phalanges of the small toe with an effusion the proximal interphalangeal joint (image 5, series 9) similar to that shown on 10/25/2020. Appearance is compatible with acute osteomyelitis and quite likely septic proximal interphalangeal joint. Ligaments Lisfranc ligament appears intact. Muscles and Tendons Trace edema along the interosseous musculature between the distal fourth and fifth metatarsals, mildly increased from 10/25/2020 and potentially reflecting local myositis. Soft tissues Substantial cellulitis in the fifth toe. Edema tracks back along the dorsal forefoot, potentially also reflecting cellulitis. This is overall similar to prior. IMPRESSION: 1. Acute osteomyelitis of the proximal and middle phalanges of the small toe, likely septic proximal interphalangeal joint. Surrounding cellulitis, tracking back into the dorsum of foot. The only substantial change from 10/25/2020 is that there is slightly greater edema tracking along the interosseous musculature between the distal fourth and fifth metatarsals which may reflect early local myositis. Electronically Signed   By: Van Clines M.D.   On: 11/06/2020 12:16   MR FOOT RIGHT W WO CONTRAST  Result Date: 10/25/2020 CLINICAL DATA:  Abnormal radiograph, suspected osteomyelitis EXAM: MRI OF THE RIGHT FOREFOOT WITHOUT AND WITH CONTRAST TECHNIQUE: Multiplanar, multisequence MR imaging of the foot was performed before and after the administration of intravenous contrast. CONTRAST:  71m GADAVIST GADOBUTROL 1 MMOL/ML IV SOLN COMPARISON:  Radiograph dated October 25, 2023 FINDINGS:  Bones/Joint/Cartilage Bone marrow edema with diffuse enhancement on post-contrast in the proximal and mid phalanx of the fifth digit. Ligaments Intact Muscles and Tendons Plantar muscles are normal in size and signal. Visualized tendons are intact Soft tissues Subcutaneous soft tissue edema prominent about the fifth digit IMPRESSION: Abnormal marrow signal in the proximal and middle phalanges of the fifth  digit. In the presence of adjacent skin wound findings are suspicious for acute osteomyelitis. Differential includes nondisplaced fracture, correlate with recent history of trauma. Prominent subcutaneous soft tissue edema about the fifth digit Electronically Signed   By: Keane Police D.O.   On: 10/25/2020 16:36   DG Foot Complete Right  Result Date: 11/05/2020 CLINICAL DATA:  Right foot pain and cellulitis. EXAM: RIGHT FOOT COMPLETE - 3+ VIEW COMPARISON:  10/24/2020 FINDINGS: No evidence of fracture. Mild degenerative changes and subluxation is again seen involving the PIP joint of the little toe. Little toe soft tissue swelling is seen, however there is no evidence of osteolysis or periostitis. IMPRESSION: Little toe soft tissue swelling. No radiographic evidence of osteomyelitis. Mild degenerative changes and subluxation of little toe PIP joint, without significant change. Electronically Signed   By: Marlaine Hind M.D.   On: 11/05/2020 12:04   DG Toe 5th Right  Result Date: 10/24/2020 CLINICAL DATA:  Toe injury dark in color EXAM: RIGHT FIFTH TOE COMPARISON:  None. FINDINGS: No definitive fracture is seen. Soft tissue swelling without emphysema. Questionable small erosion on the lateral side at the fifth PIP joint. Question resorptive change at the tuft of the fifth distal phalanx. IMPRESSION: 1. Possible mild resorptive change at the fifth distal phalanx tuft with possible subtle erosion at fifth IP joint, osteomyelitis cannot be excluded. Consider correlation with MRI. Electronically Signed   By: Donavan Foil M.D.   On: 10/24/2020 22:17    Microbiology: Recent Results (from the past 240 hour(s))  Culture, blood (Routine X 2) w Reflex to ID Panel     Status: None (Preliminary result)   Collection Time: 11/06/20  1:40 AM   Specimen: BLOOD  Result Value Ref Range Status   Specimen Description BLOOD RFA  Final   Special Requests   Final    BOTTLES DRAWN AEROBIC AND ANAEROBIC Blood Culture adequate volume   Culture   Final    NO GROWTH 2 DAYS Performed at [facility], [address] [place], [place] [address]    Report Status PENDING  Incomplete  Culture, blood (Routine X 2) w Reflex to ID Panel     Status: None (Preliminary result)   Collection Time: 11/06/20  1:40 AM   Specimen: BLOOD  Result Value Ref Range Status   Specimen Description BLOOD RFA  Final   Special Requests   Final    BOTTLES DRAWN AEROBIC AND ANAEROBIC Blood Culture results may not be optimal due to an inadequate volume of blood received in culture bottles   Culture   Final    NO GROWTH 2 DAYS Performed at [facility], [address] [place], [place] [address]    Report Status PENDING  Incomplete  Resp Panel by RT-PCR (Flu Macoy Rodwell&B, Covid) Nasopharyngeal Swab     Status: None   Collection Time: 11/06/20  1:55 AM   Specimen: Nasopharyngeal Swab; Nasopharyngeal(NP) swabs in vial transport medium  Result Value Ref Range Status   SARS Coronavirus 2 by RT PCR NEGATIVE NEGATIVE Final    Comment: (NOTE) SARS-CoV-2 target nucleic acids are NOT DETECTED.  The SARS-CoV-2 RNA is generally detectable in upper respiratory specimens during the acute phase of infection. The lowest concentration of SARS-CoV-2 viral copies this assay can detect is 138 copies/mL. Edward Owens negative result does not preclude SARS-Cov-2 infection and should not be used as the sole basis for treatment or other patient management decisions. Edward Owens negative result may occur with  improper specimen collection/handling,  submission of specimen  other than nasopharyngeal swab, presence of viral mutation(s) within the areas targeted by this assay, and inadequate number of viral copies(<138 copies/mL). Edward Owens negative result must be combined with clinical observations, patient history, and epidemiological information. The expected result is Negative.  Fact Sheet for Patients:  EntrepreneurPulse.com.au  Fact Sheet for Healthcare Providers:  IncredibleEmployment.be  This test is no t yet approved or cleared by the [place] FDA and  has been authorized for detection and/or diagnosis of SARS-CoV-2 by FDA under an Emergency Use Authorization (EUA). This EUA will remain  in effect (meaning this test can be used) for the duration of the COVID-19 declaration under Section 564(b)(1) of the Act, 21 U.S.C.section 360bbb-3(b)(1), unless the authorization is terminated  or revoked sooner.       Influenza Edward Owens by PCR NEGATIVE NEGATIVE Final   Influenza B by PCR NEGATIVE NEGATIVE Final    Comment: (NOTE) The Xpert Xpress SARS-CoV-2/FLU/RSV plus assay is intended as an aid in the diagnosis of influenza from Nasopharyngeal swab specimens and should not be used as Edward Owens sole basis for treatment. Nasal washings and aspirates are unacceptable for Xpert Xpress SARS-CoV-2/FLU/RSV testing.  Fact Sheet for Patients: EntrepreneurPulse.com.au  Fact Sheet for Healthcare Providers: IncredibleEmployment.be  This test is not yet approved or cleared by the [place] FDA and has been authorized for detection and/or diagnosis of SARS-CoV-2 by FDA under an Emergency Use Authorization (EUA). This EUA will remain in effect (meaning this test can be used) for the duration of the COVID-19 declaration under Section 564(b)(1) of the Act, 21 U.S.C. section 360bbb-3(b)(1), unless the authorization is terminated or revoked.  Performed at [facility], [address]  [place], [place] [address]   Aerobic/Anaerobic Culture w Gram Stain (surgical/deep wound)     Status: None (Preliminary result)   Collection Time: 11/06/20  3:24 PM   Specimen: Toe; Wound  Result Value Ref Range Status   Specimen Description   Final    TOE R Performed at [facility], [address], [place], [place] [address]    Special Requests   Final    NONE Performed at [facility], [address] [place], [place] [address]    Gram Stain   Final    NO WBC SEEN MODERATE GRAM POSITIVE RODS RARE GRAM POSITIVE COCCI Performed at [facility], [place] [address] [place], [place] [address]    Culture   Final    ABUNDANT NORMAL SKIN FLORA NO ANAEROBES ISOLATED; CULTURE IN PROGRESS FOR 5 DAYS    Report Status PENDING  Incomplete  Aerobic/Anaerobic Culture w Gram Stain (surgical/deep wound)     Status: None (Preliminary result)   Collection Time: 11/07/20  8:09 PM   Specimen: PATH Other; Wound  Result Value Ref Range Status   Specimen Description   Final    WOUND RIGHT 5TH TOE Performed at [facility], [address] [place], [place] [address]    Special Requests   Final    NONE Performed at [facility], [place],  [address]    Gram Stain   Final    NO SQUAMOUS EPITHELIAL CELLS SEEN FEW WBC SEEN NO ORGANISMS SEEN Performed at [facility], [place] [address], [place],  [address]    Culture PENDING  Incomplete   Report Status PENDING  Incomplete  Aerobic/Anaerobic Culture w Gram Stain (surgical/deep wound)     Status: None (Preliminary result)   Collection  Time: 11/07/20  8:11 PM   Specimen: PATH Bone biopsy; Tissue  Result Value Ref Range Status   Specimen Description   Final    TOE RIGHT 5TH TOE BONE Performed at [facility], [address], [place], [place] [address]    Special Requests   Final    NONE Performed at [facility], [address] [place], [place]  [address]    Gram Stain   Final    NO SQUAMOUS EPITHELIAL CELLS SEEN NO WBC SEEN FEW GRAM POSITIVE COCCI Performed at [facility], [place] [address] [place], [place] [address]    Culture PENDING  Incomplete   Report Status PENDING  Incomplete     Labs: Basic Metabolic Panel: Recent Labs  Lab 11/05/20 1035 11/06/20 0140 11/07/20 0543 11/08/20 0639  NA 133* 139 139 137  K 3.6 4.1 4.2 3.9  CL 102 107 108 104  CO2 _0 21*  GLUCOSE 164* 102* 89 165*  BUN _1 CREATININE 1.20 1.10 1.06 1.12  CALCIUM 9.2 9.4 9.2 9.6  MG  --   --  2.0 2.1  PHOS  --   --  2.7 3.3   Liver Function Tests: Recent Labs  Lab 11/05/20 1035 11/07/20 0543 11/08/20 0639  AST _2 ALT 33 26 27  ALKPHOS 72 72 73  BILITOT 0.8 0.8 0.7  PROT 7.9 7.1 7.5  ALBUMIN 4.2 3.5 4.1   No results for input(s): LIPASE, AMYLASE in the last 168 hours. No results for input(s): AMMONIA in the last 168 hours. CBC: Recent Labs  Lab 11/05/20 1035 11/06/20 0140 11/07/20 0543 11/08/20 0639  WBC 8.9 12.7* 11.3* 14.2*  NEUTROABS 6.0  --  9.1* 13.2*  HGB 14.1 14.0 13.9 14.0  HCT 42.1 41.2 42.0 41.8  MCV 94.4 94.9 93.8 93.3  PLT 177 174 175 186   Cardiac Enzymes: No results for input(s): CKTOTAL, CKMB, CKMBINDEX, TROPONINI in the last 168 hours. BNP: BNP (last 3 results) No results for input(s): BNP in the last 8760 hours.  ProBNP (last 3 results) No results for input(s): PROBNP in the last 8760 hours.  CBG: No results for input(s): GLUCAP in the last 168 hours.     Signed:  Fayrene Helper MD.  [facility] 11/08/2020, 4:42 PM

## 2020-11-10 LAB — SURGICAL PATHOLOGY

## 2020-11-11 LAB — AEROBIC/ANAEROBIC CULTURE W GRAM STAIN (SURGICAL/DEEP WOUND)
Culture: NORMAL
Gram Stain: NONE SEEN

## 2020-11-11 LAB — CULTURE, BLOOD (ROUTINE X 2)
Culture: NO GROWTH
Culture: NO GROWTH
Special Requests: ADEQUATE

## 2020-11-13 LAB — AEROBIC/ANAEROBIC CULTURE W GRAM STAIN (SURGICAL/DEEP WOUND)
Culture: NORMAL
Gram Stain: NONE SEEN
Gram Stain: NONE SEEN

## 2021-08-19 ENCOUNTER — Other Ambulatory Visit: Payer: Self-pay

## 2021-08-19 ENCOUNTER — Encounter: Payer: Self-pay | Admitting: Emergency Medicine

## 2021-08-19 ENCOUNTER — Emergency Department
Admission: EM | Admit: 2021-08-19 | Discharge: 2021-08-19 | Disposition: A | Discharge status: Home / Self Care | Payer: Self-pay | Attending: Emergency Medicine | Admitting: Emergency Medicine

## 2021-08-19 ENCOUNTER — Emergency Department: Payer: Self-pay

## 2021-08-19 DIAGNOSIS — M25462 Effusion, left knee: Secondary | ICD-10-CM | POA: Insufficient documentation

## 2021-08-19 DIAGNOSIS — M25562 Pain in left knee: Secondary | ICD-10-CM | POA: Diagnosis present

## 2021-08-19 MED ORDER — MELOXICAM 15 MG PO TABS: 15.0000 mg | ORAL_TABLET | Freq: Every day | ORAL | 2 refills | Status: AC

## 2021-08-19 NOTE — ED Provider Notes (Signed)
[facility] Provider Note    Event Date/Time   First MD Initiated Contact with Patient 08/19/21 1057     (approximate)   History   Knee Pain   HPI  Edward Owens is a 46 y.o. male with no significant past medical history presents emergency department with complaints of left knee pain.  Patient states he feels popping and clicking.  Has been putting more weight on the left knee as he had a right fifth toe amputation about a year ago.  He denies any actual known injury.  No fall.  States occasionally he gets swelling in the knee.  No numbness or tingling      Physical Exam   Triage Vital Signs: ED Triage Vitals  Enc Vitals Group     BP 08/19/21 0954 (!) 145/90     Pulse Rate 08/19/21 0954 81     Resp 08/19/21 0954 17     Temp 08/19/21 0954 98.2 F (36.8 C)     Temp Source 08/19/21 0954 Oral     SpO2 08/19/21 0954 96 %     Weight 08/19/21 0949 180 lb (81.6 kg)     Height 08/19/21 0949 5\' 9"  (1.753 m)     Head Circumference --      Peak Flow --      Pain Score 08/19/21 0949 8     Pain Loc --      Pain Edu? --      Excl. in GC? --     Most recent vital signs: Vitals:   08/19/21 0954  BP: (!) 145/90  Pulse: 81  Resp: 17  Temp: 98.2 F (36.8 C)  SpO2: 96%     General: Awake, no distress.   CV:  Good peripheral perfusion. regular rate and  rhythm Resp:  Normal effort. Abd:  No distention.   Other:  Left knee is tender at the joint line, tender posteriorly, small amount of swelling noted posteriorly which could possibly be a Baker's cyst, no redness, no calf tenderness, ligaments appear to be intact, neurovascular intact   ED Results / Procedures / Treatments   Labs (all labs ordered are listed, but only abnormal results are displayed) Labs Reviewed - No data to display   EKG     RADIOLOGY X-ray of the left knee    PROCEDURES:   Procedures   MEDICATIONS ORDERED IN ED: Medications - No data to  display   IMPRESSION / MDM / ASSESSMENT AND PLAN / ED COURSE  I reviewed the triage vital signs and the nursing notes.                              Differential diagnosis includes, but is not limited to, sprain strain fracture, meniscus tear, ligamentous injury  Patient's presentation is most consistent with acute complicated illness / injury requiring diagnostic workup.   X-ray of the left knee individually reviewed and interpreted by me as being negative  I did explain these findings to the patient.  We will place him in a knee brace for comfort.  Crutches to use when the knee is swollen.  Follow-up with orthopedics.  Placed on meloxicam daily.  He is to apply ice as needed.  Return if worsening.  Discharged in stable condition.      FINAL CLINICAL IMPRESSION(S) / ED DIAGNOSES   Final diagnoses:  Acute pain of left knee     Rx /  DC Orders   ED Discharge Orders          Ordered    meloxicam (MOBIC) 15 MG tablet  Daily        08/19/21 1151             Note:  This document was prepared using Dragon voice recognition software and may include unintentional dictation errors.    Faythe Ghee, PA-C 08/19/21 1207    Shaune Pollack, MD 08/20/21 [phone]

## 2021-08-19 NOTE — ED Triage Notes (Signed)
Pt via POV from home. Pt c/o L knee pain and swelling that started 4 days ago. States that he does a lot of walking and puts a lot of pressure. Pt is A&Ox4 and NAD. Ambulatory to triage.

## 2021-08-19 NOTE — Discharge Instructions (Signed)
Follow-up with orthopedics.  Please call for an appointment.  Wear the knee brace for support.  Take the anti-inflammatory daily.  Use crutches as needed when having severe pain in the knee.  Return emergency department for worsening

## 2021-12-06 ENCOUNTER — Emergency Department
Admission: EM | Admit: 2021-12-06 | Discharge: 2021-12-06 | Disposition: A | Discharge status: Home / Self Care | Payer: No Typology Code available for payment source | Attending: Emergency Medicine | Admitting: Emergency Medicine

## 2021-12-06 ENCOUNTER — Emergency Department: Payer: No Typology Code available for payment source

## 2021-12-06 ENCOUNTER — Other Ambulatory Visit: Payer: Self-pay

## 2021-12-06 DIAGNOSIS — L97519 Non-pressure chronic ulcer of other part of right foot with unspecified severity: Secondary | ICD-10-CM | POA: Insufficient documentation

## 2021-12-06 DIAGNOSIS — M79671 Pain in right foot: Secondary | ICD-10-CM | POA: Diagnosis present

## 2021-12-06 MED ORDER — CLINDAMYCIN HCL 300 MG PO CAPS: 300.0000 mg | ORAL_CAPSULE | Freq: Three times a day (TID) | ORAL | 0 refills | Status: AC

## 2021-12-06 NOTE — ED Triage Notes (Signed)
Pt comes with c/o right 4th digit toe pain that started two days ago.

## 2021-12-06 NOTE — Discharge Instructions (Signed)
As we discussed please keep your foot dry, use gauze in between your third and fourth toes.  Take your antibiotics as prescribed for the next 10 days.  Return to the emergency department for any signs of worsening infection such as redness swelling drainage or fever.  Otherwise please follow-up with a primary care doctor within the next week or so for recheck.

## 2021-12-06 NOTE — ED Provider Notes (Signed)
[facility] Provider Note    Event Date/Time   First MD Initiated Contact with Patient 12/06/21 [phone]     (approximate)  History   Chief Complaint: Foot Pain  HPI  Edward Owens is a 46 y.o. male with no significant past medical history presents to the emergency department for right fourth toe evaluation.  According to the patient several years ago he had a small infection of his left fifth toe that ended up requiring an amputation as it worsened.  He states over the last 2 days he has noticed a small ulceration or area of infection to the fourth toe and wanted to get evaluated to ensure that the infection did not get worse.  Patient states mild discomfort.  No fever.  Physical Exam   Triage Vital Signs: ED Triage Vitals  Enc Vitals Group     BP 12/06/21 0854 (!) 156/101     Pulse Rate 12/06/21 0854 86     Resp 12/06/21 0854 18     Temp 12/06/21 0854 97.9 F (36.6 C)     Temp Source 12/06/21 0854 Oral     SpO2 12/06/21 0854 100 %     Weight 12/06/21 0855 205 lb (93 kg)     Height 12/06/21 0855 5\' 9"  (1.753 m)     Head Circumference --      Peak Flow --      Pain Score 12/06/21 0852 7     Pain Loc --      Pain Edu? --      Excl. in GC? --     Most recent vital signs: Vitals:   12/06/21 0854  BP: (!) 156/101  Pulse: 86  Resp: 18  Temp: 97.9 F (36.6 C)  SpO2: 100%    General: Awake, no distress.  CV:  Good peripheral perfusion.  Regular rate and rhythm  Resp:  Normal effort.  Equal breath sounds bilaterally.  Abd:  No distention.  Soft, nontender.  No rebound or guarding. Other:  In the right lower extremity patient has a very small possibly developing ulceration in between his third and fourth digits.  Prior fifth digit amputation.   ED Results / Procedures / Treatments   RADIOLOGY  I have reviewed and interpreted the x-ray images I do not see any obvious osteomyelitis or other concerning findings on the x-ray.   MEDICATIONS  ORDERED IN ED: Medications - No data to display   IMPRESSION / MDM / ASSESSMENT AND PLAN / ED COURSE  I reviewed the triage vital signs and the nursing notes.  Patient's presentation is most consistent with acute illness / injury with system symptoms.  Patient presents emergency department for evaluation of his right fourth toe.  Patient has a very small area of possible developing ulceration although no current ulcer in between the third and fourth digit.  X-ray does not appear to show any gas or signs of osteomyelitis.  Discussed with the patient using gauze in between the toes to keep them dry as well as a short course of antibiotics.  We will prescribe clindamycin 3 times daily for the next 7 days have the patient follow-up with his doctor for recheck.  Discussed return precautions should this worsen.  FINAL CLINICAL IMPRESSION(S) / ED DIAGNOSES   Toe ulceration  Rx / DC Orders   Clindamycin  Note:  This document was prepared using Dragon voice recognition software and may include unintentional dictation errors.   14/06/23, MD 12/06/21  0942  

## 2021-12-22 ENCOUNTER — Emergency Department
Admission: EM | Admit: 2021-12-22 | Discharge: 2021-12-22 | Disposition: A | Discharge status: Home / Self Care | Payer: PRIVATE HEALTH INSURANCE | Attending: Emergency Medicine | Admitting: Emergency Medicine

## 2021-12-22 ENCOUNTER — Emergency Department: Payer: PRIVATE HEALTH INSURANCE

## 2021-12-22 ENCOUNTER — Other Ambulatory Visit: Payer: Self-pay

## 2021-12-22 DIAGNOSIS — G43001 Migraine without aura, not intractable, with status migrainosus: Secondary | ICD-10-CM

## 2021-12-22 DIAGNOSIS — R519 Headache, unspecified: Secondary | ICD-10-CM | POA: Diagnosis present

## 2021-12-22 DIAGNOSIS — L97511 Non-pressure chronic ulcer of other part of right foot limited to breakdown of skin: Secondary | ICD-10-CM | POA: Insufficient documentation

## 2021-12-22 DIAGNOSIS — L97501 Non-pressure chronic ulcer of other part of unspecified foot limited to breakdown of skin: Secondary | ICD-10-CM

## 2021-12-22 LAB — COMPREHENSIVE METABOLIC PANEL
ALT: 40 U/L (ref 0–44)
AST: 25 U/L (ref 15–41)
Albumin: 3.5 g/dL (ref 3.5–5.0)
Alkaline Phosphatase: 64 U/L (ref 38–126)
Anion gap: 7 (ref 5–15)
BUN: 13 mg/dL (ref 6–20)
CO2: 22 mmol/L (ref 22–32)
Calcium: 8.5 mg/dL — ABNORMAL LOW (ref 8.9–10.3)
Chloride: 111 mmol/L (ref 98–111)
Creatinine, Ser: 1.02 mg/dL (ref 0.61–1.24)
GFR, Estimated: >60 mL/min (ref >60)
Glucose, Bld: 100 mg/dL — ABNORMAL HIGH (ref 70–99)
Potassium: 3.6 mmol/L (ref 3.5–5.1)
Sodium: 140 mmol/L (ref 135–145)
Total Bilirubin: 0.8 mg/dL (ref 0.3–1.2)
Total Protein: 6.7 g/dL (ref 6.5–8.1)

## 2021-12-22 LAB — CBC WITH DIFFERENTIAL/PLATELET
Abs Immature Granulocytes: 0.02 10*3/uL (ref 0.00–0.07)
Basophils Absolute: 0 10*3/uL (ref 0.0–0.1)
Basophils Relative: 0 %
Eosinophils Absolute: 0.1 10*3/uL (ref 0.0–0.5)
Eosinophils Relative: 1 %
HCT: 38.1 % — ABNORMAL LOW (ref 39.0–52.0)
Hemoglobin: 12.8 g/dL — ABNORMAL LOW (ref 13.0–17.0)
Immature Granulocytes: 0 %
Lymphocytes Relative: 38 %
Lymphs Abs: 3.7 10*3/uL (ref 0.7–4.0)
MCH: 31 pg (ref 26.0–34.0)
MCHC: 33.6 g/dL (ref 30.0–36.0)
MCV: 92.3 fL (ref 80.0–100.0)
Monocytes Absolute: 0.7 10*3/uL (ref 0.1–1.0)
Monocytes Relative: 7 %
Neutro Abs: 5.2 10*3/uL (ref 1.7–7.7)
Neutrophils Relative %: 54 %
Platelets: 165 10*3/uL (ref 150–400)
RBC: 4.13 MIL/uL — ABNORMAL LOW (ref 4.22–5.81)
RDW: 14.5 % (ref 11.5–15.5)
WBC: 9.8 10*3/uL (ref 4.0–10.5)
nRBC: 0 % (ref 0.0–0.2)

## 2021-12-22 MED ORDER — KETOROLAC TROMETHAMINE 30 MG/ML IJ SOLN
30.0000 mg | Freq: Once | INTRAMUSCULAR | Status: AC
  Administered 2021-12-22: 30 mg via INTRAVENOUS
  Filled 2021-12-22: qty 1

## 2021-12-22 MED ORDER — DIPHENHYDRAMINE HCL 50 MG/ML IJ SOLN
25.0000 mg | Freq: Once | INTRAMUSCULAR | Status: AC
  Administered 2021-12-22: 25 mg via INTRAVENOUS
  Filled 2021-12-22: qty 1

## 2021-12-22 MED ORDER — SODIUM CHLORIDE 0.9 % IV BOLUS
1000.0000 mL | Freq: Once | INTRAVENOUS | Status: AC
  Administered 2021-12-22: 1000 mL via INTRAVENOUS

## 2021-12-22 MED ORDER — CEPHALEXIN 500 MG PO CAPS: 500.0000 mg | ORAL_CAPSULE | Freq: Three times a day (TID) | ORAL | 0 refills | Status: AC

## 2021-12-22 MED ORDER — ONDANSETRON HCL 4 MG/2ML IJ SOLN
4.0000 mg | Freq: Once | INTRAMUSCULAR | Status: AC
  Administered 2021-12-22: 4 mg via INTRAVENOUS
  Filled 2021-12-22: qty 2

## 2021-12-22 NOTE — Discharge Instructions (Signed)
Follow-up with podiatry to assess the area between your toes.  Return emergency department worsening.  Finish the antibiotic that you were prescribed.

## 2021-12-22 NOTE — ED Notes (Signed)
Medications given. Warm blanket given. Family in room.

## 2021-12-22 NOTE — ED Notes (Signed)
See triage note Presents with migraine  States he has had headache for 3 weeks  States pain became worse yesterday  afebrile on arrival

## 2021-12-22 NOTE — ED Triage Notes (Addendum)
Reports has migraine that started 3 weeks ago and now it is in right eye.  Reports same headache as all his other migraines. No n/v.  Complains of dry mouth.  Usually receives migraines cocktail and gets better. SO of patient reports patient had toe amputated last year on right foot and has been having pain and has a sore but has been taking antibiotics but would like it rechecked.

## 2021-12-22 NOTE — ED Provider Notes (Signed)
[facility] Provider Note    Event Date/Time   First MD Initiated Contact with Patient 12/22/21 [phone]     (approximate)   History   Migraine and Foot Pain   HPI  Edward Owens is a 46 y.o. 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  Patient states he has had a lot of dry mouth, his friend states he has had frequent urination.  Family history of diabetes.  Patient states this migraine is typical of his normal migraines.  States normally they give him a migraine cocktail and he is much better.  Has had the headache for about 3 weeks.  States taking Tylenol and ibuprofen without any relief.  Denies fever or chills.      Physical Exam   Triage Vital Signs: ED Triage Vitals [12/22/21 0750]  Enc Vitals Group     BP (!) 139/98     Pulse Rate 84     Resp 16     Temp 98.3 F (36.8 C)     Temp Source Oral     SpO2      Weight 205 lb (93 kg)     Height 5\' 9"  (1.753 m)     Head Circumference      Peak Flow      Pain Score 7     Pain Loc      Pain Edu?      Excl. in GC?     Most recent vital signs: Vitals:   12/22/21 0750  BP: (!) 139/98  Pulse: 84  Resp: 16  Temp: 98.3 F (36.8 C)     General: Awake, no distress.   CV:  Good peripheral perfusion. regular rate and  rhythm Resp:  Normal effort.  Abd:  No distention.   Other:  Cranial nerves II through XII grossly intact, right foot with ulceration/black dead skin between the third and fourth toe, amputation of the right fifth toe area appears to be well-healed.  Neurovascular is intact   ED Results / Procedures / Treatments   Labs (all labs ordered are listed, but only abnormal results are displayed) Labs Reviewed  COMPREHENSIVE METABOLIC PANEL - Abnormal; Notable for the following components:      Result Value   Glucose, Bld 100 (*)    Calcium 8.5 (*)    All other components within  normal limits  CBC WITH DIFFERENTIAL/PLATELET - Abnormal; Notable for the following components:   RBC 4.13 (*)    Hemoglobin 12.8 (*)    HCT 38.1 (*)    All other components within normal limits     EKG     RADIOLOGY X-ray of the right foot    PROCEDURES:   Procedures   MEDICATIONS ORDERED IN ED: Medications  sodium chloride 0.9 % bolus 1,000 mL (0 mLs Intravenous Stopped 12/22/21 1049)  ketorolac (TORADOL) 30 MG/ML injection 30 mg (30 mg Intravenous Given 12/22/21 1011)  ondansetron (ZOFRAN) injection 4 mg (4 mg Intravenous Given 12/22/21 1013)  diphenhydrAMINE (BENADRYL) injection 25 mg (25 mg Intravenous Given 12/22/21 1010)     IMPRESSION / MDM / ASSESSMENT AND PLAN / ED COURSE  I reviewed the triage vital signs and the nursing notes.                              Differential diagnosis includes, but is  not limited to, migraine, osteomyelitis, cellulitis, diabetes  Patient's presentation is most consistent with acute presentation with potential threat to life or bodily function.   Due to the concerns of osteomyelitis/necrosis of the right foot we will do x-ray along with lab work.  Due to the migraine we will give him a migraine cocktail which will include normal saline 1 L IV, Toradol 30 mg IV, Zofran 4 mg IV and Benadryl 25 mg IV.  Lab work to assess for diabetes and cellulitis/osteomyelitis of the right foot   Labs are reassuring, x-ray of the right foot independently reviewed and interpreted by me as being negative for any acute abnormality  Did discuss findings with patient.  He is to follow-up with podiatry.  Follow-up with his regular doctor as needed.  Return if worsening.  He is to finish the antibiotic he was previously prescribed.  Was also given information on how to take care of his feet.   FINAL CLINICAL IMPRESSION(S) / ED DIAGNOSES   Final diagnoses:  Ulcer of foot, limited to breakdown of skin, unspecified laterality (HCC)  Migraine without  aura and with status migrainosus, not intractable     Rx / DC Orders   ED Discharge Orders     None        Note:  This document was prepared using Dragon voice recognition software and may include unintentional dictation errors.    Faythe Ghee, PA-C 12/22/21 1136    Chesley Noon, MD 12/22/21 [phone]
